# Patient Record
Sex: Female | Born: 1955
Health system: Southern US, Community
[De-identification: ages and names within clinical notes are randomized; demographics above are authoritative.]

## PROBLEM LIST (undated history)

## (undated) DIAGNOSIS — I10 Essential (primary) hypertension: Secondary | ICD-10-CM

## (undated) DIAGNOSIS — F419 Anxiety disorder, unspecified: Secondary | ICD-10-CM

## (undated) DIAGNOSIS — D259 Leiomyoma of uterus, unspecified: Secondary | ICD-10-CM

## (undated) DIAGNOSIS — T8859XA Other complications of anesthesia, initial encounter: Secondary | ICD-10-CM

## (undated) DIAGNOSIS — M797 Fibromyalgia: Secondary | ICD-10-CM

## (undated) DIAGNOSIS — K219 Gastro-esophageal reflux disease without esophagitis: Secondary | ICD-10-CM

## (undated) DIAGNOSIS — G5603 Carpal tunnel syndrome, bilateral upper limbs: Secondary | ICD-10-CM

## (undated) DIAGNOSIS — M1990 Unspecified osteoarthritis, unspecified site: Secondary | ICD-10-CM

## (undated) DIAGNOSIS — R51 Headache: Secondary | ICD-10-CM

## (undated) DIAGNOSIS — T4145XA Adverse effect of unspecified anesthetic, initial encounter: Secondary | ICD-10-CM

## (undated) DIAGNOSIS — F32A Depression, unspecified: Secondary | ICD-10-CM

## (undated) DIAGNOSIS — F329 Major depressive disorder, single episode, unspecified: Secondary | ICD-10-CM

## (undated) DIAGNOSIS — K56609 Unspecified intestinal obstruction, unspecified as to partial versus complete obstruction: Secondary | ICD-10-CM

## (undated) HISTORY — PX: CHOLECYSTECTOMY: SHX55

## (undated) HISTORY — DX: Unspecified intestinal obstruction, unspecified as to partial versus complete obstruction: K56.609

## (undated) HISTORY — DX: Essential (primary) hypertension: I10

## (undated) HISTORY — PX: HERNIA REPAIR: SHX51

## (undated) HISTORY — DX: Headache: R51

## (undated) HISTORY — PX: TUBAL LIGATION: SHX77

## (undated) HISTORY — DX: Leiomyoma of uterus, unspecified: D25.9

---
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
## 1988-10-07 HISTORY — PX: COLON RESECTION: SHX5231

## 1999-10-08 HISTORY — PX: GANGLION CYST EXCISION: SHX1691

## 1999-12-20 ENCOUNTER — Ambulatory Visit (HOSPITAL_COMMUNITY): Admission: RE | Admit: 1999-12-20 | Discharge: 1999-12-20 | Payer: Self-pay | Admitting: Pulmonary Disease

## 1999-12-20 ENCOUNTER — Encounter: Payer: Self-pay | Admitting: Pulmonary Disease

## 2000-04-16 ENCOUNTER — Encounter (INDEPENDENT_AMBULATORY_CARE_PROVIDER_SITE_OTHER): Payer: Self-pay | Admitting: Specialist

## 2000-04-16 ENCOUNTER — Ambulatory Visit (HOSPITAL_COMMUNITY): Admission: RE | Admit: 2000-04-16 | Discharge: 2000-04-16 | Payer: Self-pay | Admitting: Orthopedic Surgery

## 2001-01-29 ENCOUNTER — Emergency Department (HOSPITAL_COMMUNITY): Admission: EM | Admit: 2001-01-29 | Discharge: 2001-01-30 | Payer: Self-pay | Admitting: Emergency Medicine

## 2001-10-05 ENCOUNTER — Other Ambulatory Visit: Admission: RE | Admit: 2001-10-05 | Discharge: 2001-10-05 | Payer: Self-pay | Admitting: Gynecology

## 2002-04-10 ENCOUNTER — Encounter: Payer: Self-pay | Admitting: Emergency Medicine

## 2002-04-10 ENCOUNTER — Emergency Department (HOSPITAL_COMMUNITY): Admission: EM | Admit: 2002-04-10 | Discharge: 2002-04-10 | Payer: Self-pay | Admitting: Emergency Medicine

## 2003-07-23 ENCOUNTER — Emergency Department (HOSPITAL_COMMUNITY): Admission: EM | Admit: 2003-07-23 | Discharge: 2003-07-23 | Payer: Self-pay | Admitting: Emergency Medicine

## 2003-10-08 DIAGNOSIS — K56609 Unspecified intestinal obstruction, unspecified as to partial versus complete obstruction: Secondary | ICD-10-CM

## 2003-10-08 HISTORY — PX: OTHER SURGICAL HISTORY: SHX169

## 2003-10-08 HISTORY — DX: Unspecified intestinal obstruction, unspecified as to partial versus complete obstruction: K56.609

## 2004-02-29 ENCOUNTER — Other Ambulatory Visit: Admission: RE | Admit: 2004-02-29 | Discharge: 2004-02-29 | Payer: Self-pay | Admitting: Gynecology

## 2004-05-21 ENCOUNTER — Ambulatory Visit (HOSPITAL_BASED_OUTPATIENT_CLINIC_OR_DEPARTMENT_OTHER): Admission: RE | Admit: 2004-05-21 | Discharge: 2004-05-21 | Payer: Self-pay | Admitting: Gynecology

## 2004-05-21 ENCOUNTER — Ambulatory Visit (HOSPITAL_COMMUNITY): Admission: RE | Admit: 2004-05-21 | Discharge: 2004-05-21 | Payer: Self-pay | Admitting: Gynecology

## 2004-05-21 ENCOUNTER — Encounter (INDEPENDENT_AMBULATORY_CARE_PROVIDER_SITE_OTHER): Payer: Self-pay | Admitting: Specialist

## 2004-08-21 ENCOUNTER — Encounter: Admission: RE | Admit: 2004-08-21 | Discharge: 2004-08-21 | Payer: Self-pay | Admitting: Family Medicine

## 2005-10-21 ENCOUNTER — Other Ambulatory Visit: Admission: RE | Admit: 2005-10-21 | Discharge: 2005-10-21 | Payer: Self-pay | Admitting: Gynecology

## 2006-04-14 ENCOUNTER — Other Ambulatory Visit: Admission: RE | Admit: 2006-04-14 | Discharge: 2006-04-14 | Payer: Self-pay | Admitting: Gynecology

## 2006-11-26 ENCOUNTER — Emergency Department (HOSPITAL_COMMUNITY): Admission: EM | Admit: 2006-11-26 | Discharge: 2006-11-26 | Payer: Self-pay | Admitting: Emergency Medicine

## 2007-01-29 ENCOUNTER — Other Ambulatory Visit: Admission: RE | Admit: 2007-01-29 | Discharge: 2007-01-29 | Payer: Self-pay | Admitting: Gynecology

## 2009-02-13 ENCOUNTER — Ambulatory Visit (HOSPITAL_COMMUNITY): Admission: RE | Admit: 2009-02-13 | Discharge: 2009-02-13 | Payer: Self-pay | Admitting: Gynecology

## 2010-10-27 ENCOUNTER — Encounter: Payer: Self-pay | Admitting: Family Medicine

## 2011-02-18 ENCOUNTER — Other Ambulatory Visit: Payer: Self-pay | Admitting: Gynecology

## 2011-02-22 NOTE — Op Note (Signed)
NAME:  Diane [name], Diane [name]                     ACCOUNT NO.:  [id number]   MEDICAL RECORD NO.:  [id number]                   PATIENT TYPE:  AMB   LOCATION:  [facility]                                 FACILITY:  [facility]   PHYSICIAN:  Gretta Cool, M.D.              DATE OF BIRTH:  07/13/1956   DATE OF PROCEDURE:  DATE OF DISCHARGE:                                 OPERATIVE REPORT   PREOPERATIVE DIAGNOSES:  1. Multiple uterine leiomyomata with severe abnormal uterine bleeding.  2. Poor candidate for abdominal surgery with a history of repetitive bowel     obstruction from multiple previous procedures.   POSTOPERATIVE DIAGNOSES:  1. Multiple uterine leiomyomata with severe abnormal uterine bleeding.  2. Poor candidate for abdominal surgery with a history of repetitive bowel     obstruction from multiple previous procedures.   PROCEDURES:  1. Hysteroscopy.  2. Resection of multiple uterine leiomyomata, submucous.  3. Total endometrial resection for ablation.   SURGEON:  Gretta Cool, M.D.   ANESTHESIA:  IV sedation and paracervical block.   DESCRIPTION OF PROCEDURE:  Under excellent anesthesia as above with the  patient prepped and draped in Allen stirrups, the cervix was grasped with a  single-tooth tenaculum and then the cervix was dilated to 7 mm.  The  resectoscope was then introduced and the endometrial cavity examined.  There  were significant polyps in the apex of the fundus.  The fibroids that were  noted by ultrasound were difficult to visualize.  Once the resection of the  endometrium was undertaken, the fibroids were unroofed and the fibroids  completely enucleated from the capsule.  At least four different fibroids  were identified submucous and encroaching the cavity.  Once the entire  endometrial cavity was resected with the resectoscope loop, the cavity was  treated with VaporTrode.  The entire endometrial cavity was resected, but  the cavity was so enormous it was  difficult to visualize all areas,  particularly after resection of multiple fibroids.  At this point, the  pressure was reduced and bleeding was controlled at reduced pressure.  At  this point, the procedure was terminated without complication.  The patient  returned to the recovery room in excellent condition.                                               Gretta Cool, M.D.    CWL/MEDQ  D:  05/21/2004  T:  05/21/2004  Job:  [id number]

## 2011-02-22 NOTE — Op Note (Signed)
[facility]  Patient:    Diane [name], Diane [name]                  MRN: [id number] Proc. Date: 04/16/00 Adm. Date:  [id number] Disc. Date: [id number] Attending:  Marlowe Kays Page                           Operative Report  PREOPERATIVE DIAGNOSIS:  Painful ganglion in dorsum of right hand.  POSTOPERATIVE DIAGNOSIS:  Painful ganglion in dorsum of right hand.  OPERATION:  Excision of ganglion dorsum of right hand.  SURGEON:  Illene Labrador. Aplington, M.D.  ASSISTANT:  Nurse.  ANESTHESIA:  Regional.  INDICATIONS:  The ganglion was about 2 cm in size, located at the V interval of the second and first metacarpals.  It was painful for her.  At surgery, it was even larger than it appeared externally and appeared to take origin from the second metacarpal carpal joint.  DESCRIPTION OF PROCEDURE:  Satisfactory regional anesthesia.  Dura-Prep from mid forearm to fingertips and draped into a sterile field.  Lazy S incision down to the ganglion cyst which was dissected out from the surrounding tissues.  There was one small nerve which went right into the ganglion and was met and meshed in its mass of scar, and I was unable to separate it out from the ganglion or the surrounding neurovascular structures distally, so this had to be sacrificed to get the ganglion freed up.  Potential bleeders were coagulated with bipolar cautery.  This was a very vascular cyst.  Followed the stock down to the origin at the base of the second metacarpal and amputated that point and then cauterized the stalk and the base and placed Gelfoam in this area as I did throughout the cyst cavity.  I reapproximated the area of the cyst origin as tightly as I could, but there was not much tissue there, with interrupted 3-0 Vicryl.  Subcutaneous tissue was closed with the same. The skin closed with interrupted 4-0 mattress sutures.  Betadine and Adaptic dry sterile dressing and a volar plaster splint  with a compressive dressing were applied.  Tourniquet was released.  She tolerated the procedure well, and at the time of this dictation was on her way to the recovery room in satisfactory condition with no known complications. DD:  04/16/00 TD:  04/16/00 Job: [id number]

## 2012-02-17 ENCOUNTER — Other Ambulatory Visit: Payer: Self-pay | Admitting: Gynecology

## 2012-02-24 ENCOUNTER — Other Ambulatory Visit: Payer: Self-pay | Admitting: Gynecology

## 2012-02-24 DIAGNOSIS — Z1231 Encounter for screening mammogram for malignant neoplasm of breast: Secondary | ICD-10-CM

## 2012-03-16 ENCOUNTER — Ambulatory Visit: Payer: Self-pay

## 2012-12-31 ENCOUNTER — Ambulatory Visit (INDEPENDENT_AMBULATORY_CARE_PROVIDER_SITE_OTHER): Payer: BC Managed Care – PPO | Admitting: Diagnostic Neuroimaging

## 2012-12-31 ENCOUNTER — Encounter: Payer: Self-pay | Admitting: Diagnostic Neuroimaging

## 2012-12-31 VITALS — BP 165/83 | HR 59 | Temp 97.7°F | Ht 65.0 in | Wt 165.0 lb

## 2012-12-31 DIAGNOSIS — I10 Essential (primary) hypertension: Secondary | ICD-10-CM

## 2012-12-31 DIAGNOSIS — G4700 Insomnia, unspecified: Secondary | ICD-10-CM

## 2012-12-31 DIAGNOSIS — G43009 Migraine without aura, not intractable, without status migrainosus: Secondary | ICD-10-CM

## 2012-12-31 MED ORDER — AMITRIPTYLINE HCL 25 MG PO TABS: 25.0000 mg | ORAL_TABLET | Freq: Every day | ORAL | Status: DC

## 2012-12-31 NOTE — Patient Instructions (Addendum)
  AVOID THESE MIGRAINE TRIGGERS  Alcohol.  Smoking.  Stress.  Menstruation.  Aged cheeses.  Foods or drinks that contain nitrates, glutamate, aspartame, or tyramine.  Lack of sleep.  Chocolate.  Caffeine.  Hunger.  Physical exertion.  Fatigue.  Medicines used to treat chest pain (nitroglycerine), birth control pills, estrogen, and some blood pressure medicines.  HOME CARE INSTRUCTIONS  STOP EXCEDRIN MIGRAINE  START AMITRIPTYLINE AT NIGHT   Keep a journal to find out what may trigger your migraine headaches. For example, write down:  What you eat and drink.  How much sleep you get.  Any change to your diet or medicines.  Get 7 to 9 hours of sleep, or as recommended by your caregiver.  Limit stress.  Keep lights dim if bright lights bother you and make your migraines worse.  INSOMNIA TREATMENT   Establish a regular time to go to bed.  Counseling can help with stressful problems and worry.  Soothing music and white noise may be helpful if there are background noises you cannot remove.  Stop tedious detailed work at least one hour before bedtime.  Get out of bed if you are still awake after 15 minutes. Read or do some quiet activity. Keep the lights down. Wait until you feel sleepy and go back to bed.  Avoid distractions at bedtime. Distractions include watching television or engaging in any intense or detailed activity like attempting to balance the household checkbook.  Develop a bedtime ritual. Keep a familiar routine of bathing, brushing your teeth, climbing into bed at the same time each night, listening to soothing music. Routines increase the success of falling to sleep faster.  Use relaxation techniques. This can be using breathing and muscle tension release routines. It can also include visualizing peaceful scenes. You can also help control troubling or intruding thoughts by keeping your mind occupied with boring or repetitive thoughts like the old  concept of counting sheep. You can make it more creative like imagining planting one beautiful flower after another in your backyard garden.  During your day, work to eliminate stress. When this is not possible use some of the previous suggestions to help reduce the anxiety that accompanies stressful situations.

## 2012-12-31 NOTE — Progress Notes (Signed)
[facility]  PATIENT: Diane [name] DOB: 1956/04/12  REFERRING CLINICIAN: none HISTORY FROM: patient REASON FOR VISIT: follow up   HISTORICAL  CHIEF COMPLAINT:  Chief Complaint  Patient presents with  . Headache    HISTORY OF PRESENT ILLNESS:   UPDATE 12/31/12: since last visit, patient tried Topamax 50 mg twice a day for 3 weeks without relief. During that time she stopped her Excedrin Migraine. Patient developed itching sensation of blurred vision. Therefore patient stopped taking topiramate. She is now back to one to 2 tablets of Excedrin Migraine per day. She takes one to 2 tablets of tramadol up to 10 days per month. Patient still has significant insomnia problems. She is only getting 3-4 hours of sleep per night. Patient tries to fall asleep with her TV turned on in her bedroom.  PRIOR HPI 10/06/12: 57 year old right-handed female with history of hypertension, here for evaluation of migraines.  Patient has history of migraines for past 10 years. [facility]. She describes pounding, severe central and left-sided headaches, with nausea and phonophobia. No vomiting or photophobia. No prodromal visual or as, focal neurologic symptoms with her headaches. She has tried Imitrex, imipramine, Skelaxin in the past without significant relief. Imipramine caused hallucination and nightmare. Currently she is on metoprolol, cyclobenzaprine/tramadol one to 2 tablets every night, Excedrin Migraine 2 tablets per day, BC powder 0-2 per day. She has almost daily headaches which are quite severe and wake her up from sleep around 3:00 in the morning. She also has insomnia and poor sleep quality.  Her referring doctor has advised her to discontinue Imitrex use because of her age and postmenopausal status.  REVIEW OF SYSTEMS: Full 14 system review of systems performed and notable only for skin moles, feeling hot, feeling cold,  joint pain, aching muscles, headache, poor sleep.  ALLERGIES: No Known Allergies  HOME MEDICATIONS: Prior to Admission medications   Medication Sig Start Date End Date Taking? Authorizing Provider  Aspirin-Acetaminophen-Caffeine (MIGRAINE RELIEF PO) Take by mouth as needed.   Yes Historical Provider, MD  metoprolol (LOPRESSOR) 50 MG tablet Take 25 mg by mouth daily. 12/21/12  Yes Historical Provider, MD  traMADol (ULTRAM) 50 MG tablet Take 50 mg by mouth daily. 12/21/12  Yes Historical Provider, MD    PAST MEDICAL HISTORY: Past Medical History  Diagnosis Date  . Hypertension   . Headache     PAST SURGICAL HISTORY: History reviewed. No pertinent past surgical history.  FAMILY HISTORY: History reviewed. No pertinent family history.  SOCIAL HISTORY:  History   Social History  . Marital Status: Single    Spouse Name: N/A    Number of Children: N/A  . Years of Education: N/A   Occupational History  .      [employer]   Social History Main Topics  . Smoking status: Former Smoker    Quit date: 10/08/1987  . Smokeless tobacco: Not on file  . Alcohol Use: Yes     Comment: 1 glass of wine occasionally  . Drug Use: No  . Sexually Active: Not on file   Other Topics Concern  . Not on file   Social History Narrative   Pt lives at home with her husband, daughter, and 2 grandchildren. She has four children and drinks one cup of coffee daily.      PHYSICAL EXAM  Filed Vitals:   12/31/12 1517  BP: 165/83  Pulse: 59  Temp: 97.7 F (36.5 C)  TempSrc: Oral  Height: 5\' 5"  (1.651 m)  Weight: 165 lb (74.844 kg)   Body mass index is 27.46 kg/(m^2).  GENERAL EXAM: Patient is in no distress  CARDIOVASCULAR: Regular rate and rhythm, no murmurs, no carotid bruits  NEUROLOGIC: MENTAL STATUS: awake, alert, language fluent, comprehension intact, naming intact; SLIGHTLY DEPRESSED AFFECT. CRANIAL NERVE: no papilledema on fundoscopic exam, pupils equal and reactive to light, visual  fields full to confrontation, extraocular muscles intact, no nystagmus, facial sensation and strength symmetric, uvula midline, shoulder shrug symmetric, tongue midline. MOTOR: normal bulk and tone, full strength in the BUE, BLE SENSORY: normal and symmetric to light touch, pinprick, temperature, vibration COORDINATION: finger-nose-finger, fine finger movements normal REFLEXES: deep tendon reflexes present and symmetric GAIT/STATION: narrow based gait; able to walk on toes, heels and tandem; romberg is negative   DIAGNOSTIC DATA (LABS, IMAGING, TESTING) - I reviewed patient records, labs, notes, testing and imaging myself where available.  No results found for this basename: WBC,  HGB,  HCT,  MCV,  PLT   No results found for this basename: na,  k,  cl,  co2,  glucose,  bun,  creatinine,  calcium,  prot,  albumin,  ast,  alt,  alkphos,  bilitot,  gfrnonaa,  gfraa   No results found for this basename: CHOL,  HDL,  LDLCALC,  LDLDIRECT,  TRIG,  CHOLHDL   No results found for this basename: HGBA1C   No results found for this basename: VITAMINB12   No results found for this basename: TSH    ASSESSMENT AND PLAN  57 y.o. year old female  has a past medical history of Hypertension and Headache. here with:   Dx: chronic migraine + analgesic overuse HA + insomnia  PLAN: 1. Trial of amitriptyline (may help headaches and insomnia) 2. Stop excedrin migraine 3. Sleep hygeine strategies reviewed 4. Needs to establish with new PCP for BP control  Suanne Marker, MD 12/31/2012, 4:10 PM Certified in Neurology, Neurophysiology and Neuroimaging  [facility] [address] [place], [place] [address] [phone]

## 2013-01-13 NOTE — Progress Notes (Deleted)
This encounter was created in error - please disregard.

## 2013-02-16 ENCOUNTER — Emergency Department (HOSPITAL_COMMUNITY): Payer: BC Managed Care – PPO

## 2013-02-16 ENCOUNTER — Encounter (HOSPITAL_COMMUNITY): Payer: Self-pay | Admitting: Emergency Medicine

## 2013-02-16 ENCOUNTER — Emergency Department (HOSPITAL_COMMUNITY)
Admission: EM | Admit: 2013-02-16 | Discharge: 2013-02-16 | Disposition: A | Discharge status: Home / Self Care | Payer: BC Managed Care – PPO | Attending: Emergency Medicine | Admitting: Emergency Medicine

## 2013-02-16 DIAGNOSIS — M549 Dorsalgia, unspecified: Secondary | ICD-10-CM | POA: Insufficient documentation

## 2013-02-16 DIAGNOSIS — Z79899 Other long term (current) drug therapy: Secondary | ICD-10-CM | POA: Insufficient documentation

## 2013-02-16 DIAGNOSIS — N12 Tubulo-interstitial nephritis, not specified as acute or chronic: Secondary | ICD-10-CM | POA: Insufficient documentation

## 2013-02-16 DIAGNOSIS — R109 Unspecified abdominal pain: Secondary | ICD-10-CM | POA: Insufficient documentation

## 2013-02-16 DIAGNOSIS — E876 Hypokalemia: Secondary | ICD-10-CM

## 2013-02-16 DIAGNOSIS — R05 Cough: Secondary | ICD-10-CM | POA: Insufficient documentation

## 2013-02-16 DIAGNOSIS — R110 Nausea: Secondary | ICD-10-CM | POA: Insufficient documentation

## 2013-02-16 DIAGNOSIS — R059 Cough, unspecified: Secondary | ICD-10-CM | POA: Insufficient documentation

## 2013-02-16 DIAGNOSIS — R300 Dysuria: Secondary | ICD-10-CM | POA: Insufficient documentation

## 2013-02-16 DIAGNOSIS — R350 Frequency of micturition: Secondary | ICD-10-CM | POA: Insufficient documentation

## 2013-02-16 DIAGNOSIS — IMO0001 Reserved for inherently not codable concepts without codable children: Secondary | ICD-10-CM | POA: Insufficient documentation

## 2013-02-16 DIAGNOSIS — I10 Essential (primary) hypertension: Secondary | ICD-10-CM | POA: Insufficient documentation

## 2013-02-16 DIAGNOSIS — R509 Fever, unspecified: Secondary | ICD-10-CM | POA: Insufficient documentation

## 2013-02-16 DIAGNOSIS — Z87891 Personal history of nicotine dependence: Secondary | ICD-10-CM | POA: Insufficient documentation

## 2013-02-16 LAB — CBC WITH DIFFERENTIAL/PLATELET
Basophils Absolute: 0 10*3/uL (ref 0.0–0.1)
Basophils Relative: 0 % (ref 0–1)
Eosinophils Absolute: 0 10*3/uL (ref 0.0–0.7)
HCT: 38.1 % (ref 36.0–46.0)
Hemoglobin: 13.3 g/dL (ref 12.0–15.0)
MCH: 28.7 pg (ref 26.0–34.0)
MCHC: 34.9 g/dL (ref 30.0–36.0)
Monocytes Absolute: 0.4 10*3/uL (ref 0.1–1.0)
Monocytes Relative: 7 % (ref 3–12)
Neutro Abs: 4 10*3/uL (ref 1.7–7.7)
Neutrophils Relative %: 78 % — ABNORMAL HIGH (ref 43–77)
RDW: 12.4 % (ref 11.5–15.5)

## 2013-02-16 LAB — COMPREHENSIVE METABOLIC PANEL
AST: 42 U/L — ABNORMAL HIGH (ref 0–37)
Albumin: 3.8 g/dL (ref 3.5–5.2)
BUN: 7 mg/dL (ref 6–23)
Chloride: 101 mEq/L (ref 96–112)
Creatinine, Ser: 0.61 mg/dL (ref 0.50–1.10)
Total Protein: 7.1 g/dL (ref 6.0–8.3)

## 2013-02-16 LAB — URINE MICROSCOPIC-ADD ON

## 2013-02-16 LAB — URINALYSIS, ROUTINE W REFLEX MICROSCOPIC
Glucose, UA: NEGATIVE mg/dL
Specific Gravity, Urine: 1.018 (ref 1.005–1.030)
pH: 7.5 (ref 5.0–8.0)

## 2013-02-16 MED ORDER — POTASSIUM CHLORIDE 10 MEQ/100ML IV SOLN
10.0000 meq | INTRAVENOUS | Status: AC
  Administered 2013-02-16: 10 meq via INTRAVENOUS
  Filled 2013-02-16 (×2): qty 100

## 2013-02-16 MED ORDER — ACETAMINOPHEN 325 MG PO TABS
650.0000 mg | ORAL_TABLET | Freq: Once | ORAL | Status: AC
  Administered 2013-02-16: 650 mg via ORAL
  Filled 2013-02-16: qty 2

## 2013-02-16 MED ORDER — POTASSIUM CHLORIDE ER 10 MEQ PO TBCR: 20.0000 meq | EXTENDED_RELEASE_TABLET | Freq: Two times a day (BID) | ORAL | Status: DC

## 2013-02-16 MED ORDER — OXYCODONE-ACETAMINOPHEN 5-325 MG PO TABS: ORAL_TABLET | ORAL | Status: DC

## 2013-02-16 MED ORDER — DEXTROSE 5 % IV SOLN
1.0000 g | INTRAVENOUS | Status: DC
Start: 2013-02-16 — End: 2013-02-16
  Administered 2013-02-16: 1 g via INTRAVENOUS
  Filled 2013-02-16: qty 10

## 2013-02-16 MED ORDER — ONDANSETRON HCL 4 MG/2ML IJ SOLN
4.0000 mg | Freq: Once | INTRAMUSCULAR | Status: AC
  Administered 2013-02-16: 4 mg via INTRAVENOUS
  Filled 2013-02-16: qty 2

## 2013-02-16 MED ORDER — MORPHINE SULFATE 4 MG/ML IJ SOLN
4.0000 mg | Freq: Once | INTRAMUSCULAR | Status: AC
  Administered 2013-02-16: 4 mg via INTRAVENOUS
  Filled 2013-02-16: qty 1

## 2013-02-16 MED ORDER — POTASSIUM CHLORIDE CRYS ER 20 MEQ PO TBCR
40.0000 meq | EXTENDED_RELEASE_TABLET | Freq: Once | ORAL | Status: AC
  Administered 2013-02-16: 40 meq via ORAL
  Filled 2013-02-16: qty 2

## 2013-02-16 MED ORDER — SODIUM CHLORIDE 0.9 % IV BOLUS (SEPSIS)
1000.0000 mL | Freq: Once | INTRAVENOUS | Status: AC
  Administered 2013-02-16: 1000 mL via INTRAVENOUS

## 2013-02-16 MED ORDER — CEPHALEXIN 500 MG PO CAPS: 500.0000 mg | ORAL_CAPSULE | Freq: Two times a day (BID) | ORAL | Status: DC

## 2013-02-16 MED ORDER — CEPHALEXIN 500 MG PO CAPS: 500.0000 mg | ORAL_CAPSULE | Freq: Four times a day (QID) | ORAL | Status: DC

## 2013-02-16 NOTE — ED Notes (Signed)
Verified with pharmacy that Rocephin is compatible with Potassium IV.

## 2013-02-16 NOTE — ED Provider Notes (Signed)
History     CSN: [id number]  Arrival date & time 02/16/13  0551   First MD Initiated Contact with Patient 02/16/13 [phone]      Chief Complaint  Patient presents with  . Generalized Body Aches    (Consider location/radiation/quality/duration/timing/severity/associated sxs/prior treatment) HPI  Diane [name] is a 57 y.o. female complaining of fever, chills, dry cough, myalgia, dysuria, nausea urinary frequency worsening over the course last 4 days. Patient denies sick contacts, travel, chest pain, shortness of breath, emesis, neck stiffness or cervicalgia, change in bowel habits. When queried patient endorses a suprapubic abdominal discomfort, headache it is typical for her migraines (frontal, 8/10, no photo or phonophobia). Patient denies rash but she does notice bruising to the left arm she states that sometimes she just gets bruises and doesn't know why she gets them they're usually on the lower tremor is rather than the upper extremities however.   Past Medical History  Diagnosis Date  . Hypertension   . Headache     History reviewed. No pertinent past surgical history.  History reviewed. No pertinent family history.  History  Substance Use Topics  . Smoking status: Former Smoker    Quit date: 10/08/1987  . Smokeless tobacco: Not on file  . Alcohol Use: Yes     Comment: 1 glass of wine occasionally    OB History   Grav Para Term Preterm Abortions TAB SAB Ect Mult Living                  Review of Systems  Constitutional: Positive for fever and chills.  Respiratory: Negative for shortness of breath.   Cardiovascular: Negative for chest pain.  Gastrointestinal: Positive for nausea and abdominal pain. Negative for vomiting and diarrhea.  Musculoskeletal: Positive for back pain.  All other systems reviewed and are negative.    Allergies  Topamax  Home Medications   Current Outpatient Rx  Name  Route  Sig  Dispense  Refill  . amitriptyline (ELAVIL) 25 MG  tablet   Oral   Take 1 tablet (25 mg total) by mouth at bedtime.   30 tablet   3   . Aspirin-Acetaminophen-Caffeine (MIGRAINE RELIEF PO)   Oral   Take by mouth as needed.         . metoprolol (LOPRESSOR) 50 MG tablet   Oral   Take 25 mg by mouth daily.         . traMADol (ULTRAM) 50 MG tablet   Oral   Take 50 mg by mouth daily.           BP 140/76  Pulse 95  Temp(Src) 101.3 F (38.5 C) (Oral)  Resp 18  SpO2 96%  Physical Exam  Nursing note and vitals reviewed. Constitutional: She is oriented to person, place, and time. She appears well-developed and well-nourished. No distress.  HENT:  Head: Normocephalic.  Mouth/Throat: Oropharynx is clear and moist.  No conjunctival pallor  Eyes: Conjunctivae and EOM are normal. Pupils are equal, round, and reactive to light.  Neck: Normal range of motion. No tracheal deviation present.  Patient has full range of motion to neck. No tenderness to deep palpation of the posterior cervical spine. Patient can flex chin to chest with no pain.  Cardiovascular: Normal rate, regular rhythm and intact distal pulses.  Exam reveals no gallop and no friction rub.   No murmur heard. Pulmonary/Chest: Effort normal and breath sounds normal. No stridor. No respiratory distress. She has no wheezes. She has no  rales. She exhibits no tenderness.  Abdominal: Soft. Bowel sounds are normal. She exhibits no distension and no mass. There is tenderness. There is no rebound and no guarding.  Mild suprapubic tenderness  Genitourinary:  Mild right CVA tenderness  Musculoskeletal: Normal range of motion. She exhibits no edema.  Neurological: She is alert and oriented to person, place, and time.  Follows commands, Goal oriented speech, Strength is 5 out of 5x4 extremities, patient ambulates with a coordinated in nonantalgic gait. Sensation is grossly intact.  Skin:  Bruising to right biceps area. No petechial rashes.  Psychiatric: She has a normal mood and  affect.    ED Course  Procedures (including critical care time)  Labs Reviewed  CBC WITH DIFFERENTIAL - Abnormal; Notable for the following:    Neutrophils Relative 78 (*)    All other components within normal limits  COMPREHENSIVE METABOLIC PANEL - Abnormal; Notable for the following:    Potassium 2.8 (*)    Glucose, Bld 105 (*)    AST 42 (*)    All other components within normal limits  URINALYSIS, ROUTINE W REFLEX MICROSCOPIC - Abnormal; Notable for the following:    Hgb urine dipstick SMALL (*)    Leukocytes, UA MODERATE (*)    All other components within normal limits  URINE MICROSCOPIC-ADD ON - Abnormal; Notable for the following:    Bacteria, UA FEW (*)    All other components within normal limits  URINE CULTURE   Dg Chest 2 View  02/16/2013  *RADIOLOGY REPORT*  Clinical Data: Headache and fever.  CHEST - 2 VIEW  Comparison: None.  Findings: The lungs are clear.  Heart size is normal.  No pneumothorax or pleural fluid.  IMPRESSION: Negative chest.   Original Report Authenticated By: Holley Dexter, M.D.     Date: 02/16/2013  Rate: 80  Rhythm: normal sinus rhythm  QRS Axis: left  Intervals: normal  ST/T Wave abnormalities: normal  Conduction Disutrbances:nonspecific intraventricular conduction delay  Narrative Interpretation:   Old EKG Reviewed: none available   1. Hypokalemia   2. Pyelonephritis       MDM   Filed Vitals:   02/16/13 0603 02/16/13 0920  BP: 140/76 119/74  Pulse: 95   Temp: 101.3 F (38.5 C) 98.5 F (36.9 C)  TempSrc: Oral Oral  Resp: 18 17  SpO2: 96% 97%     Diane [name] is a 57 y.o. female with fever, chills, dysuria, frequency, dry cough, myalgia. Patient is not need SIRScriteria based on vitals alone. Blood work, UA pending.  UA is c/w infection, I will start treatment for pyelonephritis. Patient has a hypokalemia of 2.8. I will treat replete her with 40 mEq by mouth and 2 runs of 10 mEq IV. EKG pending. Chest x-ray shows  no acute abnormalities. Patient has no leukocytosis.   Discussed case with attending who agrees with plan and stability to d/c to home.   VSS and patient is appropriate for, and amenable to, discharge at this time. Pt verbalized understanding and agrees with care plan. Outpatient follow-up and return precautions given.   Medications  potassium chloride 10 mEq in 100 mL IVPB (10 mEq Intravenous New Bag/Given 02/16/13 0909)  cefTRIAXone (ROCEPHIN) 1 g in dextrose 5 % 50 mL IVPB (1 g Intravenous New Bag/Given 02/16/13 0932)  acetaminophen (TYLENOL) tablet 650 mg (650 mg Oral Given 02/16/13 0653)  sodium chloride 0.9 % bolus 1,000 mL (0 mLs Intravenous Stopped 02/16/13 0858)  sodium chloride 0.9 % bolus 1,000 mL (  0 mLs Intravenous Stopped 02/16/13 0858)  morphine 4 MG/ML injection 4 mg (4 mg Intravenous Given 02/16/13 0658)  ondansetron (ZOFRAN) injection 4 mg (4 mg Intravenous Given 02/16/13 0658)  potassium chloride SA (K-DUR,KLOR-CON) CR tablet 40 mEq (40 mEq Oral Given 02/16/13 0907)  sodium chloride 0.9 % bolus 1,000 mL (1,000 mLs Intravenous New Bag/Given 02/16/13 0907)    New Prescriptions   CEPHALEXIN (KEFLEX) 500 MG CAPSULE    Take 1 capsule (500 mg total) by mouth 4 (four) times daily.   OXYCODONE-ACETAMINOPHEN (PERCOCET/ROXICET) 5-325 MG PER TABLET    1 to 2 tabs PO q6hrs  PRN for pain   POTASSIUM CHLORIDE (K-DUR) 10 MEQ TABLET    Take 2 tablets (20 mEq total) by mouth 2 (two) times daily.           Wynetta Emery, PA-C 02/16/13 0945

## 2013-02-16 NOTE — ED Notes (Signed)
Spoke with [place], [place]- the 2nd potassium IV order expired; PA is okay not to give second run of K

## 2013-02-16 NOTE — ED Notes (Signed)
Patient presents to ED with c/o generalized body pain--- head, hypogastric area of abdomen, right hip; pt states pain the worst in her right hip down to right lower leg; pt also states "I feel nauseous sometimes but no vomiting"; states pain started last Saturday, "It's just getting worse".

## 2013-02-16 NOTE — ED Provider Notes (Signed)
Medical screening examination/treatment/procedure(s) were performed by non-physician practitioner and as supervising physician I was immediately available for consultation/collaboration.   Hanley Seamen, MD 02/16/13 2240

## 2013-02-17 LAB — URINE CULTURE: Colony Count: 8000

## 2013-02-21 ENCOUNTER — Encounter (HOSPITAL_COMMUNITY): Payer: Self-pay | Admitting: *Deleted

## 2013-02-21 ENCOUNTER — Emergency Department (HOSPITAL_COMMUNITY)
Admission: EM | Admit: 2013-02-21 | Discharge: 2013-02-21 | Disposition: A | Discharge status: Home / Self Care | Payer: BC Managed Care – PPO | Source: Home / Self Care

## 2013-02-21 DIAGNOSIS — J302 Other seasonal allergic rhinitis: Secondary | ICD-10-CM

## 2013-02-21 DIAGNOSIS — J309 Allergic rhinitis, unspecified: Secondary | ICD-10-CM

## 2013-02-21 LAB — POCT I-STAT, CHEM 8
BUN: 8 mg/dL (ref 6–23)
Chloride: 105 mEq/L (ref 96–112)
Glucose, Bld: 88 mg/dL (ref 70–99)
HCT: 42 % (ref 36.0–46.0)
Potassium: 4.1 mEq/L (ref 3.5–5.1)

## 2013-02-21 LAB — POCT URINALYSIS DIP (DEVICE)
Bilirubin Urine: NEGATIVE
Glucose, UA: NEGATIVE mg/dL
Ketones, ur: NEGATIVE mg/dL
Nitrite: NEGATIVE
Specific Gravity, Urine: 1.015 (ref 1.005–1.030)

## 2013-02-21 MED ORDER — FLUTICASONE PROPIONATE 50 MCG/ACT NA SUSP: 1.0000 | Freq: Two times a day (BID) | NASAL | Status: DC

## 2013-02-21 MED ORDER — HYDROCOD POLST-CHLORPHEN POLST 10-8 MG/5ML PO LQCR: 5.0000 mL | Freq: Two times a day (BID) | ORAL | Status: DC | PRN

## 2013-02-21 MED ORDER — CETIRIZINE HCL 10 MG PO TABS: 10.0000 mg | ORAL_TABLET | Freq: Every day | ORAL | Status: DC

## 2013-02-21 NOTE — ED Notes (Addendum)
Patient presents for hospital follow up for hypokalemia and pyelonephritis. Patient states she is feeling bad and throat is hurting. States nausea. Patient states a lot of chest congestion and coughing. States throat feels like it is closing when she sleeps.

## 2013-02-21 NOTE — ED Provider Notes (Signed)
History     CSN: [id number]  Arrival date & time 02/21/13  1131   None     Chief Complaint  Patient presents with  . Hospitalization Follow-up    patient was hospitalized for hopy kalemia and pyelonephritis.    (Consider location/radiation/quality/duration/timing/severity/associated sxs/prior treatment) Patient is a 57 y.o. female presenting with general illness. The history is provided by the patient.  Illness  The current episode started 3 to 5 days ago (seen for hypokalemia and pyelo in ER on 5/13 here for recheck but also persistent  cough.). The problem has been gradually worsening. The problem is mild. Associated symptoms include congestion, rhinorrhea and cough. Pertinent negatives include no fever and no wheezing.    Past Medical History  Diagnosis Date  . Hypertension   . Headache     History reviewed. No pertinent past surgical history.  No family history on file.  History  Substance Use Topics  . Smoking status: Former Smoker    Quit date: 10/08/1987  . Smokeless tobacco: Not on file  . Alcohol Use: Yes     Comment: 1 glass of wine occasionally    OB History   Grav Para Term Preterm Abortions TAB SAB Ect Mult Living                  Review of Systems  Constitutional: Negative for fever and chills.  HENT: Positive for congestion, rhinorrhea and postnasal drip.   Respiratory: Positive for cough. Negative for shortness of breath and wheezing.   Gastrointestinal: Negative.     Allergies  Topamax  Home Medications   Current Outpatient Rx  Name  Route  Sig  Dispense  Refill  . amitriptyline (ELAVIL) 25 MG tablet   Oral   Take 1 tablet (25 mg total) by mouth at bedtime.   30 tablet   3   . cephALEXin (KEFLEX) 500 MG capsule   Oral   Take 1 capsule (500 mg total) by mouth 4 (four) times daily.   20 capsule   0   . metoprolol (LOPRESSOR) 50 MG tablet   Oral   Take 25 mg by mouth daily.         . potassium chloride (K-DUR) 10 MEQ tablet   Oral   Take 2 tablets (20 mEq total) by mouth 2 (two) times daily.   6 tablet   0   . Aspirin-Acetaminophen-Caffeine (MIGRAINE RELIEF PO)   Oral   Take by mouth as needed.         . cetirizine (ZYRTEC) 10 MG tablet   Oral   Take 1 tablet (10 mg total) by mouth daily. One tab daily for allergies   30 tablet   1   . chlorpheniramine-HYDROcodone (TUSSIONEX PENNKINETIC ER) 10-8 MG/5ML LQCR   Oral   Take 5 mLs by mouth every 12 (twelve) hours as needed. For cough   115 mL   0   . fluticasone (FLONASE) 50 MCG/ACT nasal spray   Nasal   Place 1 spray into the nose 2 (two) times daily.   1 g   2   . oxyCODONE-acetaminophen (PERCOCET/ROXICET) 5-325 MG per tablet      1 to 2 tabs PO q6hrs  PRN for pain   15 tablet   0   . traMADol (ULTRAM) 50 MG tablet   Oral   Take 50 mg by mouth daily.           BP 160/101  Pulse 68  Temp(Src) 98.3 F (  36.8 C) (Oral)  Resp 17  SpO2 98%  Physical Exam  Nursing note and vitals reviewed. Constitutional: She is oriented to person, place, and time. She appears well-developed and well-nourished.  HENT:  Head: Normocephalic.  Right Ear: External ear normal.  Left Ear: External ear normal.  Mouth/Throat: Oropharynx is clear and moist.  Eyes: Conjunctivae are normal. Pupils are equal, round, and reactive to light.  Neck: Normal range of motion. Neck supple.  Cardiovascular: Normal rate, regular rhythm, normal heart sounds and intact distal pulses.   Pulmonary/Chest: Effort normal and breath sounds normal. She has no wheezes. She has no rales.  Lymphadenopathy:    She has no cervical adenopathy.  Neurological: She is alert and oriented to person, place, and time.  Skin: Skin is warm and dry.    ED Course  Procedures (including critical care time)  Labs Reviewed  POCT URINALYSIS DIP (DEVICE) - Abnormal; Notable for the following:    Hgb urine dipstick SMALL (*)    Leukocytes, UA TRACE (*)    All other components within normal  limits  POCT I-STAT, CHEM 8 - Abnormal; Notable for the following:    Calcium, Ion 1.26 (*)    All other components within normal limits   No results found.   1. Seasonal allergic reaction       MDM  K+ and u/a improved.        Linna Hoff, MD 02/21/13 1236

## 2013-02-22 ENCOUNTER — Telehealth: Payer: Self-pay | Admitting: Diagnostic Neuroimaging

## 2013-05-26 NOTE — Progress Notes (Signed)
This encounter was created in error - please disregard.

## 2013-05-26 NOTE — Progress Notes (Signed)
This encounter was created in error - please disregard.  This encounter was created in error - please disregard.

## 2013-07-25 ENCOUNTER — Other Ambulatory Visit: Payer: Self-pay | Admitting: Diagnostic Neuroimaging

## 2013-11-25 NOTE — Telephone Encounter (Signed)
Pt never called for apt closing encounter

## 2015-01-27 ENCOUNTER — Ambulatory Visit (INDEPENDENT_AMBULATORY_CARE_PROVIDER_SITE_OTHER): Payer: BLUE CROSS/BLUE SHIELD | Admitting: Family Medicine

## 2015-01-27 ENCOUNTER — Encounter: Payer: Self-pay | Admitting: Family Medicine

## 2015-01-27 VITALS — BP 159/97 | HR 72 | Temp 97.9°F | Resp 16 | Ht 64.5 in | Wt 166.0 lb

## 2015-01-27 DIAGNOSIS — I10 Essential (primary) hypertension: Secondary | ICD-10-CM

## 2015-01-27 DIAGNOSIS — R252 Cramp and spasm: Secondary | ICD-10-CM

## 2015-01-27 DIAGNOSIS — R110 Nausea: Secondary | ICD-10-CM

## 2015-01-27 DIAGNOSIS — R12 Heartburn: Secondary | ICD-10-CM

## 2015-01-27 DIAGNOSIS — G43009 Migraine without aura, not intractable, without status migrainosus: Secondary | ICD-10-CM

## 2015-01-27 LAB — POCT URINALYSIS DIPSTICK
Bilirubin, UA: NEGATIVE
Glucose, UA: NEGATIVE
KETONES UA: NEGATIVE
NITRITE UA: NEGATIVE
PH UA: 7
PROTEIN UA: NEGATIVE
Spec Grav, UA: 1.015
Urobilinogen, UA: 0.2

## 2015-01-27 MED ORDER — SUMATRIPTAN SUCCINATE 50 MG PO TABS: 50.0000 mg | ORAL_TABLET | Freq: Once | ORAL | Status: DC

## 2015-01-27 MED ORDER — AMITRIPTYLINE HCL 10 MG PO TABS: 10.0000 mg | ORAL_TABLET | Freq: Every day | ORAL | Status: DC

## 2015-01-27 MED ORDER — VALSARTAN 40 MG PO TABS: 40.0000 mg | ORAL_TABLET | Freq: Three times a day (TID) | ORAL | Status: DC

## 2015-01-27 MED ORDER — PROMETHAZINE HCL 25 MG PO TABS: 25.0000 mg | ORAL_TABLET | Freq: Three times a day (TID) | ORAL | Status: DC | PRN

## 2015-01-27 MED ORDER — LISINOPRIL 10 MG PO TABS: 10.0000 mg | ORAL_TABLET | Freq: Every day | ORAL | Status: DC

## 2015-01-27 MED ORDER — KETOROLAC TROMETHAMINE 60 MG/2ML IM SOLN
60.0000 mg | Freq: Once | INTRAMUSCULAR | Status: AC
Start: 2015-01-27 — End: 2015-01-27
  Administered 2015-01-27: 60 mg via INTRAMUSCULAR

## 2015-01-27 MED ORDER — PROMETHAZINE HCL 25 MG/ML IJ SOLN
25.0000 mg | Freq: Once | INTRAMUSCULAR | Status: AC
  Administered 2015-01-27: 25 mg via INTRAMUSCULAR

## 2015-01-27 NOTE — Progress Notes (Addendum)
Subjective:    Patient ID: Diane [name], female    DOB: 1955/11/29, 59 y.o.   MRN: [id number] This chart was scribed for Diane Cheadle, MD by Diane [name], Medical Scribe. This patient was seen in Room 25 and the patient's care was started at 4:32 PM.   Chief Complaint  Patient presents with  . Headache  . Nausea  . Knee Pain    left knee    HPI HPI Comments: Diane [name] is a 60 y.o. female with a hx of HTN and migraine headache who presents to the [facility] with multiple concerns, including migraine headache, nausea and right leg cramps.  Hypertension: Patient states the metoprolol makes her fatigued and "feel like a zombie." She has tried only benazepril in the past, but she states she could not function on it. She does measure her blood pressure at home. She had been seeing Diane [name] in the past until he retired.  Migraine Headache: Patient has had severe headaches almost daily that are typically worse at night and wake her up from sleep sometimes. She does not know the cause of her migraine headaches, but she does not think the headaches are related to her blood pressure. She had been going to the [facility] in the past, but she states it was not helpful so she stopped going. She notes that her blood pressure medication has helped slightly with her headaches. Patient notes that she was taking Imitrex (about 3 times a week) in the past for headache, but was discontinued from it for concerns of increased CVA risk. She had also been on Zomig and Topamax in the past, but she states the Zomig was not as effective as Imitrex, and Topamax made her itch. Patient had also been put on tramadol in the past for headache. She has not taken any OTC pain medications, including ibuprofen and Aleve today. She cannot remember the last day she did not have a headache, but she thinks it may have been last month.  Nausea: Patient reports having constant nausea for  the past 3 weeks. She has been noticing a bitter taste in her mouth recently. She has tried Copywriter, advertising, Tums and her husband's nausea medication (Zofran, she took one) but with relief. She has not used any other medications on her husband's medication list. Patient notes she cannot drink coffee due to the nausea. Patient denies chest pain, vomiting and heartburn/indigestion. She also denies EtOH use and possibility of pregnancy. She reports drinking 1-2 bottles of water per day.  Right Leg Cramps: Patient reports having intermittent right leg cramps, about every 3 weeks.  Past Medical History  Diagnosis Date  . Hypertension   . PNTIRWER(154.0)    Current Outpatient Prescriptions on File Prior to Visit  Medication Sig Dispense Refill  . metoprolol (LOPRESSOR) 50 MG tablet Take 25 mg by mouth daily.     No current facility-administered medications on file prior to visit.   Allergies  Allergen Reactions  . Topamax [Topiramate] Hives     Review of Systems  Constitutional: Positive for activity change, appetite change and fatigue. Negative for fever, chills, diaphoresis and unexpected weight change.  Cardiovascular: Negative for chest pain.  Gastrointestinal: Positive for nausea. Negative for vomiting.  Genitourinary: Positive for flank pain.  Musculoskeletal: Positive for myalgias, back pain and arthralgias.  Neurological: Positive for headaches.  Psychiatric/Behavioral: Positive for sleep disturbance.       Objective:  BP 159/97 mmHg  [name]  72  Temp(Src) 97.9 F (36.6 C)  Resp 16  Ht 5' 4.5" (1.638 m)  Wt 166 lb (75.297 kg)  BMI 28.06 kg/m2  SpO2 98%  Physical Exam  Constitutional: She is oriented to person, place, and time. She appears well-developed and well-nourished. No distress.  HENT:  Head: Normocephalic and atraumatic.  Right Ear: Tympanic membrane normal.  Left Ear: Tympanic membrane normal.  Nose: Nose normal.  Mouth/Throat: Posterior oropharyngeal erythema  present. No oropharyngeal exudate.  Moderate oropharyngeal erythema.  Eyes: Pupils are equal, round, and reactive to light.  Neck: Neck supple.  Cardiovascular: Normal rate, regular rhythm, S1 normal, S2 normal and normal heart sounds.   No murmur heard. Pulmonary/Chest: Effort normal and breath sounds normal. No respiratory distress. She has no wheezes. She has no rales.  CTAB.  Musculoskeletal: She exhibits no edema.  Neurological: She is alert and oriented to person, place, and time. No cranial nerve deficit.  Skin: Skin is warm and dry. No rash noted.  Psychiatric: She has a normal mood and affect. Her behavior is normal.  Vitals reviewed.  [facility] reading (PRIMARY) by  Diane [name]. EKG: NSR, no acute ischemic change    Assessment & Plan:  Propranolol may be good for migraine prevention and reduce anxiety, but as patient had severe fatigue with metoprolol, will not try first line in addition should not be used in patients over 60. Propranolol should be titrated up to 160 mg daily if tolerated and maintained for 3 months, can take several weeks to take effect. Lisinopril and verapamil will be started since patient's blood pressure suggests she will need 2-3 agents for control. Since she reports not tolerating benazepril, will start low dose lisinopril, but will increase to 20 mg in one week if tolerated. If she does not tolerate it, she can try ARB instead. Will also start amitriptaline at bedtime, titrate up to 50 mg. If that does not work, may want to try venlafaxine.    1. Migraine without aura and without status migrainosus, not intractable   2. Essential hypertension   3. Nausea without vomiting   4. Waterbrash   5. Muscle cramping     Orders Placed This Encounter  Procedures  . Urine culture  . TSH  . Vit D  25 hydroxy (rtn osteoporosis monitoring)  . Comprehensive metabolic panel  . CBC with Differential/Platelet  . Sedimentation Rate  . CK  . Lipase  . Magnesium  . POCT  urinalysis dipstick  . EKG 12-Lead    Meds ordered this encounter  Medications  . promethazine (PHENERGAN) injection 25 mg    Sig:   . ketorolac (TORADOL) injection 60 mg    Sig:   . lisinopril (PRINIVIL,ZESTRIL) 10 MG tablet    Sig: Take 1 tablet (10 mg total) by mouth daily.    Dispense:  30 tablet    Refill:  0  . valsartan (DIOVAN) 40 MG tablet    Sig: Take 1 tablet (40 mg total) by mouth 3 (three) times daily.    Dispense:  90 tablet    Refill:  0  . amitriptyline (ELAVIL) 10 MG tablet    Sig: Take 1 tablet (10 mg total) by mouth at bedtime.    Dispense:  30 tablet    Refill:  0  . promethazine (PHENERGAN) 25 MG tablet    Sig: Take 1 tablet (25 mg total) by mouth every 8 (eight) hours as needed for nausea or vomiting.    Dispense:  20  tablet    Refill:  0  . SUMAtriptan (IMITREX) 50 MG tablet    Sig: Take 1 tablet (50 mg total) by mouth once. May repeat in 2 hours if headache persists or recurs.    Dispense:  10 tablet    Refill:  0    I personally performed the services described in this documentation, which was scribed in my presence. The recorded information has been reviewed and considered, and addended by me as needed.  Diane Cheadle, MD MPH

## 2015-01-27 NOTE — Patient Instructions (Signed)
All 3 medications we started today should help with your migraines.  The lisinopril in the morning and the valsartan with breakfast, lunch, and dinner are blood pressure medicines.  Then the amitriptyline is a medicine to help you sleep and reduce muscle cramps in the evening.  If you are nauseated, you can take the promethazine (which you were given in a shot today) but this will make you very sleepy.  Recurrent Migraine Headache A migraine headache is an intense, throbbing pain on one or both sides of your head. Recurrent migraines keep coming back. A migraine can last for 30 minutes to several hours. CAUSES  The exact cause of a migraine headache is not always known. However, a migraine may be caused when nerves in the brain become irritated and release chemicals that cause inflammation. This causes pain. Certain things may also trigger migraines, such as:   Alcohol.  Smoking.  Stress.  Menstruation.  Aged cheeses.  Foods or drinks that contain nitrates, glutamate, aspartame, or tyramine.  Lack of sleep.  Chocolate.  Caffeine.  Hunger.  Physical exertion.  Fatigue.  Medicines used to treat chest pain (nitroglycerine), birth control pills, estrogen, and some blood pressure medicines. SYMPTOMS   Pain on one or both sides of your head.  Pulsating or throbbing pain.  Severe pain that prevents daily activities.  Pain that is aggravated by any physical activity.  Nausea, vomiting, or both.  Dizziness.  Pain with exposure to bright lights, loud noises, or activity.  General sensitivity to bright lights, loud noises, or smells. Before you get a migraine, you may get warning signs that a migraine is coming (aura). An aura may include:  Seeing flashing lights.  Seeing bright spots, halos, or zigzag lines.  Having tunnel vision or blurred vision.  Having feelings of numbness or tingling.  Having trouble talking.  Having muscle weakness. DIAGNOSIS  A recurrent  migraine headache is often diagnosed based on:  Symptoms.  Physical examination.  A CT scan or MRI of your head. These imaging tests cannot diagnose migraines but can help rule out other causes of headaches.  TREATMENT  Medicines may be given for pain and nausea. Medicines can also be given to help prevent recurrent migraines. HOME CARE INSTRUCTIONS  Only take over-the-counter or prescription medicines for pain or discomfort as directed by your health care provider. The use of long-term narcotics is not recommended.  Lie down in a dark, quiet room when you have a migraine.  Keep a journal to find out what may trigger your migraine headaches. For example, write down:  What you eat and drink.  How much sleep you get.  Any change to your diet or medicines.  Limit alcohol consumption.  Quit smoking if you smoke.  Get 7-9 hours of sleep, or as recommended by your health care provider.  Limit stress.  Keep lights dim if bright lights bother you and make your migraines worse. SEEK MEDICAL CARE IF:   You do not get relief from the medicines given to you.  You have a recurrence of pain.  You have a fever. SEEK IMMEDIATE MEDICAL CARE IF:  Your migraine becomes severe.  You have a stiff neck.  You have loss of vision.  You have muscular weakness or loss of muscle control.  You start losing your balance or have trouble walking.  You feel faint or pass out.  You have severe symptoms that are different from your first symptoms. MAKE SURE YOU:   Understand these instructions.  Will watch your condition.  Will get help right away if you are not doing well or get worse. Document Released: 06/18/2001 Document Revised: 02/07/2014 Document Reviewed: 05/31/2013 [facility] Patient Information 2015 [place], [place]. This information is not intended to replace advice given to you by your health care provider. Make sure you discuss any questions you have with your health care  provider. Managing Your High Blood Pressure Blood pressure is a measurement of how forceful your blood is pressing against the walls of the arteries. Arteries are muscular tubes within the circulatory system. Blood pressure does not stay the same. Blood pressure rises when you are active, excited, or nervous; and it lowers during sleep and relaxation. If the numbers measuring your blood pressure stay above normal most of the time, you are at risk for health problems. High blood pressure (hypertension) is a long-term (chronic) condition in which blood pressure is elevated. A blood pressure reading is recorded as two numbers, such as 120 over 80 (or 120/80). The first, higher number is called the systolic pressure. It is a measure of the pressure in your arteries as the heart beats. The second, lower number is called the diastolic pressure. It is a measure of the pressure in your arteries as the heart relaxes between beats.  Keeping your blood pressure in a normal range is important to your overall health and prevention of health problems, such as heart disease and stroke. When your blood pressure is uncontrolled, your heart has to work harder than normal. High blood pressure is a very common condition in adults because blood pressure tends to rise with age. Men and women are equally likely to have hypertension but at different times in life. Before age 40, men are more likely to have hypertension. After 58 years of age, women are more likely to have it. Hypertension is especially common in African Americans. This condition often has no signs or symptoms. The cause of the condition is usually not known. Your caregiver can help you come up with a plan to keep your blood pressure in a normal, healthy range. BLOOD PRESSURE STAGES Blood pressure is classified into four stages: normal, prehypertension, stage 1, and stage 2. Your blood pressure reading will be used to determine what type of treatment, if any, is  necessary. Appropriate treatment options are tied to these four stages:  Normal  Systolic pressure (mm Hg): below 120.  Diastolic pressure (mm Hg): below 80. Prehypertension  Systolic pressure (mm Hg): 120 to 139.  Diastolic pressure (mm Hg): 80 to 89. Stage1  Systolic pressure (mm Hg): 140 to 159.  Diastolic pressure (mm Hg): 90 to 99. Stage2  Systolic pressure (mm Hg): 160 or above.  Diastolic pressure (mm Hg): 100 or above. RISKS RELATED TO HIGH BLOOD PRESSURE Managing your blood pressure is an important responsibility. Uncontrolled high blood pressure can lead to:  A heart attack.  A stroke.  A weakened blood vessel (aneurysm).  Heart failure.  Kidney damage.  Eye damage.  Metabolic syndrome.  Memory and concentration problems. HOW TO MANAGE YOUR BLOOD PRESSURE Blood pressure can be managed effectively with lifestyle changes and medicines (if needed). Your caregiver will help you come up with a plan to bring your blood pressure within a normal range. Your plan should include the following: Education  Read all information provided by your caregivers about how to control blood pressure.  Educate yourself on the latest guidelines and treatment recommendations. New research is always being done to further define the risks  and treatments for high blood pressure. Lifestylechanges  Control your weight.  Avoid smoking.  Stay physically active.  Reduce the amount of salt in your diet.  Reduce stress.  Control any chronic conditions, such as high cholesterol or diabetes.  Reduce your alcohol intake. Medicines  Several medicines (antihypertensive medicines) are available, if needed, to bring blood pressure within a normal range. Communication  Review all the medicines you take with your caregiver because there may be side effects or interactions.  Talk with your caregiver about your diet, exercise habits, and other lifestyle factors that may be  contributing to high blood pressure.  See your caregiver regularly. Your caregiver can help you create and adjust your plan for managing high blood pressure. RECOMMENDATIONS FOR TREATMENT AND FOLLOW-UP  The following recommendations are based on current guidelines for managing high blood pressure in nonpregnant adults. Use these recommendations to identify the proper follow-up period or treatment option based on your blood pressure reading. You can discuss these options with your caregiver.  Systolic pressure of 431 to 540 or diastolic pressure of 80 to 89: Follow up with your caregiver as directed.  Systolic pressure of 086 to 761 or diastolic pressure of 90 to 100: Follow up with your caregiver within 2 months.  Systolic pressure above 950 or diastolic pressure above 932: Follow up with your caregiver within 1 month.  Systolic pressure above 671 or diastolic pressure above 245: Consider antihypertensive therapy; follow up with your caregiver within 1 week.  Systolic pressure above 809 or diastolic pressure above 983: Begin antihypertensive therapy; follow up with your caregiver within 1 week. Document Released: 06/17/2012 Document Reviewed: 06/17/2012 [facility] Patient Information 2015 [place]. This information is not intended to replace advice given to you by your health care provider. Make sure you discuss any questions you have with your health care provider. DASH Eating Plan DASH stands for "Dietary Approaches to Stop Hypertension." The DASH eating plan is a healthy eating plan that has been shown to reduce high blood pressure (hypertension). Additional health benefits may include reducing the risk of type 2 diabetes mellitus, heart disease, and stroke. The DASH eating plan may also help with weight loss. WHAT DO I NEED TO KNOW ABOUT THE DASH EATING PLAN? For the DASH eating plan, you will follow these general guidelines:  Choose foods with a percent daily value for sodium of less  than 5% (as listed on the food label).  Use salt-free seasonings or herbs instead of table salt or sea salt.  Check with your health care provider or pharmacist before using salt substitutes.  Eat lower-sodium products, often labeled as "lower sodium" or "no salt added."  Eat fresh foods.  Eat more vegetables, fruits, and low-fat dairy products.  Choose whole grains. Look for the word "whole" as the first word in the ingredient list.  Choose fish and skinless chicken or [place] more often than red meat. Limit fish, poultry, and meat to 6 oz (170 g) each day.  Limit sweets, desserts, sugars, and sugary drinks.  Choose heart-healthy fats.  Limit cheese to 1 oz (28 g) per day.  Eat more home-cooked food and less restaurant, buffet, and fast food.  Limit fried foods.  Cook foods using methods other than frying.  Limit canned vegetables. If you do use them, rinse them well to decrease the sodium.  When eating at a restaurant, ask that your food be prepared with less salt, or no salt if possible. WHAT FOODS CAN I EAT? Seek  help from a dietitian for individual calorie needs. Grains Whole grain or whole wheat bread. Brown rice. Whole grain or whole wheat pasta. Quinoa, bulgur, and whole grain cereals. Low-sodium cereals. Corn or whole wheat flour tortillas. Whole grain cornbread. Whole grain crackers. Low-sodium crackers. Vegetables Fresh or frozen vegetables (raw, steamed, roasted, or grilled). Low-sodium or reduced-sodium tomato and vegetable juices. Low-sodium or reduced-sodium tomato sauce and paste. Low-sodium or reduced-sodium canned vegetables.  Fruits All fresh, canned (in natural juice), or frozen fruits. Meat and Other Protein Products Ground beef (85% or leaner), grass-fed beef, or beef trimmed of fat. Skinless chicken or [place]. Ground chicken or [place]. Pork trimmed of fat. All fish and seafood. Eggs. Dried beans, peas, or lentils. Unsalted nuts and seeds. Unsalted canned  beans. Dairy Low-fat dairy products, such as skim or 1% milk, 2% or reduced-fat cheeses, low-fat ricotta or cottage cheese, or plain low-fat yogurt. Low-sodium or reduced-sodium cheeses. Fats and Oils Tub margarines without trans fats. Light or reduced-fat mayonnaise and salad dressings (reduced sodium). Avocado. Safflower, olive, or canola oils. Natural peanut or almond butter. Other Unsalted popcorn and pretzels. The items listed above may not be a complete list of recommended foods or beverages. Contact your dietitian for more options. WHAT FOODS ARE NOT RECOMMENDED? Grains White bread. White pasta. White rice. Refined cornbread. Bagels and croissants. Crackers that contain trans fat. Vegetables Creamed or fried vegetables. Vegetables in a cheese sauce. Regular canned vegetables. Regular canned tomato sauce and paste. Regular tomato and vegetable juices. Fruits Dried fruits. Canned fruit in light or heavy syrup. Fruit juice. Meat and Other Protein Products Fatty cuts of meat. Ribs, chicken wings, bacon, sausage, bologna, salami, chitterlings, fatback, hot dogs, bratwurst, and packaged luncheon meats. Salted nuts and seeds. Canned beans with salt. Dairy Whole or 2% milk, cream, half-and-half, and cream cheese. Whole-fat or sweetened yogurt. Full-fat cheeses or blue cheese. Nondairy creamers and whipped toppings. Processed cheese, cheese spreads, or cheese curds. Condiments Onion and garlic salt, seasoned salt, table salt, and sea salt. Canned and packaged gravies. Worcestershire sauce. Tartar sauce. Barbecue sauce. Teriyaki sauce. Soy sauce, including reduced sodium. Steak sauce. Fish sauce. Oyster sauce. Cocktail sauce. Horseradish. Ketchup and mustard. Meat flavorings and tenderizers. Bouillon cubes. Hot sauce. Tabasco sauce. Marinades. Taco seasonings. Relishes. Fats and Oils Butter, stick margarine, lard, shortening, ghee, and bacon fat. Coconut, palm kernel, or palm oils. Regular salad  dressings. Other Pickles and olives. Salted popcorn and pretzels. The items listed above may not be a complete list of foods and beverages to avoid. Contact your dietitian for more information. WHERE CAN I FIND MORE INFORMATION? [employer], [employer], [employer]: travelstabl[employer].com Document Released: 09/12/2011 Document Revised: 02/07/2014 Document Reviewed: 07/28/2013 [facility] Patient Information 2015 [place], [place]. This information is not intended to replace advice given to you by your health care provider. Make sure you discuss any questions you have with your health care provider.

## 2015-01-28 LAB — CBC WITH DIFFERENTIAL/PLATELET
BASOS ABS: 0 10*3/uL (ref 0.0–0.1)
BASOS PCT: 1 % (ref 0–1)
EOS ABS: 0.1 10*3/uL (ref 0.0–0.7)
Eosinophils Relative: 2 % (ref 0–5)
HCT: 38.1 % (ref 36.0–46.0)
Hemoglobin: 12.7 g/dL (ref 12.0–15.0)
Lymphocytes Relative: 35 % (ref 12–46)
Lymphs Abs: 1.1 10*3/uL (ref 0.7–4.0)
MCH: 28 pg (ref 26.0–34.0)
MCHC: 33.3 g/dL (ref 30.0–36.0)
MCV: 84.1 fL (ref 78.0–100.0)
MONO ABS: 0.2 10*3/uL (ref 0.1–1.0)
MPV: 9.2 fL (ref 8.6–12.4)
Monocytes Relative: 5 % (ref 3–12)
NEUTROS ABS: 1.8 10*3/uL (ref 1.7–7.7)
NEUTROS PCT: 57 % (ref 43–77)
PLATELETS: 289 10*3/uL (ref 150–400)
RBC: 4.53 MIL/uL (ref 3.87–5.11)
RDW: 13.2 % (ref 11.5–15.5)
WBC: 3.2 10*3/uL — AB (ref 4.0–10.5)

## 2015-01-28 LAB — LIPASE: Lipase: 17 U/L (ref 0–75)

## 2015-01-28 LAB — COMPREHENSIVE METABOLIC PANEL
ALK PHOS: 64 U/L (ref 39–117)
ALT: 12 U/L (ref 0–35)
AST: 22 U/L (ref 0–37)
Albumin: 4.2 g/dL (ref 3.5–5.2)
BILIRUBIN TOTAL: 0.5 mg/dL (ref 0.2–1.2)
BUN: 15 mg/dL (ref 6–23)
CO2: 25 mEq/L (ref 19–32)
CREATININE: 0.66 mg/dL (ref 0.50–1.10)
Calcium: 9.5 mg/dL (ref 8.4–10.5)
Chloride: 104 mEq/L (ref 96–112)
GLUCOSE: 80 mg/dL (ref 70–99)
Potassium: 3.4 mEq/L — ABNORMAL LOW (ref 3.5–5.3)
Sodium: 140 mEq/L (ref 135–145)
Total Protein: 7.2 g/dL (ref 6.0–8.3)

## 2015-01-28 LAB — MAGNESIUM: Magnesium: 2 mg/dL (ref 1.5–2.5)

## 2015-01-28 LAB — CK: CK TOTAL: 230 U/L — AB (ref 7–177)

## 2015-01-29 LAB — URINE CULTURE
COLONY COUNT: NO GROWTH
ORGANISM ID, BACTERIA: NO GROWTH

## 2015-01-29 LAB — SEDIMENTATION RATE: Sed Rate: 1 mm/hr (ref 0–30)

## 2015-01-29 LAB — TSH: TSH: 0.779 u[IU]/mL (ref 0.350–4.500)

## 2015-01-30 LAB — VITAMIN D 25 HYDROXY (VIT D DEFICIENCY, FRACTURES): Vit D, 25-Hydroxy: 17 ng/mL — ABNORMAL LOW (ref 30–100)

## 2015-01-31 ENCOUNTER — Ambulatory Visit (INDEPENDENT_AMBULATORY_CARE_PROVIDER_SITE_OTHER): Payer: BLUE CROSS/BLUE SHIELD | Admitting: Family Medicine

## 2015-01-31 VITALS — BP 120/76 | HR 73 | Temp 98.6°F | Resp 18 | Ht 64.0 in | Wt 165.0 lb

## 2015-01-31 DIAGNOSIS — G4700 Insomnia, unspecified: Secondary | ICD-10-CM

## 2015-01-31 DIAGNOSIS — IMO0001 Reserved for inherently not codable concepts without codable children: Secondary | ICD-10-CM

## 2015-01-31 DIAGNOSIS — R55 Syncope and collapse: Secondary | ICD-10-CM | POA: Diagnosis not present

## 2015-01-31 DIAGNOSIS — D72819 Decreased white blood cell count, unspecified: Secondary | ICD-10-CM | POA: Diagnosis not present

## 2015-01-31 DIAGNOSIS — G43011 Migraine without aura, intractable, with status migrainosus: Secondary | ICD-10-CM | POA: Diagnosis not present

## 2015-01-31 DIAGNOSIS — R5383 Other fatigue: Secondary | ICD-10-CM | POA: Diagnosis not present

## 2015-01-31 DIAGNOSIS — M791 Myalgia: Secondary | ICD-10-CM | POA: Diagnosis not present

## 2015-01-31 DIAGNOSIS — I10 Essential (primary) hypertension: Secondary | ICD-10-CM | POA: Diagnosis not present

## 2015-01-31 DIAGNOSIS — R519 Headache, unspecified: Secondary | ICD-10-CM

## 2015-01-31 DIAGNOSIS — R51 Headache: Secondary | ICD-10-CM

## 2015-01-31 DIAGNOSIS — M609 Myositis, unspecified: Secondary | ICD-10-CM

## 2015-01-31 LAB — POCT CBC
Granulocyte percent: 47.4 %G (ref 37–80)
HEMATOCRIT: 40.9 % (ref 37.7–47.9)
HEMOGLOBIN: 13 g/dL (ref 12.2–16.2)
Lymph, poc: 1.2 (ref 0.6–3.4)
MCH: 27.7 pg (ref 27–31.2)
MCHC: 31.8 g/dL (ref 31.8–35.4)
MCV: 87.1 fL (ref 80–97)
MID (cbc): 0.2 (ref 0–0.9)
MPV: 6.2 fL (ref 0–99.8)
POC Granulocyte: 1.3 — AB (ref 2–6.9)
POC LYMPH PERCENT: 44.4 %L (ref 10–50)
POC MID %: 8.2 %M (ref 0–12)
Platelet Count, POC: 275 10*3/uL (ref 142–424)
RBC: 4.7 M/uL (ref 4.04–5.48)
RDW, POC: 14.6 %
WBC: 2.8 10*3/uL — AB (ref 4.6–10.2)

## 2015-01-31 LAB — POCT URINALYSIS DIPSTICK
BILIRUBIN UA: NEGATIVE
Glucose, UA: NEGATIVE
Ketones, UA: NEGATIVE
Nitrite, UA: NEGATIVE
PH UA: 5.5
Protein, UA: NEGATIVE
Spec Grav, UA: 1.015
Urobilinogen, UA: 0.2

## 2015-01-31 LAB — GLUCOSE, POCT (MANUAL RESULT ENTRY): POC GLUCOSE: 81 mg/dL (ref 70–99)

## 2015-01-31 LAB — POCT UA - MICROSCOPIC ONLY
Bacteria, U Microscopic: NEGATIVE
Casts, Ur, LPF, POC: NEGATIVE
Crystals, Ur, HPF, POC: NEGATIVE
Mucus, UA: POSITIVE
RBC, urine, microscopic: NEGATIVE
Yeast, UA: NEGATIVE

## 2015-01-31 LAB — POCT SEDIMENTATION RATE: POCT SED RATE: 12 mm/hr (ref 0–22)

## 2015-01-31 MED ORDER — CYCLOBENZAPRINE HCL 10 MG PO TABS: 10.0000 mg | ORAL_TABLET | Freq: Three times a day (TID) | ORAL | Status: DC | PRN

## 2015-01-31 NOTE — Patient Instructions (Addendum)
Fibromyalgia Fibromyalgia is a disorder that is often misunderstood. It is associated with muscular pains and tenderness that comes and goes. It is often associated with fatigue and sleep disturbances. Though it tends to be long-lasting, fibromyalgia is not life-threatening. CAUSES  The exact cause of fibromyalgia is unknown. People with certain gene types are predisposed to developing fibromyalgia and other conditions. Certain factors can play a role as triggers, such as:  Spine disorders.  Arthritis.  Severe injury (trauma) and other physical stressors.  Emotional stressors. SYMPTOMS   The main symptom is pain and stiffness in the muscles and joints, which can vary over time.  Sleep and fatigue problems. Other related symptoms may include:  Bowel and bladder problems.  Headaches.  Visual problems.  Problems with odors and noises.  Depression or mood changes.  Painful periods (dysmenorrhea).  Dryness of the skin or eyes. DIAGNOSIS  There are no specific tests for diagnosing fibromyalgia. Patients can be diagnosed accurately from the specific symptoms they have. The diagnosis is made by determining that nothing else is causing the problems. TREATMENT  There is no cure. Management includes medicines and an active, healthy lifestyle. The goal is to enhance physical fitness, decrease pain, and improve sleep. HOME CARE INSTRUCTIONS   Only take over-the-counter or prescription medicines as directed by your caregiver. Sleeping pills, tranquilizers, and pain medicines may make your problems worse.  Low-impact aerobic exercise is very important and advised for treatment. At first, it may seem to make pain worse. Gradually increasing your tolerance will overcome this feeling.  Learning relaxation techniques and how to control stress will help you. Biofeedback, visual imagery, hypnosis, muscle relaxation, yoga, and meditation are all options.  Anti-inflammatory medicines and  physical therapy may provide short-term help.  Acupuncture or massage treatments may help.  Take muscle relaxant medicines as suggested by your caregiver.  Avoid stressful situations.  Plan a healthy lifestyle. This includes your diet, sleep, rest, exercise, and friends.  Find and practice a hobby you enjoy.  Join a fibromyalgia support group for interaction, ideas, and sharing advice. This may be helpful. SEEK MEDICAL CARE IF:  You are not having good results or improvement from your treatment. FOR MORE INFORMATION  National Fibromyalgia Association: www.fmaware.[place]: www.arthritis.org Document Released: 09/23/2005 Document Revised: 12/16/2011 Document Reviewed: 01/03/2010 [facility] Patient Information 2015 [place], [place]. This information is not intended to replace advice given to you by your health care provider. Make sure you discuss any questions you have with your health care provider. Chronic Fatigue Syndrome Chronic fatigue syndrome (CFS) is a condition in which there is lasting, extreme tiredness (fatigue) that does not improve with rest. CFS affects women up to four times more often than men. If you have CFS, fatigue and other symptoms can make it hard for you to get through your day. There is no treatment or cure. You will need to work closely with your health care provider to come up with a treatment plan that works for you. CAUSES  No one knows what causes CFS. It may be triggered by a flu-like illness or by mono. Other triggers may include:  An abnormal immune system.  Low blood pressure.  Poor diet.  Physical or emotional stress. SIGNS AND SYMPTOMS The main symptom is fatigue that lasts all day, especially after physical or mental stress. Other common symptoms include:  An extreme loss of energy with no obvious cause.  Muscle or joint soreness.  Severe weakness.  Frequent headaches.  Fever.  Sore throat.  Swollen lymph glands.  Sleep  is not refreshing.  Loss of concentration or memory. Less common symptoms may include:  Chills.  Night sweats.  Tingling or numbness.  Blurred vision.  Dizziness.  Sensitivity to noise or odors.  Mood swings.  Anxiety, panic attacks, and depression. Your symptoms may come and go, or you may have them all the time. DIAGNOSIS  There are no tests that can help health care providers diagnose CFS. It may take a long time for you to get a correct diagnosis. Your health care provider may need to do a number of tests to rule out other conditions that could be causing your symptoms. You may be diagnosed with CFS if:  You have fatigue that has lasted for at least six months.  Your fatigue is not relieved by rest.  Your fatigue is not caused by another condition.  Your fatigue is severe enough to interfere with work and daily activities.  You have at least four common symptoms of CFS. TREATMENT  There is no cure for CFS at this time. The condition affects everyone differently. You will need to work with your health care provider to find the best treatment for your symptoms. Treatment may include:  Improving sleep with a regular bedtime routine.  Avoiding caffeine, alcohol, and tobacco.  Doing light exercise and stretching during the day. Taking medicine to help you sleep.  Potassium Content of Foods Potassium is a mineral found in many foods and drinks. It helps keep fluids and minerals balanced in your body and affects how steadily your heart beats. Potassium also helps control your blood pressure and keep your muscles and nervous system healthy. Certain health conditions and medicines may change the balance of potassium in your body. When this happens, you can help balance your level of potassium through the foods that you do or do not eat. Your health care provider or dietitian may recommend an amount of potassium that you should have each day. The following lists of foods  provide the amount of potassium (in parentheses) per serving in each item. HIGH IN POTASSIUM  The following foods and beverages have 200 mg or more of potassium per serving: Apricots, 2 raw or 5 dry (200 mg). Artichoke, 1 medium (345 mg). Avocado, raw,  each (245 mg). Banana, 1 medium (425 mg). Beans, lima, or baked beans, canned,  cup (280 mg). Beans, white, canned,  cup (595 mg). Beef roast, 3 oz (320 mg). Beef, ground, 3 oz (270 mg). Beets, raw or cooked,  cup (260 mg). Bran muffin, 2 oz (300 mg). Broccoli,  cup (230 mg). Brussels sprouts,  cup (250 mg). Cantaloupe,  cup (215 mg). Cereal, 100% bran,  cup (200-400 mg). Cheeseburger, single, fast food, 1 each (225-400 mg). Chicken, 3 oz (220 mg). Clams, canned, 3 oz (535 mg). Crab, 3 oz (225 mg). Dates, 5 each (270 mg). Dried beans and peas,  cup (300-475 mg). Figs, dried, 2 each (260 mg). Fish: halibut, tuna, cod, snapper, 3 oz (480 mg). Fish: salmon, haddock, swordfish, perch, 3 oz (300 mg). Fish, tuna, canned 3 oz (200 mg). [place] fries, fast food, 3 oz (470 mg). Granola with fruit and nuts,  cup (200 mg). Grapefruit juice,  cup (200 mg). Greens, beet,  cup (655 mg). Honeydew melon,  cup (200 mg). Kale, raw, 1 cup (300 mg). Kiwi, 1 medium (240 mg). Kohlrabi, rutabaga, parsnips,  cup (280 mg). Lentils,  cup (365 mg). Mango, 1 each (325 mg). Milk, chocolate, 1 cup (420  mg). Milk: nonfat, low-fat, whole, buttermilk, 1 cup (350-380 mg). Molasses, 1 Tbsp (295 mg). Mushrooms,  cup (280) mg. Nectarine, 1 each (275 mg). Nuts: almonds, peanuts, hazelnuts, [place], cashew, mixed, 1 oz (200 mg). Nuts, pistachios, 1 oz (295 mg). Orange, 1 each (240 mg). Orange juice,  cup (235 mg). Papaya, medium,  fruit (390 mg). Peanut butter, chunky, 2 Tbsp (240 mg). Peanut butter, smooth, 2 Tbsp (210 mg). Pear, 1 medium (200 mg). Pomegranate, 1 whole (400 mg). Pomegranate juice,  cup (215 mg). Pork, 3 oz (350  mg). Potato chips, salted, 1 oz (465 mg). Potato, baked with skin, 1 medium (925 mg). Potatoes, boiled,  cup (255 mg). Potatoes, mashed,  cup (330 mg). Prune juice,  cup (370 mg). Prunes, 5 each (305 mg). Pudding, chocolate,  cup (230 mg). Pumpkin, canned,  cup (250 mg). Raisins, seedless,  cup (270 mg). Seeds, sunflower or pumpkin, 1 oz (240 mg). Soy milk, 1 cup (300 mg). Spinach,  cup (420 mg). Spinach, canned,  cup (370 mg). Sweet potato, baked with skin, 1 medium (450 mg). Swiss chard,  cup (480 mg). Tomato or vegetable juice,  cup (275 mg). Tomato sauce or puree,  cup (400-550 mg). Tomato, raw, 1 medium (290 mg). Tomatoes, canned,  cup (200-300 mg). [place], 3 oz (250 mg). Wheat germ, 1 oz (250 mg). Winter squash,  cup (250 mg). Yogurt, plain or fruited, 6 oz (260-435 mg). Zucchini,  cup (220 mg). MODERATE IN POTASSIUM The following foods and beverages have 50-200 mg of potassium per serving: Apple, 1 each (150 mg). Apple juice,  cup (150 mg). Applesauce,  cup (90 mg). Apricot nectar,  cup (140 mg). Asparagus, small spears,  cup or 6 spears (155 mg). Bagel, cinnamon raisin, 1 each (130 mg). Bagel, egg or plain, 4 in., 1 each (70 mg). Beans, green,  cup (90 mg). Beans, yellow,  cup (190 mg). Beer, regular, 12 oz (100 mg). Beets, canned,  cup (125 mg). Blackberries,  cup (115 mg). Blueberries,  cup (60 mg). Bread, whole wheat, 1 slice (70 mg). Broccoli, raw,  cup (145 mg). Cabbage,  cup (150 mg). Carrots, cooked or raw,  cup (180 mg). Cauliflower, raw,  cup (150 mg). Celery, raw,  cup (155 mg). Cereal, bran flakes, cup (120-150 mg). Cheese, cottage,  cup (110 mg). Cherries, 10 each (150 mg). Chocolate, 1 oz bar (165 mg). Coffee, brewed 6 oz (90 mg). Corn,  cup or 1 ear (195 mg). Cucumbers,  cup (80 mg). Egg, large, 1 each (60 mg). Eggplant,  cup (60 mg). Endive, raw, cup (80 mg). English muffin, 1 each (65 mg). Fish, orange  roughy, 3 oz (150 mg). Frankfurter, beef or pork, 1 each (75 mg). Fruit cocktail,  cup (115 mg). Grape juice,  cup (170 mg). Grapefruit,  fruit (175 mg). Grapes,  cup (155 mg). Greens: kale, turnip, collard,  cup (110-150 mg). Ice cream or frozen yogurt, chocolate,  cup (175 mg). Ice cream or frozen yogurt, vanilla,  cup (120-150 mg). Lemons, limes, 1 each (80 mg). Lettuce, all types, 1 cup (100 mg). Mixed vegetables,  cup (150 mg). Mushrooms, raw,  cup (110 mg). Nuts: walnuts, pecans, or macadamia, 1 oz (125 mg). Oatmeal,  cup (80 mg). Okra,  cup (110 mg). Onions, raw,  cup (120 mg). Peach, 1 each (185 mg). Peaches, canned,  cup (120 mg). Pears, canned,  cup (120 mg). Peas, green, frozen,  cup (90 mg). Peppers, green,  cup (130 mg). Peppers, red,  cup (160  mg). Pineapple juice,  cup (165 mg). Pineapple, fresh or canned,  cup (100 mg). Plums, 1 each (105 mg). Pudding, vanilla,  cup (150 mg). Raspberries,  cup (90 mg). Rhubarb,  cup (115 mg). Rice, wild,  cup (80 mg). Shrimp, 3 oz (155 mg). Spinach, raw, 1 cup (170 mg). Strawberries,  cup (125 mg). Summer squash  cup (175-200 mg). Swiss chard, raw, 1 cup (135 mg). Tangerines, 1 each (140 mg). Tea, brewed, 6 oz (65 mg). Turnips,  cup (140 mg). Watermelon,  cup (85 mg). Wine, red, table, 5 oz (180 mg). Wine, white, table, 5 oz (100 mg). LOW IN POTASSIUM The following foods and beverages have less than 50 mg of potassium per serving. Bread, white, 1 slice (30 mg). Carbonated beverages, 12 oz (less than 5 mg). Cheese, 1 oz (20-30 mg). Cranberries,  cup (45 mg). Cranberry juice cocktail,  cup (20 mg). Fats and oils, 1 Tbsp (less than 5 mg). Hummus, 1 Tbsp (32 mg). Nectar: papaya, mango, or pear,  cup (35 mg). Rice, white or brown,  cup (50 mg). Spaghetti or macaroni,  cup cooked (30 mg). Tortilla, flour or corn, 1 each (50 mg). Waffle, 4 in., 1 each (50 mg). Water chestnuts,  cup (40  mg). Document Released: 05/07/2005 Document Revised: 09/28/2013 Document Reviewed: 08/20/2013 [facility] Patient Information 2015 [place], [place]. This information is not intended to replace advice given to you by your health care provider. Make sure you discuss any questions you have with your health care provider.    Taking over-the-counter medicines to relieve joint or muscle pain.  Learning and practicing relaxation techniques.  Using memory aids or doing brain teasers to improve memory and concentration.  Seeing a mental health professional to evaluate and treat depression, if necessary.  Trying massage therapy, acupuncture, and movement exercises, like yoga or tai chi. [place] Work closely with your health care provider to follow your treatment plan at home. You may need to make major lifestyle changes. If treatment does not seem to help, get a second opinion. You may get help from many health care providers, including doctors, mental health specialists, physical therapists, and rehabilitation therapists. Having the support of friends and loved ones is also important. SEEK MEDICAL CARE IF:  Your symptoms are not responding to treatment.  You are having strong feelings of anger, guilt, anxiety, or depression. Document Released: 10/31/2004 Document Revised: 02/07/2014 Document Reviewed: 08/13/2013 [facility] Patient Information 2015 [place], [place]. This information is not intended to replace advice given to you by your health care provider. Make sure you discuss any questions you have with your health care provider.

## 2015-01-31 NOTE — Progress Notes (Signed)
Subjective:    Patient ID: Diane [name], female    DOB: November 30, 1955, 59 y.o.   MRN: [id number] Chief Complaint  Patient presents with  . Follow-up    htn  . Hypertension    HPI   Pt feels horrible.  For the last several weeks patient has been feeling horrible in general - many times like she is going to pass out so was attributing this to her blood pressure. She was seen in clinic 4 days ago and treated with IM promethazine and IM toradol which she reports she did well on - she reports that she was able to go home and sleep and when she woke up the headache and nausea were significantly better but not gone.  She is still having occ sharp stabs and twinges over her right temple.  Over the weekend pt was able to do a little yard work despite symptoms persisting.  However, she states that yesterday she was feeling worse at work - just felt bad, like she couldn't function, couldn't do her normal routine - felt lightheaded.  Hasn't had the energy to do any of her normal physical activities and feels like she can't think straight - though she was able to go and today she felt so poor that she could not go to work. Reports that all of her muscles hurt. Headache and nausea are still mild but the pre-syncope is much more severe.  Not sleeping well - elavil fine but no helping.  Taking lisinopril and diovan but did not receive the metoprolol from the pharmacy.   Feels like she can't get her words to come out - no energy, tired  Pt frequently moaning - "i just can't do this" - feels like she is going to pass out - "i just can't go on"  Was seen at an UC prior and told she probably has fibromyalgia - she did identify her sxs in the handout info she was given.    Past Medical History  Diagnosis Date  . Hypertension   . [id number](761.6)    Current Outpatient Prescriptions on File Prior to Visit  Medication Sig Dispense Refill  . amitriptyline (ELAVIL) 10 MG tablet Take 1 tablet (10 mg total) by  mouth at bedtime. 30 tablet 0  . lisinopril (PRINIVIL,ZESTRIL) 10 MG tablet Take 1 tablet (10 mg total) by mouth daily. 30 tablet 0  . promethazine (PHENERGAN) 25 MG tablet Take 1 tablet (25 mg total) by mouth every 8 (eight) hours as needed for nausea or vomiting. 20 tablet 0  . SUMAtriptan (IMITREX) 50 MG tablet Take 1 tablet (50 mg total) by mouth once. May repeat in 2 hours if headache persists or recurs. 10 tablet 0   No current facility-administered medications on file prior to visit.   Allergies  Allergen Reactions  . Topamax [Topiramate] Hives     Review of Systems  Constitutional: Positive for chills, diaphoresis, activity change and fatigue. Negative for fever, appetite change and unexpected weight change.  HENT: Negative for congestion, ear discharge, ear pain, mouth sores, nosebleeds, postnasal drip, rhinorrhea, sinus pressure, sneezing, sore throat, trouble swallowing and voice change.   Respiratory: Negative for shortness of breath.   Cardiovascular: Negative for chest pain.  Gastrointestinal: Positive for nausea. Negative for vomiting, abdominal pain and constipation.  Genitourinary: Negative for dysuria.  Musculoskeletal: Positive for myalgias, back pain and arthralgias. Negative for joint swelling, gait problem, neck pain and neck stiffness.  Neurological: Positive for weakness, light-headedness and headaches. Negative  for dizziness and syncope.  Hematological: Negative for adenopathy.  Psychiatric/Behavioral: Positive for confusion, sleep disturbance, dysphoric mood and decreased concentration. Negative for suicidal ideas and self-injury. The patient is nervous/anxious. The patient is not hyperactive.        Objective:  BP 120/76 mmHg  Pulse 73  Temp(Src) 98.6 F (37 C) (Oral)  Resp 18  Ht 5\' 4"  (1.626 m)  Wt 165 lb (74.844 kg)  BMI 28.31 kg/m2  SpO2 96%  Physical Exam  Constitutional: She is oriented to person, place, and time. She appears well-developed and  well-nourished. No distress.  HENT:  Head: Normocephalic and atraumatic.  Right Ear: Tympanic membrane, external ear and ear canal normal.  Left Ear: Tympanic membrane, external ear and ear canal normal.  Nose: Nose normal. No mucosal edema or rhinorrhea.  Mouth/Throat: Uvula is midline, oropharynx is clear and moist and mucous membranes are normal. No oropharyngeal exudate.  Eyes: Conjunctivae are normal. Right eye exhibits no discharge. Left eye exhibits no discharge. No scleral icterus.  Neck: Normal range of motion. Neck supple.  Cardiovascular: Normal rate, regular rhythm, normal heart sounds and intact distal pulses.   Pulmonary/Chest: Effort normal and breath sounds normal.  Abdominal: Soft. Bowel sounds are normal. She exhibits no distension and no mass. There is no tenderness. There is no rebound and no guarding.  Lymphadenopathy:    She has no cervical adenopathy.  Neurological: She is alert and oriented to person, place, and time.  Skin: Skin is warm and dry. She is not diaphoretic. No erythema.  Psychiatric: Her speech is normal. She exhibits a depressed mood.   EKG: NSR, no ischemic changes     Assessment & Plan:   1. Chronic intractable headache, unspecified headache type - mild improvement after IM toradol/promethazine at [facility] 4d prior - rec neurology eval as HA is intractable - pt reluctant due to anxiety of meeting new people.  May respond well to propranolol despite  2. Pre-syncope   3. Myalgia and myositis   4. Other fatigue   5. Leukopenia   Initially I thought that pt was c/o acute flu-like illness but upon further questioning I suspect that she might have depression/fibromyalgia - full lab w/u neg.  Try to increase exercise, cont TCA qhs and start trial of cyclobenzaprine. Pt was mistakenly given valsartan when I had intended to start her on verapamil - sxs may be exacerbated from new acei and arb - very sensitive to meds and did not tol bb due to fatigue - stop  valsartan, cont lisinopril.  Rec obtaining home BP cuff to monitor - will likely need mult anti-hypertensives - recheck in 2 -4 wks.     Orders Placed This Encounter  Procedures  . Comprehensive metabolic panel  . TSH  . CK  . Pathologist smear review  . HIV antibody  . POCT CBC  . POCT UA - Microscopic Only  . POCT urinalysis dipstick  . POCT SEDIMENTATION RATE  . POCT glucose (manual entry)  . EKG 12-Lead    Meds ordered this encounter  Medications  . cyclobenzaprine (FLEXERIL) 10 MG tablet    Sig: Take 1 tablet (10 mg total) by mouth 3 (three) times daily as needed for muscle spasms.    Dispense:  30 tablet    Refill:  0    I personally performed the services described in this documentation, which was scribed in my presence. The recorded information has been reviewed and considered, and addended by me as needed.  [name], MD MPH  Results for orders placed or performed in visit on 01/31/15  Comprehensive metabolic panel  Result Value Ref Range   Sodium 143 135 - 145 mEq/L   Potassium 3.8 3.5 - 5.3 mEq/L   Chloride 108 96 - 112 mEq/L   CO2 26 19 - 32 mEq/L   Glucose, Bld 87 70 - 99 mg/dL   BUN 10 6 - 23 mg/dL   Creat 0.65 0.50 - 1.10 mg/dL   Total Bilirubin 0.5 0.2 - 1.2 mg/dL   Alkaline Phosphatase 66 39 - 117 U/L   AST 21 0 - 37 U/L   ALT 10 0 - 35 U/L   Total Protein 6.9 6.0 - 8.3 g/dL   Albumin 4.2 3.5 - 5.2 g/dL   Calcium 9.1 8.4 - 10.5 mg/dL  TSH  Result Value Ref Range   TSH 0.487 0.350 - 4.500 uIU/mL  CK  Result Value Ref Range   Total CK 219 (H) 7 - 177 U/L  Pathologist smear review  Result Value Ref Range   Path Review SEE NOTE   HIV antibody  Result Value Ref Range   HIV 1&2 Ab, 4th Generation NONREACTIVE NONREACTIVE  POCT CBC  Result Value Ref Range   WBC 2.8 (A) 4.6 - 10.2 K/uL   Lymph, poc 1.2 0.6 - 3.4   POC LYMPH PERCENT 44.4 10 - 50 %L   MID (cbc) 0.2 0 - 0.9   POC MID % 8.2 0 - 12 %M   POC Granulocyte 1.3 (A) 2 - 6.9   Granulocyte  percent 47.4 37 - 80 %G   RBC 4.70 4.04 - 5.48 M/uL   Hemoglobin 13.0 12.2 - 16.2 g/dL   HCT, POC 40.9 37.7 - 47.9 %   MCV 87.1 80 - 97 fL   MCH, POC 27.7 27 - 31.2 pg   MCHC 31.8 31.8 - 35.4 g/dL   RDW, POC 14.6 %   Platelet Count, POC 275 142 - 424 K/uL   MPV 6.2 0 - 99.8 fL  POCT UA - Microscopic Only  Result Value Ref Range   WBC, Ur, HPF, POC 1-3    RBC, urine, microscopic neg    Bacteria, U Microscopic neg    Mucus, UA pos    Epithelial cells, urine per micros 1-2    Crystals, Ur, HPF, POC neg    Casts, Ur, LPF, POC neg    Yeast, UA neg   POCT urinalysis dipstick  Result Value Ref Range   Color, UA yellow    Clarity, UA clear    Glucose, UA neg    Bilirubin, UA neg    Ketones, UA neg    Spec Grav, UA 1.015    Blood, UA trace-lysed    pH, UA 5.5    Protein, UA neg    Urobilinogen, UA 0.2    Nitrite, UA neg    Leukocytes, UA Trace   POCT SEDIMENTATION RATE  Result Value Ref Range   POCT SED RATE 12 0 - 22 mm/hr  POCT glucose (manual entry)  Result Value Ref Range   POC Glucose 81 70 - 99 mg/dl

## 2015-02-01 LAB — CK: Total CK: 219 U/L — ABNORMAL HIGH (ref 7–177)

## 2015-02-01 LAB — COMPREHENSIVE METABOLIC PANEL
ALBUMIN: 4.2 g/dL (ref 3.5–5.2)
ALT: 10 U/L (ref 0–35)
AST: 21 U/L (ref 0–37)
Alkaline Phosphatase: 66 U/L (ref 39–117)
BUN: 10 mg/dL (ref 6–23)
CALCIUM: 9.1 mg/dL (ref 8.4–10.5)
CO2: 26 meq/L (ref 19–32)
Chloride: 108 mEq/L (ref 96–112)
Creat: 0.65 mg/dL (ref 0.50–1.10)
Glucose, Bld: 87 mg/dL (ref 70–99)
POTASSIUM: 3.8 meq/L (ref 3.5–5.3)
SODIUM: 143 meq/L (ref 135–145)
TOTAL PROTEIN: 6.9 g/dL (ref 6.0–8.3)
Total Bilirubin: 0.5 mg/dL (ref 0.2–1.2)

## 2015-02-01 LAB — TSH: TSH: 0.487 u[IU]/mL (ref 0.350–4.500)

## 2015-02-01 LAB — HIV ANTIBODY (ROUTINE TESTING W REFLEX): HIV 1&2 Ab, 4th Generation: NONREACTIVE

## 2015-02-02 LAB — PATHOLOGIST SMEAR REVIEW

## 2015-02-05 ENCOUNTER — Ambulatory Visit (INDEPENDENT_AMBULATORY_CARE_PROVIDER_SITE_OTHER): Payer: BLUE CROSS/BLUE SHIELD | Admitting: Family Medicine

## 2015-02-05 VITALS — BP 120/88 | HR 80 | Temp 97.9°F | Resp 20 | Ht 64.25 in | Wt 165.4 lb

## 2015-02-05 DIAGNOSIS — Z79899 Other long term (current) drug therapy: Secondary | ICD-10-CM

## 2015-02-05 DIAGNOSIS — M609 Myositis, unspecified: Secondary | ICD-10-CM | POA: Diagnosis not present

## 2015-02-05 DIAGNOSIS — E559 Vitamin D deficiency, unspecified: Secondary | ICD-10-CM | POA: Diagnosis not present

## 2015-02-05 DIAGNOSIS — I10 Essential (primary) hypertension: Secondary | ICD-10-CM

## 2015-02-05 DIAGNOSIS — G4700 Insomnia, unspecified: Secondary | ICD-10-CM | POA: Diagnosis not present

## 2015-02-05 DIAGNOSIS — M791 Myalgia: Secondary | ICD-10-CM | POA: Diagnosis not present

## 2015-02-05 DIAGNOSIS — F39 Unspecified mood [affective] disorder: Secondary | ICD-10-CM

## 2015-02-05 DIAGNOSIS — G43011 Migraine without aura, intractable, with status migrainosus: Secondary | ICD-10-CM

## 2015-02-05 DIAGNOSIS — IMO0001 Reserved for inherently not codable concepts without codable children: Secondary | ICD-10-CM

## 2015-02-05 MED ORDER — ERGOCALCIFEROL 1.25 MG (50000 UT) PO CAPS
50000.0000 [IU] | ORAL_CAPSULE | ORAL | Status: DC
Start: 2015-02-05 — End: 2015-09-01

## 2015-02-05 MED ORDER — LISINOPRIL 20 MG PO TABS
20.0000 mg | ORAL_TABLET | Freq: Every day | ORAL | Status: DC
Start: 2015-02-05 — End: 2015-04-29

## 2015-02-05 MED ORDER — TRAMADOL HCL 50 MG PO TABS: 50.0000 mg | ORAL_TABLET | Freq: Three times a day (TID) | ORAL | Status: DC | PRN

## 2015-02-05 MED ORDER — PROMETHAZINE HCL 25 MG/ML IJ SOLN
25.0000 mg | Freq: Once | INTRAMUSCULAR | Status: AC
  Administered 2015-02-05: 25 mg via INTRAMUSCULAR

## 2015-02-05 MED ORDER — KETOROLAC TROMETHAMINE 60 MG/2ML IM SOLN
60.0000 mg | Freq: Once | INTRAMUSCULAR | Status: AC
  Administered 2015-02-05: 60 mg via INTRAMUSCULAR

## 2015-02-05 MED ORDER — BUTALBITAL-APAP-CAFFEINE 50-325-40 MG PO TABS: 1.0000 | ORAL_TABLET | Freq: Four times a day (QID) | ORAL | Status: DC | PRN

## 2015-02-05 MED ORDER — DULOXETINE HCL 30 MG PO CPEP: 30.0000 mg | ORAL_CAPSULE | Freq: Every day | ORAL | Status: DC

## 2015-02-05 MED ORDER — METHYLPREDNISOLONE ACETATE 80 MG/ML IJ SUSP
40.0000 mg | Freq: Once | INTRAMUSCULAR | Status: AC
Start: 2015-02-05 — End: 2015-02-05
  Administered 2015-02-05: 40 mg via INTRAMUSCULAR

## 2015-02-05 NOTE — Patient Instructions (Addendum)
Start the cymbalta every morning.  Ok to hold off on the flexeril for now - we will likely restart this in the future but I want you to be able to get an idea of what medicines are helping and which aren't. After you are on this for a couple of days as long as you are not having side effects then start a 2-3mg  melatonin supplement around 6 p.m.  Then do something relaxing and think about heading towards bed at 7p.m.  You have to turn off the tv screen in your room.  You can listen to talk radio or music but NO SCREENS in the BEDROOM.  Whenever you go to bed, if you are not asleep within 5-10 minutes than get up and go do something relaxing again (remember, still no screens).  If you are having things circle around your mind in the evening, then write them down.  Consider journaling, coloring, meditating, praying - etc.  Consider warm bath or have your husband massage your back or feet (doctor's orders ;)!)   You do not need to take the flexeril for now if you do not have less pain after taking it. We will retry this med later.  Insomnia Insomnia is frequent trouble falling and/or staying asleep. Insomnia can be a long term problem or a short term problem. Both are common. Insomnia can be a short term problem when the wakefulness is related to a certain stress or worry. Long term insomnia is often related to ongoing stress during waking hours and/or poor sleeping habits. Overtime, sleep deprivation itself can make the problem worse. Every little thing feels more severe because you are overtired and your ability to cope is decreased. CAUSES   Stress, anxiety, and depression.  Poor sleeping habits.  Distractions such as TV in the bedroom.  Naps close to bedtime.  Engaging in emotionally charged conversations before bed.  Technical reading before sleep.  Alcohol and other sedatives. They may make the problem worse. They can hurt normal sleep patterns and normal dream activity.  Stimulants such as  caffeine for several hours prior to bedtime.  Pain syndromes and shortness of breath can cause insomnia.  Exercise late at night.  Changing time zones may cause sleeping problems (jet lag). It is sometimes helpful to have someone observe your sleeping patterns. They should look for periods of not breathing during the night (sleep apnea). They should also look to see how long those periods last. If you live alone or observers are uncertain, you can also be observed at a sleep clinic where your sleep patterns will be professionally monitored. Sleep apnea requires a checkup and treatment. Give your caregivers your medical history. Give your caregivers observations your family has made about your sleep.  SYMPTOMS   Not feeling rested in the morning.  Anxiety and restlessness at bedtime.  Difficulty falling and staying asleep. TREATMENT   Your caregiver may prescribe treatment for an underlying medical disorders. Your caregiver can give advice or help if you are using alcohol or other drugs for self-medication. Treatment of underlying problems will usually eliminate insomnia problems.  Medications can be prescribed for short time use. They are generally not recommended for lengthy use.  Over-the-counter sleep medicines are not recommended for lengthy use. They can be habit forming.  You can promote easier sleeping by making lifestyle changes such as:  Using relaxation techniques that help with breathing and reduce muscle tension.  Exercising earlier in the day.  Changing your diet and the  time of your last meal. No night time snacks.  Establish a regular time to go to bed.  Counseling can help with stressful problems and worry.  Soothing music and white noise may be helpful if there are background noises you cannot remove.  Stop tedious detailed work at least one hour before bedtime. HOME CARE INSTRUCTIONS   Keep a diary. Inform your caregiver about your progress. This includes any  medication side effects. See your caregiver regularly. Take note of:  Times when you are asleep.  Times when you are awake during the night.  The quality of your sleep.  How you feel the next day. This information will help your caregiver care for you.  Get out of bed if you are still awake after 15 minutes. Read or do some quiet activity. Keep the lights down. Wait until you feel sleepy and go back to bed.  Keep regular sleeping and waking hours. Avoid naps.  Exercise regularly.  Avoid distractions at bedtime. Distractions include watching television or engaging in any intense or detailed activity like attempting to balance the household checkbook.  Develop a bedtime ritual. Keep a familiar routine of bathing, brushing your teeth, climbing into bed at the same time each night, listening to soothing music. Routines increase the success of falling to sleep faster.  Use relaxation techniques. This can be using breathing and muscle tension release routines. It can also include visualizing peaceful scenes. You can also help control troubling or intruding thoughts by keeping your mind occupied with boring or repetitive thoughts like the old concept of counting sheep. You can make it more creative like imagining planting one beautiful flower after another in your backyard garden.  During your day, work to eliminate stress. When this is not possible use some of the previous suggestions to help reduce the anxiety that accompanies stressful situations. MAKE SURE YOU:   Understand these instructions.  Will watch your condition.  Will get help right away if you are not doing well or get worse. Document Released: 09/20/2000 Document Revised: 12/16/2011 Document Reviewed: 10/21/2007 [facility] Patient Information 2015 [place], [place]. This information is not intended to replace advice given to you by your health care provider. Make sure you discuss any questions you have with your health care  provider.

## 2015-02-05 NOTE — Progress Notes (Signed)
Subjective:  This chart was scribed for [name], MD by [facility], medical scribe at [facility].The patient was seen in exam room 14 and the patient's care was started at 9:22 AM.   Patient ID: Diane [name], female    DOB: [birth year], 59 y.o.   MRN: [id number] Chief Complaint  Patient presents with  . Follow-up    slight headache and body is still sore   HPI HPI Comments: Diane [name] is a 59 y.o. female who presents to [facility] for a follow up. Established care 1 week ago, history of severe migraines, social anxiety and complicated mediation sensitivity.  Pt was taking metoprolol for migraine prevention and hypertension, she is felt very tired. She was seen at the [facility] previously without benefit. Given Imitrex prior which helped but discontinued when told it can lead to stroke. On Zomig and Topamax for no benefit. Occasionally took tramadol which helped. Pt was taken off metoprolol due to its fatigue, started on lisinopril 10 mg daily and Diovane 10 mg three times a day, Imitrex 50 mg and elavil 10 mg. Seen follow up four days later, blood pressure was excellent but side effects significantly worse. Her headache was mildly improved  but still present. She is complaining of insomnia, diffuse myalgias and fatigue.She denied suicidal ideation but kept moaning and crying "I cant go on". Discussed she could have fibromyalgia likely exacerbated by her headache and mood disorders anxiety and depression. Also have several medication sensitivity  complicating this. Taken off Diovane and continued on lisinopril, continued elavil 10 and added in qhs flexeril.   Complete lab work up at last visit. Chronic leukopenic which appears benign but we may need to consider hematology. Referred to neurology for headaches but she did not want to go. Urine was normal, Sed rate was normal, glucose was normal, history of low potassium but this  was normalized, mildly increased CK likely subclinical that was slightly improved. Negative HIV test, low vitamin D at 17. Pt is due for pap smear, mammogram, likely needs tetanus and colonoscopy  Thursday felt great no headache, felt like she could "walk 100 miles". She has not felt this good in several months. Pt does not feel worse during certain seasons. Friday was bad she was in pain and felt very tired and down. She had a headache on Friday she took two Imitrex and one of her husbands tramadol which helped. This helped her headache and body aches. Her energy returned and she was able to work in the yard. She says the flexeril is not helpful she takes two on in the afternoon and one at night. Pt says she is occasionally depressed. Pt is not sleeping well at all, she has never taken melatonin. Work scheduled change she works at [employer] AM to 12:00 PM. Pt typically wakes up at 2:30 AM. Typically works in her yard after work. She loves her garden, she plants flower, tomatoes, cucumbers and bell peppers. She does not enjoy watching TV too often.  Pt state certain sounds and smells will trigger her migraines.   Past Medical History  Diagnosis Date  . Hypertension   . XKGYJEHU(314.9)    Current Outpatient Prescriptions on File Prior to Visit  Medication Sig Dispense Refill  . amitriptyline (ELAVIL) 10 MG tablet Take 1 tablet (10 mg total) by mouth at bedtime. 30 tablet 0  . cyclobenzaprine (FLEXERIL) 10 MG tablet Take 1 tablet (10 mg total)  by mouth 3 (three) times daily as needed for muscle spasms. 30 tablet 0  . lisinopril (PRINIVIL,ZESTRIL) 10 MG tablet Take 1 tablet (10 mg total) by mouth daily. 30 tablet 0  . promethazine (PHENERGAN) 25 MG tablet Take 1 tablet (25 mg total) by mouth every 8 (eight) hours as needed for nausea or vomiting. 20 tablet 0  . SUMAtriptan (IMITREX) 50 MG tablet Take 1 tablet (50 mg total) by mouth once. May repeat in 2 hours if headache persists or recurs. 10 tablet 0    No current facility-administered medications on file prior to visit.   Allergies  Allergen Reactions  . Topamax [Topiramate] Hives   Review of Systems  Constitutional: Positive for fatigue.  Musculoskeletal: Positive for myalgias.  Neurological: Positive for headaches.  Psychiatric/Behavioral: Positive for sleep disturbance. Negative for suicidal ideas. The patient is nervous/anxious.       Objective:  BP 120/88 mmHg  Pulse 80  Temp(Src) 97.9 F (36.6 C) (Oral)  Resp 20  Ht 5' 4.25" (1.632 m)  Wt 165 lb 6 oz (75.014 kg)  BMI 28.16 kg/m2  SpO2 98% Physical Exam  Constitutional: She is oriented to person, place, and time. She appears well-developed and well-nourished. No distress.  HENT:  Head: Normocephalic and atraumatic.  Eyes: Pupils are equal, round, and reactive to light.  Neck: Normal range of motion.  Cardiovascular: Normal rate, regular rhythm, S1 normal, S2 normal and normal heart sounds.   Pulmonary/Chest: Effort normal and breath sounds normal. No respiratory distress.  Musculoskeletal: Normal range of motion.  Neurological: She is alert and oriented to person, place, and time.  Skin: Skin is warm and dry.  Psychiatric: She has a normal mood and affect. Her behavior is normal.  Nursing note and vitals reviewed.     Assessment & Plan:   1. Intractable migraine without aura and with status migrainosus   2. Essential hypertension   3. Insomnia   4. Polypharmacy   5. Mood disorder   6. Myalgia and myositis   7. Vitamin D deficiency       Meds ordered this encounter  Medications  . ergocalciferol (VITAMIN D2) 50000 UNITS capsule    Sig: Take 1 capsule (50,000 Units total) by mouth once a week.    Dispense:  12 capsule    Refill:  1  . DULoxetine (CYMBALTA) 30 MG capsule    Sig: Take 1 capsule (30 mg total) by mouth daily.    Dispense:  30 capsule    Refill:  0  . traMADol (ULTRAM) 50 MG tablet    Sig: Take 1 tablet (50 mg total) by mouth every 8  (eight) hours as needed.    Dispense:  30 tablet    Refill:  1  . butalbital-acetaminophen-caffeine (FIORICET) 50-325-40 MG per tablet    Sig: Take 1-2 tablets by mouth every 6 (six) hours as needed for headache.    Dispense:  20 tablet    Refill:  0  . ketorolac (TORADOL) injection 60 mg    Sig:   . promethazine (PHENERGAN) injection 25 mg    Sig:   . methylPREDNISolone acetate (DEPO-MEDROL) injection 40 mg    Sig:   . lisinopril (PRINIVIL,ZESTRIL) 20 MG tablet    Sig: Take 1 tablet (20 mg total) by mouth daily.    Dispense:  30 tablet    Refill:  1    I personally performed the services described in this documentation, which was scribed in my presence. The recorded  information has been reviewed and considered, and addended by me as needed.  [name], MD MPH

## 2015-03-01 ENCOUNTER — Telehealth: Payer: Self-pay | Admitting: *Deleted

## 2015-03-01 NOTE — Telephone Encounter (Signed)
Spoke to the pt on the phone and she told me that she would prefer to see Dr. Leta Baptist, but when she was referred back to the office, she could not remember his name. I was able to get her appt changed to June 2nd at 4 pm. She could only do afternoon appts. She also asked about her husband getting a NP appt. I told her I did not see anything in his chart and I transferred her to [place] the NP referral coordinator. She thanked me for calling

## 2015-03-02 ENCOUNTER — Ambulatory Visit: Payer: BLUE CROSS/BLUE SHIELD | Admitting: Neurology

## 2015-03-09 ENCOUNTER — Ambulatory Visit (INDEPENDENT_AMBULATORY_CARE_PROVIDER_SITE_OTHER): Payer: 59 | Admitting: Diagnostic Neuroimaging

## 2015-03-09 ENCOUNTER — Encounter: Payer: Self-pay | Admitting: Diagnostic Neuroimaging

## 2015-03-09 VITALS — BP 174/99 | HR 64 | Ht 64.0 in | Wt 165.7 lb

## 2015-03-09 DIAGNOSIS — G43001 Migraine without aura, not intractable, with status migrainosus: Secondary | ICD-10-CM

## 2015-03-09 MED ORDER — RIZATRIPTAN BENZOATE 10 MG PO TBDP: 10.0000 mg | ORAL_TABLET | ORAL | Status: DC | PRN

## 2015-03-09 MED ORDER — AMITRIPTYLINE HCL 25 MG PO TABS: 25.0000 mg | ORAL_TABLET | Freq: Every day | ORAL | Status: DC

## 2015-03-09 NOTE — Patient Instructions (Addendum)
Increase amitriptyline to 25mg  at bedtime.  Change sumatriptan to rizatriptan.  Focus on sleep hygeine techniques.  Follow up with PCP re: high blood pressure (177/99).  Consider seeing therapist/counselor re: anxiety/insomnia.

## 2015-03-09 NOTE — Progress Notes (Signed)
[facility]  PATIENT: Diane [name] DOB: 08-29-1956  REFERRING CLINICIAN: Dwana Curd HISTORY FROM: patient REASON FOR VISIT: follow up   HISTORICAL  CHIEF COMPLAINT:  Chief Complaint  Patient presents with  . Follow-up    migraine    HISTORY OF PRESENT ILLNESS:   UPDATE 03/09/15: Since last visit, did not follow up in our office. Continues to have frequent migraine, uncontrolled HTN, insomnia, anxiety, depression symptoms. Avg 3-4 HA per week. Sumatriptan not helping. HA have throbbing pain, nausea, sens to light. Sometimes wakes up with HA.   UPDATE 12/31/12: Since last visit, patient tried Topamax 50 mg twice a day for 3 weeks without relief. During that time she stopped her Excedrin Migraine. Patient developed itching sensation of blurred vision. Therefore patient stopped taking topiramate. She is now back to one to 2 tablets of Excedrin Migraine per day. She takes one to 2 tablets of tramadol up to 10 days per month. Patient still has significant insomnia problems. She is only getting 3-4 hours of sleep per night. Patient tries to fall asleep with her TV turned on in her bedroom.  PRIOR HPI 10/06/12: 59 year old right-handed female with history of hypertension, here for evaluation of migraines. Patient has history of migraines for past 10 years. She [facility]. She describes pounding, severe central and left-sided headaches, with nausea and phonophobia. No vomiting or photophobia. No prodromal visual or as, focal neurologic symptoms with her headaches. She has tried Imitrex, imipramine, Skelaxin in the past without significant relief. Imipramine caused hallucination and nightmare. Currently she is on metoprolol, cyclobenzaprine/tramadol one to 2 tablets every night, Excedrin Migraine 2 tablets per day, BC powder 0-2 per day. She has almost daily headaches which are quite severe and wake her up from sleep around  3:00 in the morning. She also has insomnia and poor sleep quality. Her referring doctor has advised her to discontinue Imitrex use because of her age and postmenopausal status.   REVIEW OF SYSTEMS: Full 14 system review of systems performed and notable only for skin moles, feeling hot, feeling cold, joint pain, aching muscles, headache, poor sleep, insomnia, joint pain.  ALLERGIES: Allergies  Allergen Reactions  . Topamax [Topiramate] Hives    HOME MEDICATIONS: Outpatient Prescriptions Prior to Visit  Medication Sig Dispense Refill  . butalbital-acetaminophen-caffeine (FIORICET) 50-325-40 MG per tablet Take 1-2 tablets by mouth every 6 (six) hours as needed for headache. 20 tablet 0  . DULoxetine (CYMBALTA) 30 MG capsule Take 1 capsule (30 mg total) by mouth daily. 30 capsule 0  . ergocalciferol (VITAMIN D2) 50000 UNITS capsule Take 1 capsule (50,000 Units total) by mouth once a week. 12 capsule 1  . lisinopril (PRINIVIL,ZESTRIL) 20 MG tablet Take 1 tablet (20 mg total) by mouth daily. 30 tablet 1  . promethazine (PHENERGAN) 25 MG tablet Take 1 tablet (25 mg total) by mouth every 8 (eight) hours as needed for nausea or vomiting. 20 tablet 0  . traMADol (ULTRAM) 50 MG tablet Take 1 tablet (50 mg total) by mouth every 8 (eight) hours as needed. 30 tablet 1  . amitriptyline (ELAVIL) 10 MG tablet Take 1 tablet (10 mg total) by mouth at bedtime. 30 tablet 0  . SUMAtriptan (IMITREX) 50 MG tablet Take 1 tablet (50 mg total) by mouth once. May repeat in 2 hours if headache persists or recurs. 10 tablet 0  . cyclobenzaprine (FLEXERIL) 10 MG tablet Take 1 tablet (10 mg total)  by mouth 3 (three) times daily as needed for muscle spasms. 30 tablet 0   No facility-administered medications prior to visit.    PAST MEDICAL HISTORY: Past Medical History  Diagnosis Date  . Hypertension   . Headache(784.0)     PAST SURGICAL HISTORY: History reviewed. No pertinent past surgical history.  FAMILY  HISTORY: Family History  Problem Relation Age of Onset  . Healthy Mother     SOCIAL HISTORY:  History   Social History  . Marital Status: Single    Spouse Name: [name]  . Number of Children: 4  . Years of Education: 12   Occupational History  .      [employer]   Social History Main Topics  . Smoking status: Former Smoker    Quit date: 10/08/1987  . Smokeless tobacco: Not on file  . Alcohol Use: 0.0 oz/week    0 Standard drinks or equivalent per week     Comment: 1 glass of wine occasionally  . Drug Use: No  . Sexual Activity: Not on file   Other Topics Concern  . Not on file   Social History Narrative   Pt lives at home with her husband, daughter, and 2 grandchildren.    She has four children    drinks one cup of coffee daily.      PHYSICAL EXAM  Filed Vitals:   03/09/15 1549 03/09/15 1558  BP: 171/100 174/99  Pulse: 89 64  Height: 5\' 4"  (1.626 m)   Weight: 165 lb 11.2 oz (75.161 kg)    Body mass index is 28.43 kg/(m^2).  GENERAL EXAM: Patient is in no distress  CARDIOVASCULAR: Regular rate and rhythm, no murmurs, no carotid bruits  NEUROLOGIC: MENTAL STATUS: awake, alert, language fluent, comprehension intact, naming intact; SLIGHTLY DEPRESSED AFFECT. CRANIAL NERVE: no papilledema on fundoscopic exam, pupils equal and reactive to light, visual fields full to confrontation, extraocular muscles intact, no nystagmus, facial sensation and strength symmetric, uvula midline, shoulder shrug symmetric, tongue midline. MOTOR: normal bulk and tone, full strength in the BUE, BLE SENSORY: normal and symmetric to light touch, temperature, vibration COORDINATION: finger-nose-finger, fine finger movements normal REFLEXES: deep tendon reflexes present and symmetric GAIT/STATION: narrow based gait; able to walk on toes, heels and tandem; romberg is negative    DIAGNOSTIC DATA (LABS, IMAGING, TESTING) - I reviewed patient records, labs, notes, testing and imaging myself  where available.  Lab Results  Component Value Date   WBC 2.8* 01/31/2015      Component Value Date/Time   NA 143 01/31/2015 1636   No results found for: CHOL No results found for: HGBA1C No results found for: VITAMINB12 Lab Results  Component Value Date   TSH 0.487 01/31/2015     ASSESSMENT AND PLAN  59 y.o. year old female  has a past medical history of Hypertension and Headache(784.0). here with:   Dx: chronic migraine + insomnia + anxiety/depression   PLAN:  Patient Instructions  Increase amitriptyline to 25mg  at bedtime.  Change sumatriptan to rizatriptan.  Focus on sleep hygeine techniques.  Follow up with PCP re: high blood pressure (177/99).  Consider seeing therapist/counselor re: anxiety/insomnia.   Return in about 6 weeks (around 04/20/2015).     Penni Bombard, MD 04/08/7105, 2:69 PM Certified in Neurology, Neurophysiology and Neuroimaging  [facility] [address] [place], [place] [address] [phone]

## 2015-04-24 ENCOUNTER — Ambulatory Visit: Payer: BLUE CROSS/BLUE SHIELD | Admitting: Diagnostic Neuroimaging

## 2015-04-25 ENCOUNTER — Encounter: Payer: Self-pay | Admitting: Diagnostic Neuroimaging

## 2015-04-29 ENCOUNTER — Other Ambulatory Visit: Payer: Self-pay | Admitting: Family Medicine

## 2015-06-05 ENCOUNTER — Ambulatory Visit (INDEPENDENT_AMBULATORY_CARE_PROVIDER_SITE_OTHER): Payer: 59 | Admitting: Family Medicine

## 2015-06-05 VITALS — BP 180/90 | HR 85 | Temp 98.1°F | Resp 20 | Ht 64.0 in | Wt 166.0 lb

## 2015-06-05 DIAGNOSIS — R351 Nocturia: Secondary | ICD-10-CM

## 2015-06-05 DIAGNOSIS — I10 Essential (primary) hypertension: Secondary | ICD-10-CM | POA: Diagnosis not present

## 2015-06-05 DIAGNOSIS — G43011 Migraine without aura, intractable, with status migrainosus: Secondary | ICD-10-CM

## 2015-06-05 DIAGNOSIS — R05 Cough: Secondary | ICD-10-CM | POA: Diagnosis not present

## 2015-06-05 DIAGNOSIS — R0982 Postnasal drip: Secondary | ICD-10-CM

## 2015-06-05 DIAGNOSIS — R059 Cough, unspecified: Secondary | ICD-10-CM

## 2015-06-05 DIAGNOSIS — G4700 Insomnia, unspecified: Secondary | ICD-10-CM

## 2015-06-05 MED ORDER — PROMETHAZINE HCL 25 MG/ML IJ SOLN
25.0000 mg | Freq: Once | INTRAMUSCULAR | Status: AC
  Administered 2015-06-05: 25 mg via INTRAMUSCULAR

## 2015-06-05 MED ORDER — BUTALBITAL-APAP-CAFFEINE 50-325-40 MG PO TABS: 1.0000 | ORAL_TABLET | Freq: Four times a day (QID) | ORAL | Status: DC | PRN

## 2015-06-05 MED ORDER — DULOXETINE HCL 40 MG PO CPEP: 40.0000 mg | ORAL_CAPSULE | Freq: Every day | ORAL | Status: DC

## 2015-06-05 MED ORDER — CETIRIZINE HCL 10 MG PO TABS: 10.0000 mg | ORAL_TABLET | Freq: Every day | ORAL | Status: DC

## 2015-06-05 MED ORDER — FLUTICASONE PROPIONATE 50 MCG/ACT NA SUSP: 2.0000 | Freq: Every day | NASAL | Status: DC

## 2015-06-05 MED ORDER — LISINOPRIL 20 MG PO TABS: 20.0000 mg | ORAL_TABLET | Freq: Every day | ORAL | Status: DC

## 2015-06-05 MED ORDER — AMLODIPINE BESYLATE 5 MG PO TABS: 5.0000 mg | ORAL_TABLET | Freq: Every day | ORAL | Status: DC

## 2015-06-05 MED ORDER — TRAMADOL HCL 50 MG PO TABS: 50.0000 mg | ORAL_TABLET | Freq: Three times a day (TID) | ORAL | Status: DC | PRN

## 2015-06-05 MED ORDER — PROMETHAZINE HCL 25 MG PO TABS: 25.0000 mg | ORAL_TABLET | Freq: Three times a day (TID) | ORAL | Status: DC | PRN

## 2015-06-05 MED ORDER — KETOROLAC TROMETHAMINE 60 MG/2ML IM SOLN
60.0000 mg | Freq: Once | INTRAMUSCULAR | Status: AC
  Administered 2015-06-05: 60 mg via INTRAMUSCULAR

## 2015-06-05 NOTE — Patient Instructions (Signed)
The most common causes of chronic cough in immunocompetent adults include the following: upper airway cough syndrome (UACS), previously referred to as postnasal drip syndrome (PNDS), which is caused by variety of rhinosinus conditions.  Most likely this is Classic Upper airway cough syndrome, so named because it's frequently impossible to sort out how much is chronic rhinitis/sinusitis with freq throat clearing.  Try at night allergy medications since this is when cough is the worst - generic zyrtec is best but claritin or allegra generic would be fine, benadryl is ok if it does not make you to tired but needs to be redosed during the day.  Start the flonase at night - spray it with the tip pointed to your ears while looking at your toes and turning up your nose. If things are not improving, please come back for labs and chest xray. Suppress your cough and throat clearing with candy and delsym, not throat lozenges.  Managing Your High Blood Pressure Blood pressure is a measurement of how forceful your blood is pressing against the walls of the arteries. Arteries are muscular tubes within the circulatory system. Blood pressure does not stay the same. Blood pressure rises when you are active, excited, or nervous; and it lowers during sleep and relaxation. If the numbers measuring your blood pressure stay above normal most of the time, you are at risk for health problems. High blood pressure (hypertension) is a long-term (chronic) condition in which blood pressure is elevated. A blood pressure reading is recorded as two numbers, such as 120 over 80 (or 120/80). The first, higher number is called the systolic pressure. It is a measure of the pressure in your arteries as the heart beats. The second, lower number is called the diastolic pressure. It is a measure of the pressure in your arteries as the heart relaxes between beats.  Keeping your blood pressure in a normal range is important to your overall health and  prevention of health problems, such as heart disease and stroke. When your blood pressure is uncontrolled, your heart has to work harder than normal. High blood pressure is a very common condition in adults because blood pressure tends to rise with age. Men and women are equally likely to have hypertension but at different times in life. Before age 32, men are more likely to have hypertension. After 58 years of age, women are more likely to have it. Hypertension is especially common in African Americans. This condition often has no signs or symptoms. The cause of the condition is usually not known. Your caregiver can help you come up with a plan to keep your blood pressure in a normal, healthy range. BLOOD PRESSURE STAGES Blood pressure is classified into four stages: normal, prehypertension, stage 1, and stage 2. Your blood pressure reading will be used to determine what type of treatment, if any, is necessary. Appropriate treatment options are tied to these four stages:  Normal  Systolic pressure (mm Hg): below 120.  Diastolic pressure (mm Hg): below 80. Prehypertension  Systolic pressure (mm Hg): 120 to 139.  Diastolic pressure (mm Hg): 80 to 89. Stage1  Systolic pressure (mm Hg): 140 to 159.  Diastolic pressure (mm Hg): 90 to 99. Stage2  Systolic pressure (mm Hg): 160 or above.  Diastolic pressure (mm Hg): 100 or above. RISKS RELATED TO HIGH BLOOD PRESSURE Managing your blood pressure is an important responsibility. Uncontrolled high blood pressure can lead to:  A heart attack.  A stroke.  A weakened blood vessel (aneurysm).  Heart failure.  Kidney damage.  Eye damage.  Metabolic syndrome.  Memory and concentration problems. HOW TO MANAGE YOUR BLOOD PRESSURE Blood pressure can be managed effectively with lifestyle changes and medicines (if needed). Your caregiver will help you come up with a plan to bring your blood pressure within a normal range. Your plan should  include the following: Education  Read all information provided by your caregivers about how to control blood pressure.  Educate yourself on the latest guidelines and treatment recommendations. New research is always being done to further define the risks and treatments for high blood pressure. Lifestylechanges  Control your weight.  Avoid smoking.  Stay physically active.  Reduce the amount of salt in your diet.  Reduce stress.  Control any chronic conditions, such as high cholesterol or diabetes.  Reduce your alcohol intake. Medicines  Several medicines (antihypertensive medicines) are available, if needed, to bring blood pressure within a normal range. Communication  Review all the medicines you take with your caregiver because there may be side effects or interactions.  Talk with your caregiver about your diet, exercise habits, and other lifestyle factors that may be contributing to high blood pressure.  See your caregiver regularly. Your caregiver can help you create and adjust your plan for managing high blood pressure. RECOMMENDATIONS FOR TREATMENT AND FOLLOW-UP  The following recommendations are based on current guidelines for managing high blood pressure in nonpregnant adults. Use these recommendations to identify the proper follow-up period or treatment option based on your blood pressure reading. You can discuss these options with your caregiver.  Systolic pressure of 381 to 829 or diastolic pressure of 80 to 89: Follow up with your caregiver as directed.  Systolic pressure of 937 to 169 or diastolic pressure of 90 to 100: Follow up with your caregiver within 2 months.  Systolic pressure above 678 or diastolic pressure above 938: Follow up with your caregiver within 1 month.  Systolic pressure above 101 or diastolic pressure above 751: Consider antihypertensive therapy; follow up with your caregiver within 1 week.  Systolic pressure above 025 or diastolic  pressure above 852: Begin antihypertensive therapy; follow up with your caregiver within 1 week. Document Released: 06/17/2012 Document Reviewed: 06/17/2012 [facility] Patient Information 2015 [employer]. This information is not intended to replace advice given to you by your health care provider. Make sure you discuss any questions you have with your health care provider.

## 2015-06-05 NOTE — Progress Notes (Signed)
Subjective:  This chart was scribed for [name], MD by [facility], medical scribe at [facility].The patient was seen in exam room 13 and the patient's care was started at 6:44 PM.   Patient ID: Diane [name], female    DOB: 05-Dec-1955, 59 y.o.   MRN: [id number] Chief Complaint  Patient presents with  . Follow-up    follow up from june appt with Dr. Brigitte Pulse  . Medication Refill    BP meds, cymbalta and tramadol--- declined the flu shot today   HPI HPI Comments: Diane [name] is a 59 y.o. female history of low wbc. 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 She also needs several medications refilled today.  CK elevated at 230 decreased to 219 4 days later. Seen by neuro, Dr. Leta Baptist 3 months ago, increased amitriptyline and changed her sumatriptan to rizatriptan. recommended counseling. BP has been uncontrolled.   HTN: Taking lisinopril 20 mg daily, not checking her BP at home. Side effects while on metoprolol and valsartan. No urinary symptoms.   Migraine: Headaches have improved, but she does not like the Maxalt because of the taste. Imitrex in the past did not work. Missed neurology appointment because her husband was involved in a car accident leaving him paralyzed. Also taking care of her mother. Phenergan as needed, taken last week which helped. Headache today she did take rizatriptan for little relief. She would like a shot today for her headache. No OTC medication tried today Excedrin in the past has helped. Tried 800 mg ibuprofen in the past for no relief.  Depression: Cymbalta 30 mg has been helpful, She has been doing ok. mood is appropriate for all the stress she is having. Sleep is so-so. Very active during the day and exhausted at night. Mind occasionally racing at night. Bought vitamins but has not taken any.  Cough: She  complains of intermittent cough for three week. No allergy symptoms, such as itchy eyes, rhinorrhea, sneezing. She does wake up with postnasal drip. No heart burn. Throat closing sensation lasting for a few seconds with a heart palpations. No trouble swallowing.  Depression screen [facility] 2/9 06/05/2015 02/05/2015  Decreased Interest 0 1  Down, Depressed, Hopeless 0 0  PHQ - 2 Score 0 1     Past Medical History  Diagnosis Date  . Hypertension   . [id number](119.4)    Current Outpatient Prescriptions on File Prior to Visit  Medication Sig Dispense Refill  . amitriptyline (ELAVIL) 25 MG tablet Take 1 tablet (25 mg total) by mouth at bedtime. 30 tablet 6  . butalbital-acetaminophen-caffeine (FIORICET) 50-325-40 MG per tablet Take 1-2 tablets by mouth every 6 (six) hours as needed for headache. 20 tablet 0  . DULoxetine (CYMBALTA) 30 MG capsule Take 1 capsule (30 mg total) by mouth daily. 30 capsule 0  . ergocalciferol (VITAMIN D2) 50000 UNITS capsule Take 1 capsule (50,000 Units total) by mouth once a week. 12 capsule 1  . lisinopril (PRINIVIL,ZESTRIL) 20 MG tablet TAKE 1 TABLET BY MOUTH EVERY DAY 30 tablet 1  . promethazine (PHENERGAN) 25 MG tablet Take 1 tablet (25 mg total) by mouth every 8 (eight) hours as needed for nausea or vomiting. 20 tablet 0  . rizatriptan (MAXALT-MLT) 10 MG disintegrating tablet Take 1 tablet (10 mg total) by mouth as needed for migraine. May repeat in 2 hours if needed 9  tablet 3  . traMADol (ULTRAM) 50 MG tablet Take 1 tablet (50 mg total) by mouth every 8 (eight) hours as needed. 30 tablet 1   No current facility-administered medications on file prior to visit.   Allergies  Allergen Reactions  . Topamax [Topiramate] Hives   Review of Systems  Constitutional: Positive for fatigue. Negative for activity change and appetite change.  HENT: Positive for postnasal drip. Negative for congestion, ear pain, facial swelling, rhinorrhea, sinus pressure, sneezing and trouble  swallowing.   Eyes: Negative for photophobia, redness, itching and visual disturbance.  Respiratory: Positive for cough and chest tightness.   Cardiovascular: Positive for palpitations. Negative for chest pain.  Gastrointestinal: Positive for nausea. Negative for vomiting and abdominal pain.  Genitourinary: Negative for dysuria, urgency, hematuria, decreased urine volume and enuresis.  Allergic/Immunologic: Negative for immunocompromised state.  Neurological: Positive for headaches.  Psychiatric/Behavioral: Positive for sleep disturbance and agitation. Negative for behavioral problems, confusion and dysphoric mood. The patient is nervous/anxious.       Objective:  BP 180/90 mmHg  Pulse 85  Temp(Src) 98.1 F (36.7 C) (Oral)  Resp 20  Ht 5\' 4"  (1.626 m)  Wt 166 lb (75.297 kg)  BMI 28.48 kg/m2  SpO2 98% Physical Exam  Constitutional: She is oriented to person, place, and time. She appears well-developed and well-nourished. No distress.  HENT:  Head: Normocephalic and atraumatic.  Right Ear: Tympanic membrane and external ear normal.  Left Ear: Tympanic membrane and external ear normal.  Nose: Mucosal edema present.  Mouth/Throat: Posterior oropharyngeal erythema present.  Positive clear postnasal drip.  Eyes: Pupils are equal, round, and reactive to light.  Neck: Normal range of motion. Neck supple. No thyromegaly present.  Cardiovascular: Normal rate, regular rhythm, S1 normal, S2 normal and normal heart sounds.  Exam reveals no gallop and no friction rub.   No murmur heard. Pulmonary/Chest: Effort normal and breath sounds normal. No respiratory distress. She has no wheezes. She has no rales.  Musculoskeletal: Normal range of motion.  Lymphadenopathy:    She has no cervical adenopathy.  Neurological: She is alert and oriented to person, place, and time.  Skin: Skin is warm and dry.  Psychiatric: She has a normal mood and affect. Her behavior is normal.  Nursing note and vitals  reviewed.      Assessment & Plan:   1. Intractable migraine without aura and with status migrainosus - seeing neurology Dr. Leta Baptist sev mos ago w/o sig improvement in HAs - failed imitrex and maxalt - will try nasal imitrex.  toradol 60mg  IMx 1 today.  Refilled tramadol, promethazine and fioricet.  cont cymbalta and qhs amitriptyline  2. Essential hypertension - needs to check bp outside office as pt has white coat HTN - cont lisinopril 40 (unsure why pt is on 20 bid), start amlodipine  3. Insomnia   4. Cough - suspect due to PND - start qhs oral antihistamine like zyrtec and nasal steroid. If cough cont, RTC for cxr/labs.  Use delsym  5. Postnasal drip   6. Nocturia     Meds ordered this encounter  Medications  . traMADol (ULTRAM) 50 MG tablet    Sig: Take 1 tablet (50 mg total) by mouth every 8 (eight) hours as needed.    Dispense:  30 tablet    Refill:  1  . promethazine (PHENERGAN) 25 MG tablet    Sig: Take 1 tablet (25 mg total) by mouth every 8 (eight) hours as needed for nausea or vomiting (  migraine headache).    Dispense:  20 tablet    Refill:  1  . ketorolac (TORADOL) injection 60 mg    Sig:   . promethazine (PHENERGAN) injection 25 mg    Sig:   . lisinopril (PRINIVIL,ZESTRIL) 20 MG tablet    Sig: Take 1 tablet (20 mg total) by mouth daily.    Dispense:  30 tablet    Refill:  2  . amLODipine (NORVASC) 5 MG tablet    Sig: Take 1 tablet (5 mg total) by mouth daily.    Dispense:  30 tablet    Refill:  2  . DULoxetine HCl 40 MG CPEP    Sig: Take 40 mg by mouth daily.    Dispense:  30 capsule    Refill:  5  . butalbital-acetaminophen-caffeine (FIORICET) 50-325-40 MG per tablet    Sig: Take 1-2 tablets by mouth every 6 (six) hours as needed for headache.    Dispense:  20 tablet    Refill:  0  . cetirizine (ZYRTEC) 10 MG tablet    Sig: Take 1 tablet (10 mg total) by mouth daily.    Dispense:  30 tablet    Refill:  2  . fluticasone (FLONASE) 50 MCG/ACT nasal spray      Sig: Place 2 sprays into both nostrils at bedtime.    Dispense:  16 g    Refill:  5    I personally performed the services described in this documentation, which was scribed in my presence. The recorded information has been reviewed and considered, and addended by me as needed.  [name], MD MPH

## 2015-06-21 ENCOUNTER — Other Ambulatory Visit: Payer: Self-pay | Admitting: Family Medicine

## 2015-07-17 ENCOUNTER — Other Ambulatory Visit: Payer: Self-pay

## 2015-07-17 MED ORDER — CETIRIZINE HCL 10 MG PO TABS: 10.0000 mg | ORAL_TABLET | Freq: Every day | ORAL | Status: DC

## 2015-08-13 NOTE — Addendum Note (Signed)
Addended by: [name] on: 08/13/2015 12:24 AM   Modules accepted: Miquel Dunn

## 2015-08-17 ENCOUNTER — Ambulatory Visit: Payer: 59 | Admitting: Family Medicine

## 2015-08-31 DIAGNOSIS — D72819 Decreased white blood cell count, unspecified: Secondary | ICD-10-CM | POA: Insufficient documentation

## 2015-08-31 DIAGNOSIS — F329 Major depressive disorder, single episode, unspecified: Secondary | ICD-10-CM | POA: Insufficient documentation

## 2015-08-31 DIAGNOSIS — Z79899 Other long term (current) drug therapy: Secondary | ICD-10-CM | POA: Insufficient documentation

## 2015-08-31 DIAGNOSIS — F32A Depression, unspecified: Secondary | ICD-10-CM | POA: Insufficient documentation
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
## 2015-09-01 ENCOUNTER — Encounter: Payer: Self-pay | Admitting: Family Medicine

## 2015-09-01 ENCOUNTER — Ambulatory Visit (INDEPENDENT_AMBULATORY_CARE_PROVIDER_SITE_OTHER): Payer: 59 | Admitting: Family Medicine

## 2015-09-01 ENCOUNTER — Other Ambulatory Visit: Payer: Self-pay | Admitting: Family Medicine

## 2015-09-01 VITALS — BP 128/83 | HR 84 | Temp 98.3°F | Resp 16 | Ht 63.73 in | Wt 159.2 lb

## 2015-09-01 DIAGNOSIS — E559 Vitamin D deficiency, unspecified: Secondary | ICD-10-CM

## 2015-09-01 DIAGNOSIS — M791 Myalgia: Secondary | ICD-10-CM | POA: Diagnosis not present

## 2015-09-01 DIAGNOSIS — Z79899 Other long term (current) drug therapy: Secondary | ICD-10-CM

## 2015-09-01 DIAGNOSIS — M609 Myositis, unspecified: Secondary | ICD-10-CM

## 2015-09-01 DIAGNOSIS — R05 Cough: Secondary | ICD-10-CM | POA: Diagnosis not present

## 2015-09-01 DIAGNOSIS — I10 Essential (primary) hypertension: Secondary | ICD-10-CM | POA: Diagnosis not present

## 2015-09-01 DIAGNOSIS — F329 Major depressive disorder, single episode, unspecified: Secondary | ICD-10-CM

## 2015-09-01 DIAGNOSIS — G43011 Migraine without aura, intractable, with status migrainosus: Secondary | ICD-10-CM | POA: Diagnosis not present

## 2015-09-01 DIAGNOSIS — R5383 Other fatigue: Secondary | ICD-10-CM | POA: Diagnosis not present

## 2015-09-01 DIAGNOSIS — D72819 Decreased white blood cell count, unspecified: Secondary | ICD-10-CM

## 2015-09-01 DIAGNOSIS — R059 Cough, unspecified: Secondary | ICD-10-CM

## 2015-09-01 DIAGNOSIS — IMO0001 Reserved for inherently not codable concepts without codable children: Secondary | ICD-10-CM

## 2015-09-01 DIAGNOSIS — F32A Depression, unspecified: Secondary | ICD-10-CM

## 2015-09-01 MED ORDER — DULOXETINE HCL 60 MG PO CPEP: 60.0000 mg | ORAL_CAPSULE | Freq: Every day | ORAL | Status: DC

## 2015-09-01 MED ORDER — AMLODIPINE BESYLATE 5 MG PO TABS: 5.0000 mg | ORAL_TABLET | Freq: Every day | ORAL | Status: DC

## 2015-09-01 MED ORDER — ERGOCALCIFEROL 1.25 MG (50000 UT) PO CAPS: 50000.0000 [IU] | ORAL_CAPSULE | ORAL | Status: DC

## 2015-09-01 MED ORDER — AMITRIPTYLINE HCL 50 MG PO TABS: 50.0000 mg | ORAL_TABLET | Freq: Every day | ORAL | Status: DC

## 2015-09-01 MED ORDER — BUTALBITAL-APAP-CAFFEINE 50-325-40 MG PO TABS: 1.0000 | ORAL_TABLET | Freq: Four times a day (QID) | ORAL | Status: DC | PRN

## 2015-09-01 NOTE — Patient Instructions (Signed)
Stop your lisinopril. Check your blood pressure at home once a day to every other day.  Keep a jug of miralax juice in your fridge (label well). DRINK MORE WATER (to help flush the toxin out of your system).  Increase the cymbalta and increase the amitriptyline.  Call me next week - once we review labs and see how your blood pressure are and what your cough and muscle aches are doing off of the lisinopril, we will come up with the next plan but will probably need to get you back in for more blood work and a CXR in the next month.  Muscle Pain, Adult Muscle pain (myalgia) may be caused by many things, including:  Overuse or muscle strain, especially if you are not in shape. This is the most common cause of muscle pain.  Injury.  Bruises.  Viruses, such as the flu.  Infectious diseases.  Fibromyalgia, which is a chronic condition that causes muscle tenderness, fatigue, and headache.  Autoimmune diseases, including lupus.  Certain drugs, including ACE inhibitors and statins. Muscle pain may be mild or severe. In most cases, the pain lasts only a short time and goes away without treatment. To diagnose the cause of your muscle pain, your health care provider will take your medical history. This means he or she will ask you when your muscle pain began and what has been happening. If you have not had muscle pain for very long, your health care provider may want to wait before doing much testing. If your muscle pain has lasted a long time, your health care provider may want to run tests right away. If your health care provider thinks your muscle pain may be caused by illness, you may need to have additional tests to rule out certain conditions.  Treatment for muscle pain depends on the cause. Home care is often enough to relieve muscle pain. Your health care provider may also prescribe anti-inflammatory medicine. HOME CARE INSTRUCTIONS Watch your condition for any changes. The following actions  may help to lessen any discomfort you are feeling:  Only take over-the-counter or prescription medicines as directed by your health care provider.  Apply ice to the sore muscle:  Put ice in a plastic bag.  Place a towel between your skin and the bag.  Leave the ice on for 15-20 minutes, 3-4 times a day.  You may alternate applying hot and cold packs to the muscle as directed by your health care provider.  If overuse is causing your muscle pain, slow down your activities until the pain goes away.  Remember that it is normal to feel some muscle pain after starting a workout program. Muscles that have not been used often will be sore at first.  Do regular, gentle exercises if you are not usually active.  Warm up before exercising to lower your risk of muscle pain.  Do not continue working out if the pain is very bad. Bad pain could mean you have injured a muscle. SEEK MEDICAL CARE IF:  Your muscle pain gets worse, and medicines do not help.  You have muscle pain that lasts longer than 3 days.  You have a rash or fever along with muscle pain.  You have muscle pain after a tick bite.  You have muscle pain while working out, even though you are in good physical condition.  You have redness, soreness, or swelling along with muscle pain.  You have muscle pain after starting a new medicine or changing the dose  of a medicine. SEEK IMMEDIATE MEDICAL CARE IF:  You have trouble breathing.  You have trouble swallowing.  You have muscle pain along with a stiff neck, fever, and vomiting.  You have severe muscle weakness or cannot move part of your body. MAKE SURE YOU:   Understand these instructions.  Will watch your condition.  Will get help right away if you are not doing well or get worse.   This information is not intended to replace advice given to you by your health care provider. Make sure you discuss any questions you have with your health care provider.   Document  Released: 08/15/2006 Document Revised: 10/14/2014 Document Reviewed: 07/20/2013 Elsevier Interactive Patient Education 2016 Elsevier Inc. Chronic Fatigue Syndrome Chronic fatigue syndrome (CFS) is a condition in which there is lasting, extreme tiredness (fatigue) that does not improve with rest. CFS affects women up to four times more often than men. If you have CFS, fatigue and other symptoms can make it hard for you to get through your day. There is no treatment or cure. You will need to work closely with your health care provider to come up with a treatment plan that works for you. CAUSES  No one knows what causes CFS. It may be triggered by a flu-like illness or by mono. Other triggers may include:  An abnormal immune system.  Low blood pressure.  Poor diet.  Physical or emotional stress. SIGNS AND SYMPTOMS The main symptom is fatigue that lasts all day, especially after physical or mental stress. Other common symptoms include:  An extreme loss of energy with no obvious cause.  Muscle or joint soreness.  Severe weakness.  Frequent headaches.  Fever.  Sore throat.  Swollen lymph glands.  Sleep is not refreshing.  Loss of concentration or memory. Less common symptoms may include:  Chills.  Night sweats.  Tingling or numbness.  Blurred vision.  Dizziness.  Sensitivity to noise or odors.  Mood swings.  Anxiety, panic attacks, and depression. Your symptoms may come and go, or you may have them all the time. DIAGNOSIS  There are no tests that can help health care providers diagnose CFS. It may take a long time for you to get a correct diagnosis. Your health care provider may need to do a number of tests to rule out other conditions that could be causing your symptoms. You may be diagnosed with CFS if:  You have fatigue that has lasted for at least six months.  Your fatigue is not relieved by rest.  Your fatigue is not caused by another condition.  Your  fatigue is severe enough to interfere with work and daily activities.  You have at least four common symptoms of CFS. TREATMENT  There is no cure for CFS at this time. The condition affects everyone differently. You will need to work with your health care provider to find the best treatment for your symptoms. Treatment may include:  Improving sleep with a regular bedtime routine.  Avoiding caffeine, alcohol, and tobacco.  Doing light exercise and stretching during the day.  Taking medicine to help you sleep.  Taking over-the-counter medicines to relieve joint or muscle pain.  Learning and practicing relaxation techniques.  Using memory aids or doing brain teasers to improve memory and concentration.  Seeing a mental health professional to evaluate and treat depression, if necessary.  Trying massage therapy, acupuncture, and movement exercises, like yoga or tai chi. [place] Work closely with your health care provider to follow your  treatment plan at home. You may need to make major lifestyle changes. If treatment does not seem to help, get a second opinion. You may get help from many health care providers, including doctors, mental health specialists, physical therapists, and rehabilitation therapists. Having the support of friends and loved ones is also important. SEEK MEDICAL CARE IF:  Your symptoms are not responding to treatment.  You are having strong feelings of anger, guilt, anxiety, or depression.   This information is not intended to replace advice given to you by your health care provider. Make sure you discuss any questions you have with your health care provider.   Document Released: 10/31/2004 Document Revised: 10/14/2014 Document Reviewed: 08/13/2013 Elsevier Interactive Patient Education [employer].

## 2015-09-02 ENCOUNTER — Telehealth: Payer: Self-pay | Admitting: *Deleted

## 2015-09-02 LAB — CK: CK TOTAL: 214 U/L — AB (ref 7–177)

## 2015-09-02 LAB — VITAMIN B12: Vitamin B-12: 521 pg/mL (ref 211–911)

## 2015-09-02 MED ORDER — SERTRALINE HCL 50 MG PO TABS: ORAL_TABLET | ORAL | Status: DC

## 2015-09-02 NOTE — Telephone Encounter (Signed)
She can get the cymbalta 60mg  on-line not through her health insurance ([facility]15/mo or $40/ 3 mo supply) or we can stop it and switch her over to zoloft which may work better for her mood but will not help pain as much.  I have sent the zoloft to her pharmacy - she can just stop the cymbalta and start the zoloft 1/2 tabs po qd x 2 wks, then 1 tab po qd but if she'd prefer to get the cymbalta on line we can do that instead.

## 2015-09-02 NOTE — Telephone Encounter (Signed)
Pharmacy called and the Cymbalta was expensive and pt would like the alternative.

## 2015-09-03 ENCOUNTER — Encounter: Payer: Self-pay | Admitting: Family Medicine

## 2015-09-04 NOTE — Telephone Encounter (Signed)
Left message for pt to call back  °

## 2015-09-05 NOTE — Telephone Encounter (Signed)
Left message on voicemail.

## 2015-10-05 ENCOUNTER — Ambulatory Visit: Payer: Self-pay | Admitting: Family Medicine

## 2015-10-20 ENCOUNTER — Other Ambulatory Visit: Payer: Self-pay | Admitting: Family Medicine

## 2015-10-21 NOTE — Telephone Encounter (Signed)
Ok to call in refill?  

## 2015-10-30 NOTE — Telephone Encounter (Signed)
Called in Rx w/message that pt needs OV for more.

## 2015-11-28 ENCOUNTER — Encounter: Payer: Self-pay | Admitting: Family Medicine

## 2015-11-28 ENCOUNTER — Ambulatory Visit (INDEPENDENT_AMBULATORY_CARE_PROVIDER_SITE_OTHER): Payer: 59

## 2015-11-28 ENCOUNTER — Ambulatory Visit (INDEPENDENT_AMBULATORY_CARE_PROVIDER_SITE_OTHER): Payer: 59 | Admitting: Family Medicine

## 2015-11-28 VITALS — BP 147/87 | HR 63 | Temp 98.1°F | Resp 16 | Ht 64.0 in | Wt 169.0 lb

## 2015-11-28 DIAGNOSIS — M1712 Unilateral primary osteoarthritis, left knee: Secondary | ICD-10-CM

## 2015-11-28 DIAGNOSIS — M179 Osteoarthritis of knee, unspecified: Secondary | ICD-10-CM

## 2015-11-28 DIAGNOSIS — M797 Fibromyalgia: Secondary | ICD-10-CM

## 2015-11-28 MED ORDER — TRIAMCINOLONE ACETONIDE 40 MG/ML IJ SUSP: 40.0000 mg | Freq: Once | INTRAMUSCULAR | Status: DC

## 2015-11-28 MED ORDER — ACETAMINOPHEN-CODEINE #3 300-30 MG PO TABS: 1.0000 | ORAL_TABLET | ORAL | Status: DC | PRN

## 2015-11-28 MED ORDER — CYCLOBENZAPRINE HCL ER 30 MG PO CP24: 30.0000 mg | ORAL_CAPSULE | Freq: Every day | ORAL | Status: DC | PRN

## 2015-11-28 MED ORDER — AMLODIPINE BESYLATE 5 MG PO TABS: 5.0000 mg | ORAL_TABLET | Freq: Every day | ORAL | Status: DC

## 2015-11-28 NOTE — Progress Notes (Signed)
Subjective:    Patient ID: Diane [name], female    DOB: [birth year], 60 y.o.   MRN: [id number] Chief Complaint  Patient presents with  . Follow-up  . Hypertension    HPI  Tried Bayers back and body - hurting all over and left knee is really the worst.  Has stopped coughing now that she if off the lisionpril  HAs have been ok  Mood d/o good  Still with a lot of pain all over.   Was able to work in er yard - mood better but pain worse since switched off of the cymbalta onto zoloft due to cost.   Past Medical History  Diagnosis Date  . Hypertension   . Headache(784.0)    No past surgical history on file. Current Outpatient Prescriptions on File Prior to Visit  Medication Sig Dispense Refill  . amitriptyline (ELAVIL) 50 MG tablet Take 1 tablet (50 mg total) by mouth at bedtime. 90 tablet 3  . butalbital-acetaminophen-caffeine (FIORICET) 50-325-40 MG tablet Take 1-2 tablets by mouth every 6 (six) hours as needed for headache. 40 tablet 1  . ergocalciferol (VITAMIN D2) 50000 UNITS capsule Take 1 capsule (50,000 Units total) by mouth once a week. 12 capsule 1  . promethazine (PHENERGAN) 25 MG tablet Take 1 tablet (25 mg total) by mouth every 8 (eight) hours as needed for nausea or vomiting (migraine headache). 20 tablet 1   No current facility-administered medications on file prior to visit.   Allergies  Allergen Reactions  . Topamax [Topiramate] Hives   Family History  Problem Relation Age of Onset  . Healthy Mother    Social History   Social History  . Marital Status: Single    Spouse Name: [name]  . Number of Children: 4  . Years of Education: 12   Occupational History  .      [employer]   Social History Main Topics  . Smoking status: Former Smoker    Quit date: 10/08/1987  . Smokeless tobacco: None  . Alcohol Use: 0.0 oz/week    0 Standard drinks or equivalent per week     Comment: 1 glass of wine occasionally  . Drug Use: No  . Sexual Activity: Not  Asked   Other Topics Concern  . None   Social History Narrative   Pt lives at home with her husband, daughter, and 2 grandchildren.    She has four children    drinks one cup of coffee daily.      Review of Systems  Constitutional: Positive for fatigue. Negative for fever, chills, activity change, appetite change and unexpected weight change.  Respiratory: Negative for cough, chest tightness and shortness of breath.   Cardiovascular: Positive for chest pain.  Musculoskeletal: Positive for myalgias, back pain, joint swelling and arthralgias. Negative for gait problem.  Skin: Negative for color change and rash.  Neurological: Positive for headaches. Negative for dizziness and light-headedness.  Psychiatric/Behavioral: Positive for sleep disturbance and decreased concentration. Negative for dysphoric mood. The patient is not nervous/anxious.         Objective:  BP 147/87 mmHg  Pulse 63  Temp(Src) 98.1 F (36.7 C)  Resp 16  Ht 5\' 4"  (1.626 m)  Wt 169 lb (76.658 kg)  BMI 28.99 kg/m2  Physical Exam  Constitutional: She is oriented to person, place, and time. She appears well-developed and well-nourished. No distress.  HENT:  Head: Normocephalic and atraumatic.  Right Ear: External ear normal.  Left Ear: External ear normal.  Eyes: Conjunctivae are normal. No scleral icterus.  Neck: Normal range of motion. Neck supple. No thyromegaly present.  Cardiovascular: Normal rate, regular rhythm, normal heart sounds and intact distal pulses.   Pulmonary/Chest: Effort normal and breath sounds normal. No respiratory distress.  Musculoskeletal: She exhibits no edema.       Left knee: She exhibits decreased range of motion and effusion. She exhibits no ecchymosis, no erythema, normal patellar mobility and no bony tenderness. Tenderness found. Medial joint line tenderness noted.  + crepitus in both knees but Lt> Rt  Lymphadenopathy:    She has no cervical adenopathy.  Neurological: She is  alert and oriented to person, place, and time.  Skin: Skin is warm and dry. She is not diaphoretic. No erythema.  Psychiatric: Her speech is normal. Judgment normal. Cognition and memory are normal. She exhibits a depressed mood.   Dg Knee 1-2 Views Left  11/28/2015  CLINICAL DATA:  Left knee pain EXAM: LEFT KNEE - 1-2 VIEW COMPARISON:  None. FINDINGS: Standing views of the knees were obtained. There is loss of medial compartment joint space left-greater-than-right. There is more sclerosis on the left as well involving the medial compartment indicative of degenerative joint disease. No fracture is seen. IMPRESSION: Degenerative joint disease involving the medial compartment bilaterally left-greater-than-right. Electronically Signed   By: Ivar Drape M.D.   On: 11/28/2015 15:44   Risks/benefits of aspiration and injection reviewed and informed consent obtained.  Left knee positioned at 10 deg flexion, cleaned with etoh followed with betadine x 2.  Anesthesia w/ ethyl chloride cold spray. Injected with 40mg  of Kenalog and 4cc of 1% lidocaine using 22g 1 1/2in needle n superior lateral approach to suprapatellar bursawithout complications. Pt tolerated procedure well. No EBL.    Assessment & Plan:   1. Fibromyalgia muscle pain - has had worsening pain since she had to go off of the cymbalta due to $$, had to much hangover sedation with cyclobenzaprine qhs so try longer acting amrix though will not be able to afford after initial coupon.  Had some minimal relief with tramadol so try tylenol #3 for severe exacerbations when not working. Spent >10 min in education and reinfocing behavioral techniques to help sxs such as exercise.  Try to take elavil earlier in the evening.  2. Osteoarthritis of left knee, unspecified osteoarthritis type - tried intraarticular corticosteroid shot for the first time today which pt tolerated well    Orders Placed This Encounter  Procedures  . DG Knee 1-2 Views Left     Standing Status: Future     Number of Occurrences: 1     Standing Expiration Date: 11/27/2016    Order Specific Question:  Reason for Exam (SYMPTOM  OR DIAGNOSIS REQUIRED)    Answer:  suspect OA    Order Specific Question:  Is the patient pregnant?    Answer:  No    Order Specific Question:  Preferred imaging location?    Answer:  External    Meds ordered this encounter  Medications  . cyclobenzaprine (AMRIX) 30 MG 24 hr capsule    Sig: Take 1 capsule (30 mg total) by mouth daily as needed for muscle spasms.    Dispense:  30 capsule    Refill:  1  . triamcinolone acetonide (KENALOG-40) injection 40 mg    Sig:   . acetaminophen-codeine (TYLENOL #3) 300-30 MG tablet    Sig: Take 1 tablet by mouth every 4 (four) hours as needed for moderate pain.  Dispense:  30 tablet    Refill:  0  . amLODipine (NORVASC) 5 MG tablet    Sig: Take 1 tablet (5 mg total) by mouth daily.    Dispense:  90 tablet    Refill:  1     [name], MD MPH

## 2015-11-28 NOTE — Patient Instructions (Signed)
Continue the zoloft and amodipine in the morning. When you get home from work, take the Amrix (cyclobenzaprine) 2 hours later around 4-5 pm take the elavil.  Hopefully this will help put you to sleep by 6-8 pm and

## 2015-11-29 ENCOUNTER — Other Ambulatory Visit: Payer: Self-pay | Admitting: Family Medicine

## 2015-12-01 ENCOUNTER — Telehealth: Payer: Self-pay

## 2015-12-01 NOTE — Telephone Encounter (Signed)
Got fax from [place] stating Amrix needs PA. Pt was given savings card from [facility] that should require no PA because manuf covers med even if ins rejects. I called pharm to give them this info and faxed them another copy of card with the number for the Pharm Help Desk to call if they have problems processing.

## 2015-12-08 ENCOUNTER — Encounter: Payer: Self-pay | Admitting: Family Medicine

## 2016-02-01 ENCOUNTER — Ambulatory Visit (INDEPENDENT_AMBULATORY_CARE_PROVIDER_SITE_OTHER): Payer: 59 | Admitting: Family Medicine

## 2016-02-01 ENCOUNTER — Encounter: Payer: Self-pay | Admitting: Family Medicine

## 2016-02-01 VITALS — BP 131/83 | HR 81 | Temp 98.7°F | Resp 16 | Ht 64.0 in | Wt 167.0 lb

## 2016-02-01 DIAGNOSIS — E559 Vitamin D deficiency, unspecified: Secondary | ICD-10-CM | POA: Diagnosis not present

## 2016-02-01 DIAGNOSIS — I10 Essential (primary) hypertension: Secondary | ICD-10-CM

## 2016-02-01 DIAGNOSIS — M179 Osteoarthritis of knee, unspecified: Secondary | ICD-10-CM | POA: Diagnosis not present

## 2016-02-01 DIAGNOSIS — F329 Major depressive disorder, single episode, unspecified: Secondary | ICD-10-CM | POA: Diagnosis not present

## 2016-02-01 DIAGNOSIS — G43011 Migraine without aura, intractable, with status migrainosus: Secondary | ICD-10-CM

## 2016-02-01 DIAGNOSIS — M791 Myalgia, unspecified site: Secondary | ICD-10-CM

## 2016-02-01 DIAGNOSIS — M1990 Unspecified osteoarthritis, unspecified site: Secondary | ICD-10-CM | POA: Insufficient documentation

## 2016-02-01 DIAGNOSIS — D72819 Decreased white blood cell count, unspecified: Secondary | ICD-10-CM | POA: Diagnosis not present

## 2016-02-01 DIAGNOSIS — Z79899 Other long term (current) drug therapy: Secondary | ICD-10-CM | POA: Diagnosis not present

## 2016-02-01 DIAGNOSIS — M797 Fibromyalgia: Secondary | ICD-10-CM

## 2016-02-01 DIAGNOSIS — F32A Depression, unspecified: Secondary | ICD-10-CM

## 2016-02-01 DIAGNOSIS — M1712 Unilateral primary osteoarthritis, left knee: Secondary | ICD-10-CM

## 2016-02-01 LAB — CBC WITH DIFFERENTIAL/PLATELET
BASOS ABS: 29 {cells}/uL (ref 0–200)
Basophils Relative: 1 %
EOS PCT: 3 %
Eosinophils Absolute: 87 cells/uL (ref 15–500)
HEMATOCRIT: 37.1 % (ref 35.0–45.0)
HEMOGLOBIN: 12.4 g/dL (ref 11.7–15.5)
LYMPHS PCT: 42 %
Lymphs Abs: 1218 cells/uL (ref 850–3900)
MCH: 27.9 pg (ref 27.0–33.0)
MCHC: 33.4 g/dL (ref 32.0–36.0)
MCV: 83.6 fL (ref 80.0–100.0)
MPV: 8.8 fL (ref 7.5–12.5)
Monocytes Absolute: 232 cells/uL (ref 200–950)
Monocytes Relative: 8 %
NEUTROS ABS: 1334 {cells}/uL — AB (ref 1500–7800)
Neutrophils Relative %: 46 %
Platelets: 250 10*3/uL (ref 140–400)
RBC: 4.44 MIL/uL (ref 3.80–5.10)
RDW: 13.6 % (ref 11.0–15.0)
WBC: 2.9 10*3/uL — ABNORMAL LOW (ref 3.8–10.8)

## 2016-02-01 LAB — COMPREHENSIVE METABOLIC PANEL
ALBUMIN: 4.2 g/dL (ref 3.6–5.1)
ALK PHOS: 76 U/L (ref 33–130)
ALT: 11 U/L (ref 6–29)
AST: 25 U/L (ref 10–35)
BUN: 12 mg/dL (ref 7–25)
CALCIUM: 9.2 mg/dL (ref 8.6–10.4)
CHLORIDE: 105 mmol/L (ref 98–110)
CO2: 26 mmol/L (ref 20–31)
Creat: 0.63 mg/dL (ref 0.50–1.05)
Glucose, Bld: 92 mg/dL (ref 65–99)
POTASSIUM: 3.7 mmol/L (ref 3.5–5.3)
Sodium: 139 mmol/L (ref 135–146)
TOTAL PROTEIN: 6.5 g/dL (ref 6.1–8.1)
Total Bilirubin: 0.3 mg/dL (ref 0.2–1.2)

## 2016-02-01 LAB — CK: CK TOTAL: 179 U/L — AB (ref 7–177)

## 2016-02-01 MED ORDER — PROMETHAZINE HCL 25 MG PO TABS: 25.0000 mg | ORAL_TABLET | Freq: Three times a day (TID) | ORAL | Status: DC | PRN

## 2016-02-01 MED ORDER — CYCLOBENZAPRINE HCL ER 30 MG PO CP24: 30.0000 mg | ORAL_CAPSULE | Freq: Every day | ORAL | Status: DC | PRN

## 2016-02-01 MED ORDER — BUTALBITAL-APAP-CAFFEINE 50-325-40 MG PO TABS: 1.0000 | ORAL_TABLET | Freq: Four times a day (QID) | ORAL | Status: DC | PRN

## 2016-02-01 MED ORDER — ACETAMINOPHEN-CODEINE #3 300-30 MG PO TABS: 1.0000 | ORAL_TABLET | ORAL | Status: DC | PRN

## 2016-02-01 NOTE — Progress Notes (Addendum)
Subjective:    Patient ID: Diane [name], female    DOB: 08/11/1956, 60 y.o.   MRN: [id number] Chief Complaint  Patient presents with  . Follow-up  . Hypertension    HPI  Diane [name] is a 60 yo woman here today for a 2 month follow-up on her chronic medical problems.  About 1 year prior, pt was diagnosed with fibromyalgia and depression. She also has a h/o migraines.  FM: diffuse whole body myalgias/arthralgias and more when depressed; did well on cymbalta but pt couldn't afford even though generic. Cyclobenzaprine qhs caused to much sedation so at last visit she was able to try amrix though can't afford to cont.  On amitryptiline 50 qhs. Tramadol did help minimally so started pt on trial of tylenol #3 for severe pain flairs - takes 1 a day at most, sometimes every other day.  5 mos prior labs did show a mildly elevated cpk but suspect this is just her baseline;. Amrix helped substantioally  OA: failed Bayers back and body; left knee worst so at her last visit 2 mos prev we did do a cortisone injection to L knee. It really improved a lot  Migraines: prn fioricet; allergic to topamax; followed by Dr. Leta [name] at [facility] - stable  HTN: Had to stop lisinopril due to cough; on amlodipine 5   Depression:  On zoloft for sev mos after she had to stop cymbalta due to cost. Enjoys working in the yards though this does exacerbate her FM.   Vitamin D deficiency: 80 1 yr prior - not taking any vitamin D, doesn't remember when she went off the high dose  Depression screen [facility] 2/9 02/12/2016 02/01/2016 11/28/2015 09/01/2015 06/05/2015  Decreased Interest 0 0 0 0 0  Down, Depressed, Hopeless 0 0 0 0 0  PHQ - 2 Score 0 0 0 0 0   Past Medical History  Diagnosis Date  . Hypertension   . Headache(784.0)    No past surgical history on file. Current Outpatient Prescriptions on File Prior to Visit  Medication Sig Dispense Refill  . amitriptyline (ELAVIL) 50 MG tablet Take 1 tablet (50 mg total) by  mouth at bedtime. 90 tablet 3  . amLODipine (NORVASC) 5 MG tablet Take 1 tablet (5 mg total) by mouth daily. 90 tablet 1  . ergocalciferol (VITAMIN D2) 50000 UNITS capsule Take 1 capsule (50,000 Units total) by mouth once a week. 12 capsule 1   No current facility-administered medications on file prior to visit.   Allergies  Allergen Reactions  . Topamax [Topiramate] Hives   Family History  Problem Relation Age of Onset  . Healthy Mother    Social History   Social History  . Marital Status: Single    Spouse Name: [name]  . Number of Children: 4  . Years of Education: 12   Occupational History  .      [employer]   Social History Main Topics  . Smoking status: Former Smoker    Quit date: 10/08/1987  . Smokeless tobacco: None  . Alcohol Use: 0.0 oz/week    0 Standard drinks or equivalent per week     Comment: 1 glass of wine occasionally  . Drug Use: No  . Sexual Activity: Not Asked   Other Topics Concern  . None   Social History Narrative   Pt lives at home with her husband, daughter, and 2 grandchildren.    She has four children    Husband needs high-level of her  care due to h/o brain tumor   Drives a city bus   [place] working outside in her yard and gardening.   drinks one cup of coffee daily.      Review of Systems  Constitutional: Positive for activity change and fatigue. Negative for fever, chills, appetite change and unexpected weight change.  Respiratory: Negative for cough and chest tightness.   Cardiovascular: Negative for chest pain, palpitations and leg swelling.  Musculoskeletal: Positive for myalgias, back pain, arthralgias and neck pain. Negative for joint swelling and gait problem.  Skin: Negative for color change and rash.  Allergic/Immunologic: Negative for immunocompromised state.  Neurological: Positive for headaches. Negative for weakness and numbness.  Psychiatric/Behavioral: Negative for behavioral problems, confusion, sleep disturbance and  dysphoric mood. The patient is not nervous/anxious.        Objective:  BP 131/83 mmHg  Pulse 81  Temp(Src) 98.7 F (37.1 C)  Resp 16  Ht 5\' 4"  (1.626 m)  Wt 167 lb (75.751 kg)  BMI 28.65 kg/m2  Physical Exam  Constitutional: She is oriented to person, place, and time. She appears well-developed and well-nourished. No distress.  HENT:  Head: Normocephalic and atraumatic.  Right Ear: External ear normal.  Left Ear: External ear normal.  Eyes: Conjunctivae are normal. No scleral icterus.  Neck: Normal range of motion. Neck supple. No thyromegaly present.  Cardiovascular: Normal rate, regular rhythm, normal heart sounds and intact distal pulses.   Pulmonary/Chest: Effort normal and breath sounds normal. No respiratory distress.  Musculoskeletal: She exhibits no edema.  Lymphadenopathy:    She has no cervical adenopathy.  Neurological: She is alert and oriented to person, place, and time.  Skin: Skin is warm and dry. She is not diaphoretic. No erythema.  Psychiatric: She has a normal mood and affect. Her behavior is normal.          Assessment & Plan:  Has not had lipid panel; has not had a CPE prior in Epic 1. Essential hypertension   2. Depression   3. Leukopenia   4. Polypharmacy   5. Intractable migraine without aura and with status migrainosus   6. Fibromyalgia   7. Osteoarthritis of left knee, unspecified osteoarthritis type   8. Vitamin D deficiency - unchanged from last yr - repeat high dose, then start otc supp.  9. Myalgia     Orders Placed This Encounter  Procedures  . CBC with Differential/Platelet  . VITAMIN D 25 Hydroxy (Vit-D Deficiency, Fractures)  . Comprehensive metabolic panel  . CK    Meds ordered this encounter  Medications  . cyclobenzaprine (AMRIX) 30 MG 24 hr capsule    Sig: Take 1 capsule (30 mg total) by mouth daily as needed for muscle spasms.    Dispense:  30 capsule    Refill:  3  . promethazine (PHENERGAN) 25 MG tablet    Sig:  Take 1 tablet (25 mg total) by mouth every 8 (eight) hours as needed for nausea or vomiting (migraine headache).    Dispense:  80 tablet    Refill:  0  . butalbital-acetaminophen-caffeine (FIORICET) 50-325-40 MG tablet    Sig: Take 1-2 tablets by mouth every 6 (six) hours as needed for headache.    Dispense:  80 tablet    Refill:  0  . acetaminophen-codeine (TYLENOL #3) 300-30 MG tablet    Sig: Take 1 tablet by mouth every 4 (four) hours as needed for moderate pain.    Dispense:  120 tablet    Refill:  0     [name], MD MPH

## 2016-02-01 NOTE — Patient Instructions (Signed)
     IF you received an x-ray today, you will receive an invoice from [facility]. Please contact [facility] at [phone] with questions or concerns regarding your invoice.   IF you received labwork today, you will receive an invoice from [facility]. Please contact [facility] at [phone] with questions or concerns regarding your invoice.   Our billing staff will not be able to assist you with questions regarding bills from these companies.  You will be contacted with the lab results as soon as they are available. The fastest way to get your results is to activate your My Chart account. Instructions are located on the last page of this paperwork. If you have not heard from us regarding the results in 2 weeks, please contact this office.      

## 2016-02-02 LAB — VITAMIN D 25 HYDROXY (VIT D DEFICIENCY, FRACTURES): VIT D 25 HYDROXY: 17 ng/mL — AB (ref 30–100)

## 2016-02-12 ENCOUNTER — Ambulatory Visit (INDEPENDENT_AMBULATORY_CARE_PROVIDER_SITE_OTHER): Payer: 59 | Admitting: Emergency Medicine

## 2016-02-12 ENCOUNTER — Ambulatory Visit (INDEPENDENT_AMBULATORY_CARE_PROVIDER_SITE_OTHER): Payer: 59

## 2016-02-12 VITALS — BP 122/84 | HR 88 | Temp 97.9°F | Resp 18 | Ht 64.0 in | Wt 157.0 lb

## 2016-02-12 DIAGNOSIS — M5441 Lumbago with sciatica, right side: Secondary | ICD-10-CM | POA: Diagnosis not present

## 2016-02-12 MED ORDER — HYDROCODONE-ACETAMINOPHEN 5-325 MG PO TABS: 1.0000 | ORAL_TABLET | Freq: Four times a day (QID) | ORAL | Status: DC | PRN

## 2016-02-12 MED ORDER — MELOXICAM 15 MG PO TABS: 15.0000 mg | ORAL_TABLET | Freq: Every day | ORAL | Status: DC

## 2016-02-12 MED ORDER — KETOROLAC TROMETHAMINE 30 MG/ML IJ SOLN
30.0000 mg | Freq: Once | INTRAMUSCULAR | Status: AC
  Administered 2016-02-12: 30 mg via INTRAMUSCULAR

## 2016-02-12 NOTE — Patient Instructions (Addendum)
IF you received an x-ray today, you will receive an invoice from [facility]. Please contact [facility] at [phone] with questions or concerns regarding your invoice.   IF you received labwork today, you will receive an invoice from [facility]. Please contact [facility] at [phone] with questions or concerns regarding your invoice.   Our billing staff will not be able to assist you with questions regarding bills from these companies.  You will be contacted with the lab results as soon as they are available. The fastest way to get your results is to activate your My Chart account. Instructions are located on the last page of this paperwork. If you have not heard from [place] regarding the results in 2 weeks, please contact this office.   Back Pain, Adult Back pain is very common in adults.The cause of back pain is rarely dangerous and the pain often gets better over time.The cause of your back pain may not be known. Some common causes of back pain include:  Strain of the muscles or ligaments supporting the spine.  Wear and tear (degeneration) of the spinal disks.  Arthritis.  Direct injury to the back. For many people, back pain may return. Since back pain is rarely dangerous, most people can learn to manage this condition on their own. HOME CARE INSTRUCTIONS Watch your back pain for any changes. The following actions may help to lessen any discomfort you are feeling:  Remain active. It is stressful on your back to sit or stand in one place for long periods of time. Do not sit, drive, or stand in one place for more than 30 minutes at a time. Take short walks on even surfaces as soon as you are able.Try to increase the length of time you walk each day.  Exercise regularly as directed by your health care provider. Exercise helps your back heal faster. It also helps avoid future injury by keeping your muscles strong and flexible.  Do not  stay in bed.Resting more than 1-2 days can delay your recovery.  Pay attention to your body when you bend and lift. The most comfortable positions are those that put less stress on your recovering back. Always use proper lifting techniques, including:  Bending your knees.  Keeping the load close to your body.  Avoiding twisting.  Find a comfortable position to sleep. Use a firm mattress and lie on your side with your knees slightly bent. If you lie on your back, put a pillow under your knees.  Avoid feeling anxious or stressed.Stress increases muscle tension and can worsen back pain.It is important to recognize when you are anxious or stressed and learn ways to manage it, such as with exercise.  Take medicines only as directed by your health care provider. Over-the-counter medicines to reduce pain and inflammation are often the most helpful.Your health care provider may prescribe muscle relaxant drugs.These medicines help dull your pain so you can more quickly return to your normal activities and healthy exercise.  Apply ice to the injured area:  Put ice in a plastic bag.  Place a towel between your skin and the bag.  Leave the ice on for 20 minutes, 2-3 times a day for the first 2-3 days. After that, ice and heat may be alternated to reduce pain and spasms.  Maintain a healthy weight. Excess weight puts extra stress on your back and makes it difficult to maintain good posture. SEEK MEDICAL CARE IF:  You have pain that  is not relieved with rest or medicine.  You have increasing pain going down into the legs or buttocks.  You have pain that does not improve in one week.  You have night pain.  You lose weight.  You have a fever or chills. SEEK IMMEDIATE MEDICAL CARE IF:   You develop new bowel or bladder control problems.  You have unusual weakness or numbness in your arms or legs.  You develop nausea or vomiting.  You develop abdominal pain.  You feel faint.    This information is not intended to replace advice given to you by your health care provider. Make sure you discuss any questions you have with your health care provider.   Document Released: 09/23/2005 Document Revised: 10/14/2014 Document Reviewed: 01/25/2014 Elsevier Interactive Patient Education [employer].

## 2016-02-12 NOTE — Progress Notes (Signed)
Subjective:  This chart was scribed for Diane Queen MD, by Tamsen Roers, at [facility].  This patient was seen in room 10 and the patient's care was started at 10:12 AM.    Patient ID: Diane [name], female    DOB: [birth year], 60 y.o.   MRN: [id number]  HPI  HPI Comments: Diane [name] is a 60 y.o. female who presents to the [facility] complaining of immediate radiating lower back pain (to her right hip) onset last night which started when she was getting out of her bed. She tried a hot heading pad but denies any relief. She took tylenol 3 this morning but does not feel that it helped her.  She has had back pain in the past but states that this time, the pain will not "let up".  She denies any recent injury and her last activity was mowing the lawn two days ago.  Patient works in [employer] and occasionally has sharp pains in her back which cause her knees to buckle.  She has had shots in her back a long time ago but is unable to recall the physician who gave them to her. She has gone through menopause.    Patient Active Problem List   Diagnosis Date Noted   Fibromyalgia 02/01/2016   Arthritis, senescent 02/01/2016   Vitamin D deficiency 02/01/2016   Depression 08/31/2015   Leukopenia 08/31/2015   Polypharmacy 08/31/2015   Migraine headache without aura 12/31/2012   Hypertension 12/31/2012   Insomnia 12/31/2012   Past Medical History  Diagnosis Date   Hypertension    Headache(784.0)    History reviewed. No pertinent past surgical history. Allergies  Allergen Reactions   Topamax [Topiramate] Hives   Prior to Admission medications   Medication Sig Start Date End Date Taking? Authorizing Provider  acetaminophen-codeine (TYLENOL #3) 300-30 MG tablet Take 1 tablet by mouth every 4 (four) hours as needed for moderate pain. 02/01/16  Yes Shawnee Knapp, MD  amitriptyline (ELAVIL) 50 MG tablet Take 1 tablet (50 mg total)  by mouth at bedtime. 09/01/15  Yes Shawnee Knapp, MD  amLODipine (NORVASC) 5 MG tablet Take 1 tablet (5 mg total) by mouth daily. 11/28/15  Yes Shawnee Knapp, MD  butalbital-acetaminophen-caffeine (FIORICET) [phone] MG tablet Take 1-2 tablets by mouth every 6 (six) hours as needed for headache. 02/01/16 01/31/17 Yes Shawnee Knapp, MD  cyclobenzaprine (AMRIX) 30 MG 24 hr capsule Take 1 capsule (30 mg total) by mouth daily as needed for muscle spasms. 02/01/16  Yes Shawnee Knapp, MD  ergocalciferol (VITAMIN D2) 50000 UNITS capsule Take 1 capsule (50,000 Units total) by mouth once a week. 09/01/15  Yes Shawnee Knapp, MD  promethazine (PHENERGAN) 25 MG tablet Take 1 tablet (25 mg total) by mouth every 8 (eight) hours as needed for nausea or vomiting (migraine headache). 02/01/16  Yes Shawnee Knapp, MD   Social History   Social History   Marital Status: Single    Spouse Name: Diane [name]   Number of Children: 4   Years of Education: 12   Occupational History        [employer]   Social History Main Topics   Smoking status: Former Smoker    Quit date: 10/08/1987   Smokeless tobacco: Not on file   Alcohol Use: 0.0 oz/week    0 Standard drinks or equivalent per week     Comment: 1 glass of wine occasionally  Drug Use: No   Sexual Activity: Not on file   Other Topics Concern   Not on file   Social History Narrative   Pt lives at home with her husband, daughter, and 2 grandchildren.    She has four children    Husband needs high-level of her care due to h/o brain tumor   Drives a city bus   [place] working outside in her yard and gardening.   drinks one cup of coffee daily.      Review of Systems  Constitutional: Negative for fever and chills.  Eyes: Negative for pain, redness and itching.  Respiratory: Negative for cough, choking and shortness of breath.   Gastrointestinal: Negative for nausea and vomiting.  Musculoskeletal: Positive for back pain and gait problem. Negative for neck pain and neck  stiffness.  Skin: Negative for color change.       Objective:   Physical Exam Filed Vitals:   02/12/16 0936  BP: 122/84  Pulse: 88  Temp: 97.9 F (36.6 C)  TempSrc: Oral  Resp: 18  Height: 5\' 4"  (1.626 m)  Weight: 157 lb (71.215 kg)  SpO2: 95%   CONSTITUTIONAL: Appears to be in distress complaining of the pain in her back.  HEAD: Normocephalic/atraumatic EYES: EOMI/PERRL SPINE/BACK: tenderness at lower lumbar spine in the right SI join, straight leg raise positive on the right at about 70 degrees with patient seated CV: S1/S2 noted, no murmurs/rubs/gallops noted LUNGS: Lungs are clear to auscultation bilaterally, no apparent distress NEURO: Pt is awake/alert/appropriate, moves all extremitiesx4.  No facial droop.  reflexes are 1+ and symmetrical.   EXTREMITIES: pulses normal/equal, full ROM SKIN: warm, color normal PSYCH: no abnormalities of mood noted, alert and oriented to situation  Dg Lumbar Spine Complete  02/12/2016  CLINICAL DATA:  RIGHT-sided low back pain with RIGHT-sided sciatica EXAM: LUMBAR SPINE - COMPLETE 4+ VIEW COMPARISON:  MRI lumbar spine 03/15/2011 FINDINGS: Six non-rib-bearing lumbar type segments which would indicate a lumbarized S1 segment. Prior MR however is labeled with last independent vertebra as L5 and the current exam is labeled similarly to prevent confusion. If patient is to have any surgical intervention in the spine, clinical correlation of this numbering system should be performed. Osseous demineralization. Disc space narrowing with endplate spur formation at L4-L5. Minimal disc space narrowing L3-L4. Vertebral body and disc space heights otherwise maintained. No acute fracture, subluxation or bone destruction. Suspected bony narrowing of the neural foramina at L4-L5. No obvious spondylolysis. SI joints symmetric. Spina bifida occulta of the first non-rib-bearing segment in the lumbar region. IMPRESSION: Six non-rib-bearing lumbar type segments with  mild vacation of the lumbar numbering system to matches the prior MR exam, see discussion above. Degenerative disc disease changes lower lumbar spine without acute bony abnormalities. Electronically Signed   By: Lavonia Dana M.D.   On: 02/12/2016 10:50        Assessment & Plan:    Darlyne Russian, MD

## 2016-02-12 NOTE — Progress Notes (Addendum)
Subjective:  This chart was scribed for [name], by Tamsen Roers, at [facility].  This patient was seen in room 10 and the patient's care was started at 10:12 AM.    Patient ID: Diane [name], female    DOB: 05/23/1956, 60 y.o.   MRN: [id number]  Back Pain Pertinent negatives include no fever.    HPI Comments: Diane [name] is a 60 y.o. female who presents to the [facility] complaining of immediate radiating lower back pain (to her right hip) onset last night which started when she was getting out of her bed. She tried a hot heading pad but denies any relief. She took tylenol 3 this morning but does not feel that it helped her.  She has had back pain in the past but states that this time, the pain will not "let up".  She denies any recent injury and her last activity was mowing the lawn two days ago.  Patient works in [employer] and occasionally has sharp pains in her back which cause her knees to buckle.  She has had shots in her back a long time ago but is unable to recall the physician who gave them to her. She has gone through menopause.    Patient Active Problem List   Diagnosis Date Noted  . Fibromyalgia 02/01/2016  . Arthritis, senescent 02/01/2016  . Vitamin D deficiency 02/01/2016  . Depression 08/31/2015  . Leukopenia 08/31/2015  . Polypharmacy 08/31/2015  . Migraine headache without aura 12/31/2012  . Hypertension 12/31/2012  . Insomnia 12/31/2012   Past Medical History  Diagnosis Date  . Hypertension   . Headache(784.0)    History reviewed. No pertinent past surgical history. Allergies  Allergen Reactions  . Topamax [Topiramate] Hives   Prior to Admission medications   Medication Sig Start Date End Date Taking? Authorizing Provider  acetaminophen-codeine (TYLENOL #3) 300-30 MG tablet Take 1 tablet by mouth every 4 (four) hours as needed for moderate pain. 02/01/16  Yes Shawnee Knapp, MD  amitriptyline  (ELAVIL) 50 MG tablet Take 1 tablet (50 mg total) by mouth at bedtime. 09/01/15  Yes Shawnee Knapp, MD  amLODipine (NORVASC) 5 MG tablet Take 1 tablet (5 mg total) by mouth daily. 11/28/15  Yes Shawnee Knapp, MD  butalbital-acetaminophen-caffeine (FIORICET) [phone] MG tablet Take 1-2 tablets by mouth every 6 (six) hours as needed for headache. 02/01/16 01/31/17 Yes Shawnee Knapp, MD  cyclobenzaprine (AMRIX) 30 MG 24 hr capsule Take 1 capsule (30 mg total) by mouth daily as needed for muscle spasms. 02/01/16  Yes Shawnee Knapp, MD  ergocalciferol (VITAMIN D2) 50000 UNITS capsule Take 1 capsule (50,000 Units total) by mouth once a week. 09/01/15  Yes Shawnee Knapp, MD  promethazine (PHENERGAN) 25 MG tablet Take 1 tablet (25 mg total) by mouth every 8 (eight) hours as needed for nausea or vomiting (migraine headache). 02/01/16  Yes Shawnee Knapp, MD   Social History   Social History  . Marital Status: Single    Spouse Name: [name]  . Number of Children: 4  . Years of Education: 12   Occupational History  .      [employer]   Social History Main Topics  . Smoking status: Former Smoker    Quit date: 10/08/1987  . Smokeless tobacco: Not on file  . Alcohol Use: 0.0 oz/week    0 Standard drinks or equivalent per week  Comment: 1 glass of wine occasionally  . Drug Use: No  . Sexual Activity: Not on file   Other Topics Concern  . Not on file   Social History Narrative   Pt lives at home with her husband, daughter, and 2 grandchildren.    She has four children    Husband needs high-level of her care due to h/o brain tumor   Drives a city bus   [place] working outside in her yard and gardening.   drinks one cup of coffee daily.      Review of Systems  Constitutional: Negative for fever and chills.  Eyes: Negative for pain, redness and itching.  Respiratory: Negative for cough, choking and shortness of breath.   Gastrointestinal: Negative for nausea and vomiting.  Musculoskeletal: Positive for back pain and  gait problem. Negative for neck pain and neck stiffness.  Skin: Negative for color change.       Objective:   Physical Exam Filed Vitals:   02/12/16 0936  BP: 122/84  Pulse: 88  Temp: 97.9 F (36.6 C)  TempSrc: Oral  Resp: 18  Height: 5\' 4"  (1.626 m)  Weight: 157 lb (71.215 kg)  SpO2: 95%   CONSTITUTIONAL: Appears to be in distress complaining of the pain in her back.  HEAD: Normocephalic/atraumatic EYES: EOMI/PERRL SPINE/BACK: tenderness at lower lumbar spine in the right SI join, straight leg raise positive on the right at about 70 degrees with patient seated CV: S1/S2 noted, no murmurs/rubs/gallops noted LUNGS: Lungs are clear to auscultation bilaterally, no apparent distress NEURO: Pt is awake/alert/appropriate, moves all extremitiesx4.  No facial droop.  reflexes are 1+ and symmetrical.   EXTREMITIES: pulses normal/equal, full ROM SKIN: warm, color normal PSYCH: no abnormalities of mood noted, alert and oriented to situation  Dg Lumbar Spine Complete  02/12/2016  CLINICAL DATA:  RIGHT-sided low back pain with RIGHT-sided sciatica EXAM: LUMBAR SPINE - COMPLETE 4+ VIEW COMPARISON:  MRI lumbar spine 03/15/2011 FINDINGS: Six non-rib-bearing lumbar type segments which would indicate a lumbarized S1 segment. Prior MR however is labeled with last independent vertebra as L5 and the current exam is labeled similarly to prevent confusion. If patient is to have any surgical intervention in the spine, clinical correlation of this numbering system should be performed. Osseous demineralization. Disc space narrowing with endplate spur formation at L4-L5. Minimal disc space narrowing L3-L4. Vertebral body and disc space heights otherwise maintained. No acute fracture, subluxation or bone destruction. Suspected bony narrowing of the neural foramina at L4-L5. No obvious spondylolysis. SI joints symmetric. Spina bifida occulta of the first non-rib-bearing segment in the lumbar region. IMPRESSION: Six  non-rib-bearing lumbar type segments with mild vacation of the lumbar numbering system to matches the prior MR exam, see discussion above. Degenerative disc disease changes lower lumbar spine without acute bony abnormalities. Electronically Signed   By: Lavonia Dana M.D.   On: 02/12/2016 10:50  Meds ordered this encounter  Medications  . ketorolac (TORADOL) 30 MG/ML injection 30 mg    Sig:   . meloxicam (MOBIC) 15 MG tablet    Sig: Take 1 tablet (15 mg total) by mouth daily.    Dispense:  30 tablet    Refill:  1  . HYDROcodone-acetaminophen (NORCO) 5-325 MG tablet    Sig: Take 1 tablet by mouth every 6 (six) hours as needed for moderate pain.    Dispense:  12 tablet    Refill:  0        Assessment & Plan:  Patient feels some better after the shot of Toradol. She will be on Mobic one a day Ultram for pain and Flexeril at night. Referral made to the back specialist because she has had significant problems with her back in 2012 which required injections for degenerative disc disease.I personally performed the services described in this documentation, which was scribed in my presence. The recorded information has been reviewed and is accurate.Referral made to orthopedics. She was given a note for today and tomorrow.I personally performed the services described in this documentation, which was scribed in my presence. The recorded information has been reviewed and is accurate.  Darlyne Russian, MD

## 2016-02-19 ENCOUNTER — Encounter: Payer: Self-pay | Admitting: Family Medicine

## 2016-02-19 MED ORDER — ERGOCALCIFEROL 1.25 MG (50000 UT) PO CAPS: 50000.0000 [IU] | ORAL_CAPSULE | ORAL | Status: DC

## 2016-02-19 NOTE — Addendum Note (Signed)
Addended by: [name] on: 02/19/2016 10:05 AM   Modules accepted: Orders, SmartSet

## 2016-05-08 ENCOUNTER — Encounter: Payer: Self-pay | Admitting: Family Medicine

## 2016-05-08 ENCOUNTER — Ambulatory Visit (INDEPENDENT_AMBULATORY_CARE_PROVIDER_SITE_OTHER): Payer: BLUE CROSS/BLUE SHIELD | Admitting: Family Medicine

## 2016-05-08 VITALS — BP 122/86 | HR 74 | Temp 98.3°F | Resp 17 | Ht 64.5 in | Wt 163.0 lb

## 2016-05-08 DIAGNOSIS — Z23 Encounter for immunization: Secondary | ICD-10-CM

## 2016-05-08 DIAGNOSIS — Z113 Encounter for screening for infections with a predominantly sexual mode of transmission: Secondary | ICD-10-CM | POA: Diagnosis not present

## 2016-05-08 DIAGNOSIS — Z1389 Encounter for screening for other disorder: Secondary | ICD-10-CM

## 2016-05-08 DIAGNOSIS — Z124 Encounter for screening for malignant neoplasm of cervix: Secondary | ICD-10-CM | POA: Diagnosis not present

## 2016-05-08 DIAGNOSIS — D72819 Decreased white blood cell count, unspecified: Secondary | ICD-10-CM | POA: Diagnosis not present

## 2016-05-08 DIAGNOSIS — Z0000 Encounter for general adult medical examination without abnormal findings: Secondary | ICD-10-CM | POA: Diagnosis not present

## 2016-05-08 DIAGNOSIS — F32A Depression, unspecified: Secondary | ICD-10-CM

## 2016-05-08 DIAGNOSIS — Z136 Encounter for screening for cardiovascular disorders: Secondary | ICD-10-CM

## 2016-05-08 DIAGNOSIS — Z130 Encounter for screening for diseases of the blood and blood-forming organs and certain disorders involving the immune mechanism: Secondary | ICD-10-CM | POA: Diagnosis not present

## 2016-05-08 DIAGNOSIS — Z1211 Encounter for screening for malignant neoplasm of colon: Secondary | ICD-10-CM | POA: Diagnosis not present

## 2016-05-08 DIAGNOSIS — M5441 Lumbago with sciatica, right side: Secondary | ICD-10-CM

## 2016-05-08 DIAGNOSIS — Z1239 Encounter for other screening for malignant neoplasm of breast: Secondary | ICD-10-CM

## 2016-05-08 DIAGNOSIS — G4700 Insomnia, unspecified: Secondary | ICD-10-CM

## 2016-05-08 DIAGNOSIS — Z1383 Encounter for screening for respiratory disorder NEC: Secondary | ICD-10-CM

## 2016-05-08 DIAGNOSIS — M797 Fibromyalgia: Secondary | ICD-10-CM | POA: Diagnosis not present

## 2016-05-08 DIAGNOSIS — Z79899 Other long term (current) drug therapy: Secondary | ICD-10-CM

## 2016-05-08 DIAGNOSIS — E559 Vitamin D deficiency, unspecified: Secondary | ICD-10-CM

## 2016-05-08 DIAGNOSIS — F329 Major depressive disorder, single episode, unspecified: Secondary | ICD-10-CM

## 2016-05-08 DIAGNOSIS — Z1329 Encounter for screening for other suspected endocrine disorder: Secondary | ICD-10-CM

## 2016-05-08 DIAGNOSIS — I10 Essential (primary) hypertension: Secondary | ICD-10-CM

## 2016-05-08 DIAGNOSIS — Z1212 Encounter for screening for malignant neoplasm of rectum: Secondary | ICD-10-CM

## 2016-05-08 LAB — COMPREHENSIVE METABOLIC PANEL
ALBUMIN: 4.2 g/dL (ref 3.6–5.1)
ALT: 9 U/L (ref 6–29)
AST: 18 U/L (ref 10–35)
Alkaline Phosphatase: 74 U/L (ref 33–130)
BILIRUBIN TOTAL: 0.6 mg/dL (ref 0.2–1.2)
BUN: 11 mg/dL (ref 7–25)
CALCIUM: 9.2 mg/dL (ref 8.6–10.4)
CHLORIDE: 105 mmol/L (ref 98–110)
CO2: 26 mmol/L (ref 20–31)
Creat: 0.68 mg/dL (ref 0.50–1.05)
Glucose, Bld: 79 mg/dL (ref 65–99)
POTASSIUM: 3.6 mmol/L (ref 3.5–5.3)
Sodium: 141 mmol/L (ref 135–146)
Total Protein: 7.1 g/dL (ref 6.1–8.1)

## 2016-05-08 LAB — POCT URINALYSIS DIP (MANUAL ENTRY)
BILIRUBIN UA: NEGATIVE
BILIRUBIN UA: NEGATIVE
GLUCOSE UA: NEGATIVE
NITRITE UA: NEGATIVE
RBC UA: NEGATIVE
Spec Grav, UA: 1.015
Urobilinogen, UA: 2
pH, UA: 8

## 2016-05-08 LAB — CBC
HEMATOCRIT: 38 % (ref 35.0–45.0)
HEMOGLOBIN: 12.7 g/dL (ref 11.7–15.5)
MCH: 27.9 pg (ref 27.0–33.0)
MCHC: 33.4 g/dL (ref 32.0–36.0)
MCV: 83.5 fL (ref 80.0–100.0)
MPV: 8.9 fL (ref 7.5–12.5)
Platelets: 241 10*3/uL (ref 140–400)
RBC: 4.55 MIL/uL (ref 3.80–5.10)
RDW: 14.2 % (ref 11.0–15.0)
WBC: 2.8 10*3/uL — ABNORMAL LOW (ref 3.8–10.8)

## 2016-05-08 LAB — LIPID PANEL
CHOL/HDL RATIO: 3.6 ratio (ref <=5.0)
CHOLESTEROL: 181 mg/dL (ref 125–200)
HDL: 50 mg/dL (ref >=46)
LDL Cholesterol: 111 mg/dL (ref <130)
TRIGLYCERIDES: 98 mg/dL (ref <150)
VLDL: 20 mg/dL (ref <30)

## 2016-05-08 LAB — TSH: TSH: 0.34 mIU/L — ABNORMAL LOW

## 2016-05-08 MED ORDER — ACETAMINOPHEN-CODEINE #3 300-30 MG PO TABS: 1.0000 | ORAL_TABLET | ORAL | 0 refills | Status: DC | PRN

## 2016-05-08 MED ORDER — AMLODIPINE BESYLATE 5 MG PO TABS: 5.0000 mg | ORAL_TABLET | Freq: Every day | ORAL | 3 refills | Status: DC

## 2016-05-08 MED ORDER — PROMETHAZINE HCL 25 MG PO TABS: 25.0000 mg | ORAL_TABLET | Freq: Three times a day (TID) | ORAL | 0 refills | Status: DC | PRN

## 2016-05-08 MED ORDER — MELOXICAM 15 MG PO TABS: 15.0000 mg | ORAL_TABLET | Freq: Every day | ORAL | 1 refills | Status: DC

## 2016-05-08 MED ORDER — BUTALBITAL-APAP-CAFFEINE 50-325-40 MG PO TABS: 1.0000 | ORAL_TABLET | Freq: Four times a day (QID) | ORAL | 0 refills | Status: DC | PRN

## 2016-05-08 MED ORDER — AMITRIPTYLINE HCL 75 MG PO TABS: 75.0000 mg | ORAL_TABLET | Freq: Every day | ORAL | 1 refills | Status: DC

## 2016-05-08 MED ORDER — ERGOCALCIFEROL 1.25 MG (50000 UT) PO CAPS: 50000.0000 [IU] | ORAL_CAPSULE | ORAL | 0 refills | Status: DC

## 2016-05-08 MED ORDER — TRAMADOL HCL 50 MG PO TABS: 50.0000 mg | ORAL_TABLET | Freq: Four times a day (QID) | ORAL | 0 refills | Status: DC | PRN

## 2016-05-08 NOTE — Patient Instructions (Addendum)
IF you received an x-ray today, you will receive an invoice from [facility]. Please contact [facility] at [phone] with questions or concerns regarding your invoice.   IF you received labwork today, you will receive an invoice from [employer]. Please contact [facility] at [phone] with questions or concerns regarding your invoice.   Our billing staff will not be able to assist you with questions regarding bills from these companies.  You will be contacted with the lab results as soon as they are available. The fastest way to get your results is to activate your My Chart account. Instructions are located on the last page of this paperwork. If you have not heard from [place] regarding the results in 2 weeks, please contact this office.   Menopause is a normal process in which your reproductive ability comes to an end. This process happens gradually over a span of months to years, usually between the ages of 32 and 71. Menopause is complete when you have missed 12 consecutive menstrual periods. It is important to talk with your health care provider about some of the most common conditions that affect postmenopausal women, such as heart disease, cancer, and bone loss (osteoporosis). Adopting a healthy lifestyle and getting preventive care can help to promote your health and wellness. Those actions can also lower your chances of developing some of these common conditions. WHAT SHOULD I KNOW ABOUT MENOPAUSE? During menopause, you may experience a number of symptoms, such as:  Moderate-to-severe hot flashes.  Night sweats.  Decrease in sex drive.  Mood swings.  Headaches.  Tiredness.  Irritability.  Memory problems.  Insomnia. Choosing to treat or not to treat menopausal changes is an individual decision that you make with your health care provider. WHAT SHOULD I KNOW ABOUT HORMONE REPLACEMENT THERAPY AND SUPPLEMENTS? Hormone therapy  products are effective for treating symptoms that are associated with menopause, such as hot flashes and night sweats. Hormone replacement carries certain risks, especially as you become older. If you are thinking about using estrogen or estrogen with progestin treatments, discuss the benefits and risks with your health care provider. WHAT SHOULD I KNOW ABOUT HEART DISEASE AND STROKE? Heart disease, heart attack, and stroke become more likely as you age. This may be due, in part, to the hormonal changes that your body experiences during menopause. These can affect how your body processes dietary fats, triglycerides, and cholesterol. Heart attack and stroke are both medical emergencies. There are many things that you can do to help prevent heart disease and stroke:  Have your blood pressure checked at least every 1-2 years. High blood pressure causes heart disease and increases the risk of stroke.  If you are 59-1 years old, ask your health care provider if you should take aspirin to prevent a heart attack or a stroke.  Do not use any tobacco products, including cigarettes, chewing tobacco, or electronic cigarettes. If you need help quitting, ask your health care provider.  It is important to eat a healthy diet and maintain a healthy weight.  Be sure to include plenty of vegetables, fruits, low-fat dairy products, and lean protein.  Avoid eating foods that are high in solid fats, added sugars, or salt (sodium).  Get regular exercise. This is one of the most important things that you can do for your health.  Try to exercise for at least 150 minutes each week. The type of exercise that you do should increase your heart rate and make you  sweat. This is known as moderate-intensity exercise.  Try to do strengthening exercises at least twice each week. Do these in addition to the moderate-intensity exercise.  Know your numbers.Ask your health care provider to check your cholesterol and your blood  glucose. Continue to have your blood tested as directed by your health care provider. WHAT SHOULD I KNOW ABOUT CANCER SCREENING? There are several types of cancer. Take the following steps to reduce your risk and to catch any cancer development as early as possible. Breast Cancer  Practice breast self-awareness.  This means understanding how your breasts normally appear and feel.  It also means doing regular breast self-exams. Let your health care provider know about any changes, no matter how small.  If you are 71 or older, have a clinician do a breast exam (clinical breast exam or CBE) every year. Depending on your age, family history, and medical history, it may be recommended that you also have a yearly breast X-ray (mammogram).  If you have a family history of breast cancer, talk with your health care provider about genetic screening.  If you are at high risk for breast cancer, talk with your health care provider about having an MRI and a mammogram every year.  Breast cancer (BRCA) gene test is recommended for women who have family members with BRCA-related cancers. Results of the assessment will determine the need for genetic counseling and BRCA1 and for BRCA2 testing. BRCA-related cancers include these types:  Breast. This occurs in males or females.  Ovarian.  Tubal. This may also be called fallopian tube cancer.  Cancer of the abdominal or pelvic lining (peritoneal cancer).  Prostate.  Pancreatic. Cervical, Uterine, and Ovarian Cancer Your health care provider may recommend that you be screened regularly for cancer of the pelvic organs. These include your ovaries, uterus, and vagina. This screening involves a pelvic exam, which includes checking for microscopic changes to the surface of your cervix (Pap test).  For women ages 21-65, health care providers may recommend a pelvic exam and a Pap test every three years. For women ages 25-65, they may recommend the Pap test and  pelvic exam, combined with testing for human papilloma virus (HPV), every five years. Some types of HPV increase your risk of cervical cancer. Testing for HPV may also be done on women of any age who have unclear Pap test results.  Other health care providers may not recommend any screening for nonpregnant women who are considered low risk for pelvic cancer and have no symptoms. Ask your health care provider if a screening pelvic exam is right for you.  If you have had past treatment for cervical cancer or a condition that could lead to cancer, you need Pap tests and screening for cancer for at least 20 years after your treatment. If Pap tests have been discontinued for you, your risk factors (such as having a new sexual partner) need to be reassessed to determine if you should start having screenings again. Some women have medical problems that increase the chance of getting cervical cancer. In these cases, your health care provider may recommend that you have screening and Pap tests more often.  If you have a family history of uterine cancer or ovarian cancer, talk with your health care provider about genetic screening.  If you have vaginal bleeding after reaching menopause, tell your health care provider.  There are currently no reliable tests available to screen for ovarian cancer. Lung Cancer Lung cancer screening is recommended for adults  adults 55-80 years old who are at high risk for lung cancer because of a history of smoking. A yearly low-dose CT scan of the lungs is recommended if you:  Currently smoke.  Have a history of at least 30 pack-years of smoking and you currently smoke or have quit within the past 15 years. A pack-year is smoking an average of one pack of cigarettes per day for one year. Yearly screening should:  Continue until it has been 15 years since you quit.  Stop if you develop a health problem that would prevent you from having lung cancer treatment. Colorectal  Cancer  This type of cancer can be detected and can often be prevented.  Routine colorectal cancer screening usually begins at age 50 and continues through age 75.  If you have risk factors for colon cancer, your health care provider may recommend that you be screened at an earlier age.  If you have a family history of colorectal cancer, talk with your health care provider about genetic screening.  Your health care provider may also recommend using home test kits to check for hidden blood in your stool.  A small camera at the end of a tube can be used to examine your colon directly (sigmoidoscopy or colonoscopy). This is done to check for the earliest forms of colorectal cancer.  Direct examination of the colon should be repeated every 5-10 years until age 75. However, if early forms of precancerous polyps or small growths are found or if you have a family history or genetic risk for colorectal cancer, you may need to be screened more often. Skin Cancer  Check your skin from head to toe regularly.  Monitor any moles. Be sure to tell your health care provider:  About any new moles or changes in moles, especially if there is a change in a mole's shape or color.  If you have a mole that is larger than the size of a pencil eraser.  If any of your family members has a history of skin cancer, especially at a young age, talk with your health care provider about genetic screening.  Always use sunscreen. Apply sunscreen liberally and repeatedly throughout the day.  Whenever you are outside, protect yourself by wearing long sleeves, pants, a wide-brimmed hat, and sunglasses. WHAT SHOULD I KNOW ABOUT OSTEOPOROSIS? Osteoporosis is a condition in which bone destruction happens more quickly than new bone creation. After menopause, you may be at an increased risk for osteoporosis. To help prevent osteoporosis or the bone fractures that can happen because of osteoporosis, the following is  recommended:  If you are 19-50 years old, get at least 1,000 mg of calcium and at least 600 mg of vitamin D per day.  If you are older than age 50 but younger than age 70, get at least 1,200 mg of calcium and at least 600 mg of vitamin D per day.  If you are older than age 70, get at least 1,200 mg of calcium and at least 800 mg of vitamin D per day. Smoking and excessive alcohol intake increase the risk of osteoporosis. Eat foods that are rich in calcium and vitamin D, and do weight-bearing exercises several times each week as directed by your health care provider. WHAT SHOULD I KNOW ABOUT HOW MENOPAUSE AFFECTS MY MENTAL HEALTH? Depression may occur at any age, but it is more common as you become older. Common symptoms of depression include:  Low or sad mood.  Changes in sleep patterns.    in appetite or eating patterns.  Feeling an overall lack of motivation or enjoyment of activities that you previously enjoyed.  Frequent crying spells. Talk with your health care provider if you think that you are experiencing depression. WHAT SHOULD I KNOW ABOUT IMMUNIZATIONS? It is important that you get and maintain your immunizations. These include:  Tetanus, diphtheria, and pertussis (Tdap) booster vaccine.  Influenza every year before the flu season begins.  Pneumonia vaccine.  Shingles vaccine. Your health care provider may also recommend other immunizations.   This information is not intended to replace advice given to you by your health care provider. Make sure you discuss any questions you have with your health care provider.   Document Released: 11/15/2005 Document Revised: 10/14/2014 Document Reviewed: 05/26/2014 Elsevier Interactive Patient Education 2016 Elsevier Inc.   Chronic Fatigue Syndrome Chronic fatigue syndrome (CFS) is a condition in which there is lasting, extreme tiredness (fatigue) that does not improve with rest. CFS affects women up to four times more  often than men. If you have CFS, fatigue and other symptoms can make it hard for you to get through your day. There is no treatment or cure. You will need to work closely with your health care provider to come up with a treatment plan that works for you. CAUSES  No one knows what causes CFS. It may be triggered by a flu-like illness or by mono. Other triggers may include:  An abnormal immune system.  Low blood pressure.  Poor diet.  Physical or emotional stress. SIGNS AND SYMPTOMS The main symptom is fatigue that lasts all day, especially after physical or mental stress. Other common symptoms include:  An extreme loss of energy with no obvious cause.  Muscle or joint soreness.  Severe weakness.  Frequent headaches.  Fever.  Sore throat.  Swollen lymph glands.  Sleep is not refreshing.  Loss of concentration or memory. Less common symptoms may include:  Chills.  Night sweats.  Tingling or numbness.  Blurred vision.  Dizziness.  Sensitivity to noise or odors.  Mood swings.  Anxiety, panic attacks, and depression. Your symptoms may come and go, or you may have them all the time. DIAGNOSIS  There are no tests that can help health care providers diagnose CFS. It may take a long time for you to get a correct diagnosis. Your health care provider may need to do a number of tests to rule out other conditions that could be causing your symptoms. You may be diagnosed with CFS if:  You have fatigue that has lasted for at least six months.  Your fatigue is not relieved by rest.  Your fatigue is not caused by another condition.  Your fatigue is severe enough to interfere with work and daily activities.  You have at least four common symptoms of CFS. TREATMENT  There is no cure for CFS at this time. The condition affects everyone differently. You will need to work with your health care provider to find the best treatment for your symptoms. Treatment may  include:  Improving sleep with a regular bedtime routine.  Avoiding caffeine, alcohol, and tobacco.  Doing light exercise and stretching during the day.  Taking medicine to help you sleep.  Taking over-the-counter medicines to relieve joint or muscle pain.  Learning and practicing relaxation techniques.  Using memory aids or doing brain teasers to improve memory and concentration.  Seeing a mental health professional to evaluate and treat depression, if necessary.  Trying massage therapy, acupuncture, and movement exercises,  like yoga or tai [place] Work closely with your health care provider to follow your treatment plan at home. You may need to make major lifestyle changes. If treatment does not seem to help, get a second opinion. You may get help from many health care providers, including doctors, mental health specialists, physical therapists, and rehabilitation therapists. Having the support of friends and loved ones is also important. SEEK MEDICAL CARE IF:  Your symptoms are not responding to treatment.  You are having strong feelings of anger, guilt, anxiety, or depression.   This information is not intended to replace advice given to you by your health care provider. Make sure you discuss any questions you have with your health care provider.   Document Released: 10/31/2004 Document Revised: 10/14/2014 Document Reviewed: 08/13/2013 Elsevier Interactive Patient Education [employer].

## 2016-05-08 NOTE — Progress Notes (Signed)
Subjective:    Patient ID: Diane [name], female    DOB: 1955/10/25, 60 y.o.   MRN: [id number] Chief Complaint  Patient presents with  . Annual Exam    WITH PAP     HPI  Diane [name] is here for a 3 month follow-up on her complete physical.  Vit D def:  Left lateral hip pain x 2 wks, hurst a tons night Taking meloxicam and amitriptyline. She was given just a few tabs of hydrocodone for her back. She is using the tylenol #3 2-3x/d - she alternates this with the tramadol so she doesn't become to tolerance to it.    She is fasting today.  Not seeing Dr. Ubaldo Glassing as retired [place] 2010 Has extensive h/o abnml paps - did have cryo prior. Last pap 02/2012 nml other than BV. Has had upper and lower endoscopy prior but she does not remember where.  No mvi Had bowel obstruction surgery so the colonoscopy was a big risk for her  GI on [address] - thinks clean colonosocpy - repeat in 10 yrs - across from the hosp.  Past Medical History:  Diagnosis Date  . Headache(784.0)   . Hypertension    No past surgical history on file. No current outpatient prescriptions on file prior to visit.   No current facility-administered medications on file prior to visit.    Allergies  Allergen Reactions  . Topamax [Topiramate] Hives   Family History  Problem Relation Age of Onset  . Healthy Mother    Social History   Social History  . Marital status: Single    Spouse name: [name]  . Number of children: 4  . Years of education: 12   Occupational History  .      [employer]   Social History Main Topics  . Smoking status: Former Smoker    Quit date: 10/08/1987  . Smokeless tobacco: None  . Alcohol use 0.0 oz/week     Comment: 1 glass of wine occasionally  . Drug use: No  . Sexual activity: Not Asked   Other Topics Concern  . None   Social History Narrative   Pt lives at home with her husband, daughter, and 2 grandchildren.    She has four children    Husband needs high-level of her  care due to h/o brain tumor   Drives a city bus   [place] working outside in her yard and gardening.   drinks one cup of coffee daily.    Depression screen [facility] 2/9 05/08/2016 02/12/2016 02/01/2016 11/28/2015 09/01/2015  Decreased Interest 0 0 0 0 0  Down, Depressed, Hopeless 0 0 0 0 0  PHQ - 2 Score 0 0 0 0 0    Review of Systems  All other systems reviewed and are negative.      Objective:   Physical Exam  Constitutional: She is oriented to person, place, and time. She appears well-developed and well-nourished. No distress.  HENT:  Head: Normocephalic and atraumatic.  Right Ear: Tympanic membrane, external ear and ear canal normal.  Left Ear: Tympanic membrane, external ear and ear canal normal.  Nose: No rhinorrhea.  Mouth/Throat: Uvula is midline, oropharynx is clear and moist and mucous membranes are normal. No posterior oropharyngeal erythema.  Eyes: Conjunctivae and EOM are normal. Pupils are equal, round, and reactive to light. Right eye exhibits no discharge. Left eye exhibits no discharge. No scleral icterus.  Neck: Normal range of motion. Neck supple. No thyromegaly present.  Cardiovascular: Normal  rate, regular rhythm, normal heart sounds and intact distal pulses.   Pulmonary/Chest: Effort normal and breath sounds normal. No respiratory distress.  Abdominal: Soft. Bowel sounds are normal. There is no tenderness.  Genitourinary: Vagina normal and uterus normal. No breast swelling, tenderness, discharge or bleeding. Cervix exhibits no motion tenderness and no friability. Right adnexum displays no mass and no tenderness. Left adnexum displays no mass and no tenderness.  Musculoskeletal: She exhibits no edema.  Lymphadenopathy:    She has no cervical adenopathy.  Neurological: She is alert and oriented to person, place, and time. She has normal reflexes.  Skin: Skin is warm and dry. She is not diaphoretic. No erythema.  Psychiatric: She has a normal mood and affect. Her behavior is  normal.      BP 122/86 (BP Location: Right Arm, Patient Position: Sitting, Cuff Size: Normal)   Pulse 74   Temp 98.3 F (36.8 C) (Oral)   Resp 17   Ht 5' 4.5" (1.638 m)   Wt 163 lb (73.9 kg)   SpO2 98%   BMI 27.55 kg/m      Assessment & Plan:   1. Annual physical exam   2. Routine screening for STI (sexually transmitted infection)   3. Screening for breast cancer   4. Screening for cardiovascular, respiratory, and genitourinary diseases   5. Screening for cervical cancer   6. Screening for colorectal cancer   7. Screening for deficiency anemia   8. Screening for thyroid disorder   9. Essential hypertension   10. Fibromyalgia   11. Depression   12. Insomnia   13. Polypharmacy   14. Leukopenia   15. Vitamin D deficiency - Her pharmacy only gave her 3 tabs of the vit D on May although I sent in 24 so presumable d is still low - retry rx.  16. Need for prophylactic vaccination with combined diphtheria-tetanus-pertussis (DTP) vaccine   17. Right-sided low back pain with right-sided sciatica     Orders Placed This Encounter  Procedures  . MM Digital Screening    Standing Status:   Future    Standing Expiration Date:   07/08/2017    Order Specific Question:   Reason for Exam (SYMPTOM  OR DIAGNOSIS REQUIRED)    Answer:   screening    Order Specific Question:   Is the patient pregnant?    Answer:   No    Order Specific Question:   Preferred imaging location?    Answer:   [facility]  . Tdap vaccine greater than or equal to 7yo IM  . CBC  . Comprehensive metabolic panel    Order Specific Question:   Has the patient fasted?    Answer:   Yes  . TSH  . Lipid panel    Order Specific Question:   Has the patient fasted?    Answer:   Yes  . Hepatitis C Antibody  . POCT urinalysis dipstick    Meds ordered this encounter  Medications  . traMADol (ULTRAM) 50 MG tablet    Sig: Take 1-2 tablets (50-100 mg total) by mouth every 6 (six) hours as needed.    Dispense:  270  tablet    Refill:  0  . acetaminophen-codeine (TYLENOL #3) 300-30 MG tablet    Sig: Take 1 tablet by mouth every 4 (four) hours as needed for moderate pain.    Dispense:  120 tablet    Refill:  0  . amitriptyline (ELAVIL) 75 MG tablet    Sig:  Take 1 tablet (75 mg total) by mouth at bedtime.    Dispense:  90 tablet    Refill:  1  . amLODipine (NORVASC) 5 MG tablet    Sig: Take 1 tablet (5 mg total) by mouth daily.    Dispense:  90 tablet    Refill:  3  . butalbital-acetaminophen-caffeine (FIORICET) 50-325-40 MG tablet    Sig: Take 1-2 tablets by mouth every 6 (six) hours as needed for headache.    Dispense:  80 tablet    Refill:  0  . meloxicam (MOBIC) 15 MG tablet    Sig: Take 1 tablet (15 mg total) by mouth daily.    Dispense:  30 tablet    Refill:  1  . promethazine (PHENERGAN) 25 MG tablet    Sig: Take 1 tablet (25 mg total) by mouth every 8 (eight) hours as needed for nausea or vomiting (migraine headache).    Dispense:  80 tablet    Refill:  0  . ergocalciferol (VITAMIN D2) 50000 units capsule    Sig: Take 1 capsule (50,000 Units total) by mouth once a week.    Dispense:  24 capsule    Refill:  0    [name], M.D.  [facility] [place] [address] [place], [place] [address] [phone] phone [phone] fax  05/12/16 11:24 PM

## 2016-05-09 LAB — HEPATITIS C ANTIBODY: HCV Ab: NEGATIVE

## 2016-05-09 LAB — PAP IG AND HPV HIGH-RISK: HPV DNA HIGH RISK: NOT DETECTED

## 2016-05-19 ENCOUNTER — Encounter: Payer: Self-pay | Admitting: Family Medicine

## 2016-05-30 ENCOUNTER — Encounter: Payer: 59 | Admitting: Family Medicine

## 2016-07-05 ENCOUNTER — Ambulatory Visit
Admission: RE | Admit: 2016-07-05 | Discharge: 2016-07-05 | Disposition: A | Discharge status: Home / Self Care | Payer: BLUE CROSS/BLUE SHIELD | Source: Ambulatory Visit | Attending: Family Medicine | Admitting: Family Medicine

## 2016-07-05 DIAGNOSIS — Z1231 Encounter for screening mammogram for malignant neoplasm of breast: Secondary | ICD-10-CM | POA: Diagnosis not present

## 2016-07-05 DIAGNOSIS — Z0000 Encounter for general adult medical examination without abnormal findings: Secondary | ICD-10-CM

## 2016-07-05 DIAGNOSIS — Z1239 Encounter for other screening for malignant neoplasm of breast: Secondary | ICD-10-CM

## 2016-07-19 ENCOUNTER — Other Ambulatory Visit: Payer: Self-pay | Admitting: Family Medicine

## 2016-07-21 NOTE — Telephone Encounter (Signed)
Is patient still taking zoloft?

## 2016-08-07 ENCOUNTER — Ambulatory Visit (INDEPENDENT_AMBULATORY_CARE_PROVIDER_SITE_OTHER): Payer: BLUE CROSS/BLUE SHIELD | Admitting: Family Medicine

## 2016-08-07 ENCOUNTER — Ambulatory Visit (INDEPENDENT_AMBULATORY_CARE_PROVIDER_SITE_OTHER): Payer: BLUE CROSS/BLUE SHIELD

## 2016-08-07 VITALS — BP 138/74 | HR 71 | Temp 98.2°F | Resp 16 | Ht 64.5 in | Wt 160.6 lb

## 2016-08-07 DIAGNOSIS — R042 Hemoptysis: Secondary | ICD-10-CM | POA: Diagnosis not present

## 2016-08-07 DIAGNOSIS — J069 Acute upper respiratory infection, unspecified: Secondary | ICD-10-CM | POA: Diagnosis not present

## 2016-08-07 DIAGNOSIS — R05 Cough: Secondary | ICD-10-CM | POA: Diagnosis not present

## 2016-08-07 DIAGNOSIS — J209 Acute bronchitis, unspecified: Secondary | ICD-10-CM

## 2016-08-07 MED ORDER — CODEINE POLT-CHLORPHEN POLT ER 14.7-2.8 MG/5ML PO SUER: 10.0000 mL | Freq: Two times a day (BID) | ORAL | 0 refills | Status: DC | PRN

## 2016-08-07 MED ORDER — AZITHROMYCIN 500 MG PO TABS: 500.0000 mg | ORAL_TABLET | Freq: Every day | ORAL | 0 refills | Status: DC

## 2016-08-07 NOTE — Patient Instructions (Addendum)
   IF you received an x-ray today, you will receive an invoice from [facility]. Please contact [facility] at [phone] with questions or concerns regarding your invoice.   IF you received labwork today, you will receive an invoice from [facility]. Please contact [facility] at [phone] with questions or concerns regarding your invoice.   Our billing staff will not be able to assist you with questions regarding bills from these companies.  You will be contacted with the lab results as soon as they are available. The fastest way to get your results is to activate your My Chart account. Instructions are located on the last page of this paperwork. If you have not heard from us regarding the results in 2 weeks, please contact this office.    Acute Bronchitis Bronchitis is inflammation of the airways that extend from the windpipe into the lungs (bronchi). The inflammation often causes mucus to develop. This leads to a cough, which is the most common symptom of bronchitis.  In acute bronchitis, the condition usually develops suddenly and goes away over time, usually in a couple weeks. Smoking, allergies, and asthma can make bronchitis worse. Repeated episodes of bronchitis may cause further lung problems.  CAUSES Acute bronchitis is most often caused by the same virus that causes a cold. The virus can spread from person to person (contagious) through coughing, sneezing, and touching contaminated objects. SIGNS AND SYMPTOMS   Cough.   Fever.   Coughing up mucus.   Body aches.   Chest congestion.   Chills.   Shortness of breath.   Sore throat.  DIAGNOSIS  Acute bronchitis is usually diagnosed through a physical exam. Your health care provider will also ask you questions about your medical history. Tests, such as chest X-rays, are sometimes done to rule out other conditions.  TREATMENT  Acute bronchitis usually goes away in a  couple weeks. Oftentimes, no medical treatment is necessary. Medicines are sometimes given for relief of fever or cough. Antibiotic medicines are usually not needed but may be prescribed in certain situations. In some cases, an inhaler may be recommended to help reduce shortness of breath and control the cough. A cool mist vaporizer may also be used to help thin bronchial secretions and make it easier to clear the chest.  HOME CARE INSTRUCTIONS  Get plenty of rest.   Drink enough fluids to keep your urine clear or pale yellow (unless you have a medical condition that requires fluid restriction). Increasing fluids may help thin your respiratory secretions (sputum) and reduce chest congestion, and it will prevent dehydration.   Take medicines only as directed by your health care provider.  If you were prescribed an antibiotic medicine, finish it all even if you start to feel better.  Avoid smoking and secondhand smoke. Exposure to cigarette smoke or irritating chemicals will make bronchitis worse. If you are a smoker, consider using nicotine gum or skin patches to help control withdrawal symptoms. Quitting smoking will help your lungs heal faster.   Reduce the chances of another bout of acute bronchitis by washing your hands frequently, avoiding people with cold symptoms, and trying not to touch your hands to your mouth, nose, or eyes.   Keep all follow-up visits as directed by your health care provider.  SEEK MEDICAL CARE IF: Your symptoms do not improve after 1 week of treatment.  SEEK IMMEDIATE MEDICAL CARE IF:  You develop an increased fever or chills.   You have chest pain.     You have severe shortness of breath.  You have bloody sputum.   You develop dehydration.  You faint or repeatedly feel like you are going to pass out.  You develop repeated vomiting.  You develop a severe headache. MAKE SURE YOU:   Understand these instructions.  Will watch your  condition.  Will get help right away if you are not doing well or get worse.   This information is not intended to replace advice given to you by your health care provider. Make sure you discuss any questions you have with your health care provider.   Document Released: 10/31/2004 Document Revised: 10/14/2014 Document Reviewed: 03/16/2013 Elsevier Interactive Patient Education 2016 [employer] Inc[employer]  

## 2016-08-07 NOTE — Progress Notes (Signed)
Subjective:  By signing my name below, I, Essence Howell, attest that this documentation has been prepared under the direction and in the presence of [name], MD Electronically Signed: Ladene Artist, ED Scribe 08/07/2016 at 11:32 AM.   Patient ID: Diane [name], female    DOB: Aug 25, 1956, 60 y.o.   MRN: [id number]  Chief Complaint  Patient presents with  . Cough    Productive, coughing up blood   HPI HPI Comments: Diane [name] is a 60 y.o. female who presents to the [facility] complaining of hemoptysis first noticed last night and again this morning. Pt describes sputum production as bright red in nature. She states that cough is so severe that it keeps her up at night. Pt states that symptoms started as sore throat last week that has resolved, but she has also been experiencing associated symptoms of sinus pressure and nasal congestion. She has tried Zyrtec, Theraflu, Robitussin and cough drops without significant relief. Pt denies fever, chills, fatigue, chest pain and sob.  Past Medical History:  Diagnosis Date  . Headache(784.0)   . Hypertension     Current Outpatient Prescriptions on File Prior to Visit  Medication Sig Dispense Refill  . acetaminophen-codeine (TYLENOL #3) 300-30 MG tablet Take 1 tablet by mouth every 4 (four) hours as needed for moderate pain. 120 tablet 0  . amitriptyline (ELAVIL) 75 MG tablet Take 1 tablet (75 mg total) by mouth at bedtime. 90 tablet 1  . amLODipine (NORVASC) 5 MG tablet Take 1 tablet (5 mg total) by mouth daily. 90 tablet 3  . butalbital-acetaminophen-caffeine (FIORICET) 50-325-40 MG tablet Take 1-2 tablets by mouth every 6 (six) hours as needed for headache. 80 tablet 0  . ergocalciferol (VITAMIN D2) 50000 units capsule Take 1 capsule (50,000 Units total) by mouth once a week. 24 capsule 0  . meloxicam (MOBIC) 15 MG tablet Take 1 tablet (15 mg total) by mouth daily. 30 tablet 1  . promethazine (PHENERGAN) 25  MG tablet Take 1 tablet (25 mg total) by mouth every 8 (eight) hours as needed for nausea or vomiting (migraine headache). 80 tablet 0  . sertraline (ZOLOFT) 50 MG tablet TAKE 1 TABLET BY MOUTH DAILY 90 tablet 1  . traMADol (ULTRAM) 50 MG tablet Take 1-2 tablets (50-100 mg total) by mouth every 6 (six) hours as needed. 270 tablet 0   No current facility-administered medications on file prior to visit.    Allergies  Allergen Reactions  . Topamax [Topiramate] Hives   Review of Systems  Constitutional: Negative for chills, fatigue and fever.  HENT: Positive for congestion, sinus pressure and sore throat (resolved).   Respiratory: Positive for cough. Negative for shortness of breath.   Cardiovascular: Negative for chest pain.   BP 138/74   Pulse 71   Temp 98.2 F (36.8 C) (Oral)   Resp 16   Ht 5' 4.5" (1.638 m)   Wt 160 lb 9.6 oz (72.8 kg)   SpO2 98%   BMI 27.14 kg/m     Objective:   Physical Exam  Constitutional: She is oriented to person, place, and time. She appears well-developed and well-nourished. No distress.  HENT:  Head: Normocephalic and atraumatic.  Right Ear: Tympanic membrane normal.  Left Ear: Tympanic membrane normal.  Nose: Nose normal.  Mouth/Throat: Oropharynx is clear and moist. No oropharyngeal exudate, posterior oropharyngeal edema or posterior oropharyngeal erythema.  Eyes: Conjunctivae and EOM are normal.  Neck: Neck supple. No tracheal deviation present.  Cardiovascular: Normal rate, regular rhythm, S1 normal, S2 normal and normal heart sounds.   Pulmonary/Chest: Effort normal and breath sounds normal. No respiratory distress.  Lungs are clear to auscultation.   Musculoskeletal: Normal range of motion.  Lymphadenopathy:    She has no cervical adenopathy.  Neurological: She is alert and oriented to person, place, and time.  Skin: Skin is warm and dry.  Psychiatric: She has a normal mood and affect. Her behavior is normal.  Nursing note and vitals  reviewed.  Dg Chest 2 View  Result Date: 08/07/2016 CLINICAL DATA:  Cough for 1 week, hemoptysis. EXAM: CHEST  2 VIEW COMPARISON:  02/16/2013 FINDINGS: Heart and mediastinal contours are within normal limits. No focal opacities or effusions. No acute bony abnormality. IMPRESSION: No active cardiopulmonary disease. Electronically Signed   By: Rolm Baptise M.D.   On: 08/07/2016 12:13       Assessment & Plan:   1. Hemoptysis   2. Upper respiratory tract infection, unspecified type   3. Acute bronchitis, unspecified organism     Orders Placed This Encounter  Procedures  . DG Chest 2 View    Standing Status:   Future    Number of Occurrences:   1    Standing Expiration Date:   08/07/2017    Order Specific Question:   Reason for Exam (SYMPTOM  OR DIAGNOSIS REQUIRED)    Answer:   cough x 1 wk, now with hemoptysis starting last night, exam nml    Order Specific Question:   Is the patient pregnant?    Answer:   No    Order Specific Question:   Preferred imaging location?    Answer:   External    Meds ordered this encounter  Medications  . Phenylephrine-Pheniramine-DM (THERAFLU COLD & COUGH PO)    Sig: Take by mouth.  . Codeine Polt-Chlorphen Polt ER (TUZISTRA XR) 14.7-2.8 MG/5ML SUER    Sig: Take 10 mLs by mouth every 12 (twelve) hours as needed.    Dispense:  140 mL    Refill:  0  . azithromycin (ZITHROMAX) 500 MG tablet    Sig: Take 1 tablet (500 mg total) by mouth daily.    Dispense:  3 tablet    Refill:  0    I personally performed the services described in this documentation, which was scribed in my presence. The recorded information has been reviewed and considered, and addended by me as needed.   [name], M.D.  [facility] [address] [place], [place] [address] [phone] phone [phone] fax  08/11/16 10:44 PM

## 2016-08-10 ENCOUNTER — Emergency Department (HOSPITAL_COMMUNITY)
Admission: EM | Admit: 2016-08-10 | Discharge: 2016-08-10 | Disposition: A | Discharge status: Home / Self Care | Payer: BLUE CROSS/BLUE SHIELD | Attending: Emergency Medicine | Admitting: Emergency Medicine

## 2016-08-10 ENCOUNTER — Encounter (HOSPITAL_COMMUNITY): Payer: Self-pay | Admitting: *Deleted

## 2016-08-10 DIAGNOSIS — F4320 Adjustment disorder, unspecified: Secondary | ICD-10-CM | POA: Diagnosis not present

## 2016-08-10 DIAGNOSIS — R55 Syncope and collapse: Secondary | ICD-10-CM | POA: Diagnosis present

## 2016-08-10 DIAGNOSIS — I10 Essential (primary) hypertension: Secondary | ICD-10-CM | POA: Insufficient documentation

## 2016-08-10 DIAGNOSIS — F29 Unspecified psychosis not due to a substance or known physiological condition: Secondary | ICD-10-CM | POA: Diagnosis not present

## 2016-08-10 DIAGNOSIS — F4321 Adjustment disorder with depressed mood: Secondary | ICD-10-CM

## 2016-08-10 DIAGNOSIS — Z87891 Personal history of nicotine dependence: Secondary | ICD-10-CM | POA: Diagnosis not present

## 2016-08-10 DIAGNOSIS — F419 Anxiety disorder, unspecified: Secondary | ICD-10-CM | POA: Diagnosis not present

## 2016-08-10 MED ORDER — LORAZEPAM 2 MG/ML IJ SOLN
1.0000 mg | Freq: Once | INTRAMUSCULAR | Status: AC
  Administered 2016-08-10: 1 mg via INTRAVENOUS
  Filled 2016-08-10: qty 1

## 2016-08-10 MED ORDER — LORAZEPAM 2 MG/ML IJ SOLN
1.0000 mg | Freq: Once | INTRAMUSCULAR | Status: AC
  Administered 2016-08-10: 1 mg via INTRAVENOUS

## 2016-08-10 MED ORDER — LORAZEPAM 2 MG/ML IJ SOLN
1.0000 mg | Freq: Once | INTRAMUSCULAR | Status: DC
  Filled 2016-08-10: qty 1

## 2016-08-10 NOTE — ED Notes (Signed)
Discharge instructions and follow up care reviewed with patient. Patient verbalized understanding. 

## 2016-08-10 NOTE — ED Notes (Signed)
Bed: [id number] Expected date:  Expected time:  Means of arrival:  Comments: 60 yo syncope

## 2016-08-10 NOTE — ED Notes (Signed)
ED Provider at bedside. 

## 2016-08-10 NOTE — ED Triage Notes (Addendum)
[facility]- Pt mother passed away and pt was attending funeral. Pt had syncopal episode. Pt is responsive to pain but is exhibiting s/sx of catatonic state. Pt refuses to interact with EMS or staff. Pt VSS. Pt last vs per EMS- BP 130 90, HR 90, RR 20, 98% RA, CBG 133. Pt in NAD.

## 2016-08-10 NOTE — ED Notes (Signed)
Patient ambulated in hallway.

## 2016-08-10 NOTE — ED Provider Notes (Signed)
[facility] DEPT Provider Note   CSN: [id number] Arrival date & time: 08/10/16  1359     History   Chief Complaint Chief Complaint  Patient presents with  . Near Syncope  Level 5 caveat due to unresponsiveness.  HPI Diane [name] is a 60 y.o. female.  HPI Patient presents after having decreased level of responsiveness at a funeral. States she was very close to the family member that died. 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 At the viewing after the funeral she then collapsed on the floor. Will not speak. She is lying in bed only turning her head back and forth repeatedly. Has not had episodes like this before. Will not answer my questions or follow my commands.   Past Medical History:  Diagnosis Date  . Headache(784.0)   . Hypertension     Patient Active Problem List   Diagnosis Date Noted  . Fibromyalgia 02/01/2016  . Arthritis, senescent 02/01/2016  . Vitamin D deficiency 02/01/2016  . Depression 08/31/2015  . Leukopenia 08/31/2015  . Polypharmacy 08/31/2015  . Migraine headache without aura 12/31/2012  . Hypertension 12/31/2012  . Insomnia 12/31/2012    History reviewed. No pertinent surgical history.  OB History    No data available       Home Medications    Prior to Admission medications   Medication Sig Start Date End Date Taking? Authorizing Provider  acetaminophen-codeine (TYLENOL #3) 300-30 MG tablet Take 1 tablet by mouth every 4 (four) hours as needed for moderate pain. 05/08/16   Shawnee Knapp, MD  amitriptyline (ELAVIL) 75 MG tablet Take 1 tablet (75 mg total) by mouth at bedtime. 05/08/16   Shawnee Knapp, MD  amLODipine (NORVASC) 5 MG tablet Take 1 tablet (5 mg total) by mouth daily. 05/08/16   Shawnee Knapp, MD  azithromycin (ZITHROMAX) 500 MG tablet Take 1 tablet (500 mg total) by mouth daily. 08/07/16   Shawnee Knapp, MD  butalbital-acetaminophen-caffeine (FIORICET) [phone] MG tablet Take 1-2 tablets by mouth every 6 (six) hours as needed for  headache. 05/08/16 05/08/17  Shawnee Knapp, MD  Codeine Polt-Chlorphen Polt ER (TUZISTRA XR) 14.7-2.8 MG/5ML SUER Take 10 mLs by mouth every 12 (twelve) hours as needed. 08/07/16   Shawnee Knapp, MD  ergocalciferol (VITAMIN D2) 50000 units capsule Take 1 capsule (50,000 Units total) by mouth once a week. 05/08/16   Shawnee Knapp, MD  meloxicam (MOBIC) 15 MG tablet Take 1 tablet (15 mg total) by mouth daily. 05/08/16   Shawnee Knapp, MD  Phenylephrine-Pheniramine-DM [facility] COLD & COUGH PO) Take by mouth.    Historical Provider, MD  promethazine (PHENERGAN) 25 MG tablet Take 1 tablet (25 mg total) by mouth every 8 (eight) hours as needed for nausea or vomiting (migraine headache). 05/08/16   Shawnee Knapp, MD  sertraline (ZOLOFT) 50 MG tablet TAKE 1 TABLET BY MOUTH DAILY 07/22/16   Shawnee Knapp, MD  traMADol (ULTRAM) 50 MG tablet Take 1-2 tablets (50-100 mg total) by mouth every 6 (six) hours as needed. 05/08/16   Shawnee Knapp, MD    Family History Family History  Problem Relation Age of Onset  . Healthy Mother     Social History Social History  Substance Use Topics  . Smoking status: Former Smoker    Quit date: 10/08/1987  . Smokeless tobacco: Not on file  . Alcohol use 0.0 oz/week     Comment: 1 glass of wine occasionally  Allergies   Topamax [topiramate]   Review of Systems Review of Systems  Unable to perform ROS: Mental status change     Physical Exam Updated Vital Signs BP 118/70   Pulse 95   Temp 97.6 F (36.4 C) (Axillary)   Resp 22   Wt 160 lb (72.6 kg)   SpO2 100%   BMI 27.04 kg/m   Physical Exam  Constitutional: She appears well-developed.  Eyes: Pupils are equal, round, and reactive to light.  Cardiovascular: Normal rate.   Pulmonary/Chest: Effort normal.  Abdominal: Soft. There is no tenderness.  Neurological:  Patient is lying in bed. She has her eyes closed and is repeatedly turning her head back and forth. She will not speak for me. Will not follow commands.  Skin:  Capillary refill takes less than 2 seconds.     ED Treatments / Results  Labs (all labs ordered are listed, but only abnormal results are displayed) Labs Reviewed - No data to display  EKG  EKG Interpretation  Date/Time:  Saturday August 10 2016 14:07:56 EDT Ventricular Rate:  89 PR Interval:    QRS Duration: 109 QT Interval:  383 QTC Calculation: 466 R Axis:   -24 Text Interpretation:  Sinus rhythm Borderline left axis deviation Low voltage, precordial leads Confirmed by Alvino Chapel  MD, Ovid Curd [phone]) on 08/10/2016 2:22:16 PM       Radiology No results found.  Procedures Procedures (including critical care time)  Medications Ordered in ED Medications  LORazepam (ATIVAN) injection 1 mg (1 mg Intravenous Given 08/10/16 1421)  LORazepam (ATIVAN) injection 1 mg (1 mg Intravenous Given 08/10/16 1517)     Initial Impression / Assessment and Plan / ED Course  I have reviewed the triage vital signs and the nursing notes.  Pertinent labs & imaging results that were available during my care of the patient were reviewed by me and considered in my medical decision making (see chart for details).  Clinical Course    Patient with likely severe grief reaction. Has been somewhat better after here. His been able ambulate. Will speak, particularly with family less with me. Will discharge home.  Final Clinical Impressions(s) / ED Diagnoses   Final diagnoses:  Grief reaction    New Prescriptions New Prescriptions   No medications on file     Davonna Belling, MD 08/10/16 1755

## 2016-08-10 NOTE — ED Notes (Signed)
Family at bedside. 

## 2016-09-20 ENCOUNTER — Ambulatory Visit (INDEPENDENT_AMBULATORY_CARE_PROVIDER_SITE_OTHER): Payer: BLUE CROSS/BLUE SHIELD | Admitting: Family Medicine

## 2016-09-20 VITALS — BP 148/92 | HR 71 | Temp 98.0°F | Ht 64.5 in | Wt 161.4 lb

## 2016-09-20 DIAGNOSIS — E559 Vitamin D deficiency, unspecified: Secondary | ICD-10-CM

## 2016-09-20 DIAGNOSIS — M7062 Trochanteric bursitis, left hip: Secondary | ICD-10-CM | POA: Diagnosis not present

## 2016-09-20 DIAGNOSIS — G5702 Lesion of sciatic nerve, left lower limb: Secondary | ICD-10-CM

## 2016-09-20 DIAGNOSIS — R946 Abnormal results of thyroid function studies: Secondary | ICD-10-CM | POA: Diagnosis not present

## 2016-09-20 DIAGNOSIS — D72819 Decreased white blood cell count, unspecified: Secondary | ICD-10-CM

## 2016-09-20 DIAGNOSIS — R7989 Other specified abnormal findings of blood chemistry: Secondary | ICD-10-CM

## 2016-09-20 DIAGNOSIS — D709 Neutropenia, unspecified: Secondary | ICD-10-CM

## 2016-09-20 DIAGNOSIS — M791 Myalgia, unspecified site: Secondary | ICD-10-CM

## 2016-09-20 MED ORDER — PREDNISONE 20 MG PO TABS: ORAL_TABLET | ORAL | 0 refills | Status: DC

## 2016-09-20 MED ORDER — HYDROCODONE-ACETAMINOPHEN 5-325 MG PO TABS: 1.0000 | ORAL_TABLET | ORAL | 0 refills | Status: DC | PRN

## 2016-09-20 NOTE — Progress Notes (Addendum)
Subjective:  By signing my name below, I, Diane [name], attest that this documentation has been prepared under the direction and in the presence of Diane Cheadle, MD.  Electronically Signed: Thea [name], ED Scribe. 09/20/2016. 6:23 PM.   Patient ID: Diane [name], female    DOB: 02/20/1956, 60 y.o.   MRN: [id number]  HPI  Chief Complaint  Patient presents with  . Knee Pain    X 1 mth Left knee pain  . Hip Pain    X 41mth left side hip pain    HPI Comments: Diane [name] is a 60 y.o. female who presents to the [facility] complaining of left hip pain onset 1 month. She denies injury or falls. She reports occasional back pain but pain is primarily to outer lateral aspect of left hip described as throbbing, burning pain. She has also notes pain in buttock and groin and pain radiates down to left knee and ankle. She reports worsening pain with walking and with lying on left side. She reports associated numbness and weakness of left leg. She has tried meloxicam once a day, tramadol 1-2 times a day tylenol #3 and [place]. Every night she takes tramadol, Elavil, and Fioricet at night which helps prevent HA's in the morning. Later in the day she will take Tylenol #3 while at work. She denies cough, blood in stool.   lspine XR 7 months prior showed  osteoporosis and deg disc disease worse at L4-5. With signs of bony narrowing of the neural foramina. At that time she was have right radiculopathy. She has had injections in back in the past.   She agrees to thyroid testing. She reports sweats and occasionally feeling hot. She denies changes in hair, diarrhea and tremors   Patient Active Problem List   Diagnosis Date Noted  . Fibromyalgia 02/01/2016  . Arthritis, senescent 02/01/2016  . Vitamin D deficiency 02/01/2016  . Depression 08/31/2015  . Leukopenia 08/31/2015  . Polypharmacy 08/31/2015  . Migraine headache without aura 12/31/2012  . Hypertension 12/31/2012  .  Insomnia 12/31/2012   Past Medical History:  Diagnosis Date  . Headache(784.0)   . Hypertension    History reviewed. No pertinent surgical history. Allergies  Allergen Reactions  . Topamax [Topiramate] Hives   Prior to Admission medications   Medication Sig Start Date End Date Taking? Authorizing Provider  acetaminophen-codeine (TYLENOL #3) 300-30 MG tablet Take 1 tablet by mouth every 4 (four) hours as needed for moderate pain. 05/08/16  Yes Shawnee Knapp, MD  amitriptyline (ELAVIL) 75 MG tablet Take 1 tablet (75 mg total) by mouth at bedtime. 05/08/16  Yes Shawnee Knapp, MD  amLODipine (NORVASC) 5 MG tablet Take 1 tablet (5 mg total) by mouth daily. 05/08/16  Yes Shawnee Knapp, MD  azithromycin (ZITHROMAX) 500 MG tablet Take 1 tablet (500 mg total) by mouth daily. 08/07/16  Yes Shawnee Knapp, MD  butalbital-acetaminophen-caffeine (FIORICET) [phone] MG tablet Take 1-2 tablets by mouth every 6 (six) hours as needed for headache. 05/08/16 05/08/17 Yes Shawnee Knapp, MD  Codeine Polt-Chlorphen Polt ER (TUZISTRA XR) 14.7-2.8 MG/5ML SUER Take 10 mLs by mouth every 12 (twelve) hours as needed. 08/07/16  Yes Shawnee Knapp, MD  ergocalciferol (VITAMIN D2) 50000 units capsule Take 1 capsule (50,000 Units total) by mouth once a week. 05/08/16  Yes Shawnee Knapp, MD  meloxicam (MOBIC) 15 MG tablet Take 1 tablet (15 mg total) by mouth daily. 05/08/16  Yes  Shawnee Knapp, MD  Phenylephrine-Pheniramine-DM [facility] COLD & COUGH PO) Take by mouth.   Yes Historical Provider, MD  promethazine (PHENERGAN) 25 MG tablet Take 1 tablet (25 mg total) by mouth every 8 (eight) hours as needed for nausea or vomiting (migraine headache). 05/08/16  Yes Shawnee Knapp, MD  sertraline (ZOLOFT) 50 MG tablet TAKE 1 TABLET BY MOUTH DAILY 07/22/16  Yes Shawnee Knapp, MD  traMADol (ULTRAM) 50 MG tablet Take 1-2 tablets (50-100 mg total) by mouth every 6 (six) hours as needed. 05/08/16  Yes Shawnee Knapp, MD   Social History   Social History  . Marital status: Single     Spouse name: [name]  . Number of children: 4  . Years of education: 12   Occupational History  .      [employer]   Social History Main Topics  . Smoking status: Former Smoker    Quit date: 10/08/1987  . Smokeless tobacco: Never Used  . Alcohol use 0.0 oz/week     Comment: 1 glass of wine occasionally  . Drug use: No  . Sexual activity: Not on file   Other Topics Concern  . Not on file   Social History Narrative   Pt lives at home with her husband, daughter, and 2 grandchildren.    She has four children    Husband needs high-level of her care due to h/o brain tumor   Drives a city bus   [place] working outside in her yard and gardening.   drinks one cup of coffee daily.    Review of Systems  Gastrointestinal: Negative for diarrhea.  Musculoskeletal: Positive for back pain and myalgias. Negative for gait problem.  Neurological: Positive for weakness and numbness. Negative for tremors.    Objective:   Physical Exam  Constitutional: She is oriented to person, place, and time. She appears well-developed and well-nourished. No distress.  HENT:  Head: Normocephalic and atraumatic.  Eyes: Conjunctivae and EOM are normal.  Neck: Neck supple.  Cardiovascular: Normal rate.   Pulmonary/Chest: Effort normal.  Musculoskeletal: Normal range of motion.  No point tenderness over spinous process or paraspinal muscles. low lumbar sacral muscle spasms.  Pain of left SI joint. Positive tenderness over left great trochanter. Positive straight leg raise. 5/5 strength on right. 5/5 left plantar flexion. 4/5 left dorsal flexion and hamstring. 3/5 left quad strength   Neurological: She is alert and oriented to person, place, and time.  Reflex Scores:      Patellar reflexes are 2+ on the right side.      Achilles reflexes are 3+ on the right side. Unable to illicit left patellar and achilles.   Skin: Skin is warm and dry.  Psychiatric: She has a normal mood and affect. Her behavior is normal.  Nursing  note and vitals reviewed.   Vitals:   09/20/16 1812  BP: (!) 148/92  Pulse: 71  Temp: 98 F (36.7 C)  TempSrc: Oral  SpO2: 97%  Weight: 161 lb 6.4 oz (73.2 kg)  Height: 5' 4.5" (1.638 m)   Assessment & Plan:   1. Sciatic neuropathy, left - pt with signs of Left radiculopathy with positive straight leg raise and weakness in left lower ext. Concern for nerve impingement from DDD so start pred taper. Can use prn hydrocodone rather than her chronic prn tylenol #3 or tramadol for this worse acute pain. Stop mobic while on pred.  If no improvement with pred taper or at any time noting worsening  parasthesias or weakness, will need to proceed with lumbar MRI. Recheck in 6 wks at latest.  2. Abnormal TSH - all labs normalized, follow clinically, recheck annually  3. Leukopenia, unspecified type - recheck  4. Vitamin D deficiency   5. Myalgia - all prior cpk normal but has been 6 mos and cont to be a major complaint, recheck.  Suspect this is due to fibromyalgia  6. Trochanteric bursitis of left hip - hopefully will improve with oral pred - I think that this is likely a separate issue but certainly compounding her left leg pain so consider cortisone injection if sxs persist.  7. ADDENDUM 09/24/16:   Neutropenia, unspecified type ([place]) - wbc nml in 2013, has been gradually decreasing since but always of such [name] degree that never seemed clinically sig but today ANC<900 with clear steady slow decrease of total wbc count over the past 3-4 yrs.  Refer to hematology for further eval. Have pt stop prednisone - already immunosuppressed.  Per UTD review: TCAs and NSAIDS can cause neutropenia so will have pt STOP elavil and meloxicam. Do not use otc nsaids or H2 blockers. Also freq caused by antibiotics including macrolides. Pt was treated with zpack 6 wks prior.  If any fever, needs emergent clinical eval but we do need to try to help pt avoid any other unnecessary antibiotics (do NOT call anything in) or  infectious exposures DO NOT LET HER SIT IN OUR WAITING ROOM - NEEDS TO BE BROUGHT BACK IMMED. CAN WAIT IN A MORE ISOLATED SPACE SUCH AS EMPLOYEE LOUNGE OR HER CAR IF NECESSARY. Pt works as a city Associate Professor, do not touch face, consider wearing mask prn, ok to write out of work if pt prefers. Needs to get flu shot asap. Would also rec pneumovax-23.   CBC Latest Ref Rng & Units 09/20/2016 05/08/2016 02/01/2016  WBC 3.4 - 10.8 x10E3/uL 2.5(LL) 2.8(L) 2.9(L)  Hemoglobin 11.7 - 15.5 g/dL - 12.7 12.4  Hematocrit 34.0 - 46.6 % 37.5 38.0 37.1  Platelets 150 - 379 x10E3/uL 291 241 250    Orders Placed This Encounter  Procedures  . Thyroid Panel With TSH  . CBC with Differential/Platelet  . VITAMIN D 25 Hydroxy (Vit-D Deficiency, Fractures)  . CK  . Ambulatory referral to Hematology    Referral Priority:   Routine    Referral Type:   Consultation    Referral Reason:   Specialty Services Required    Requested Specialty:   Oncology    Number of Visits Requested:   1  . Care order/instruction:    AVS printed - let patient go!    Meds ordered this encounter  Medications  . predniSONE (DELTASONE) 20 MG tablet    Sig: Take 3 tabs qd x 3d, then 2 tabs qd x 3d then 1 tab qd x 3d.    Dispense:  18 tablet    Refill:  0  . HYDROcodone-acetaminophen (NORCO/VICODIN) 5-325 MG tablet    Sig: Take 1-2 tablets by mouth every 4 (four) hours as needed for moderate pain.    Dispense:  90 tablet    Refill:  0   Over 40 min spent in face-to-face evaluation of and consultation with patient and coordination of care.  Over 50% of this time was spent counseling this patient.  I personally performed the services described in this documentation, which was scribed in my presence. The recorded information has been reviewed and considered, and addended by me  as needed.   Diane [name], M.D.  [facility] [address] [place], [place] [address] [phone]  phone [phone] fax  09/24/16 10:29 PM

## 2016-09-20 NOTE — Patient Instructions (Addendum)
Please recheck with me in 6 weeks at the very latest - if you are better. If you are not improving at all within 2 weeks or at any time getting worse, please call or come in. Do not take the tylenol #3 or the tramadol while you are taking the hydrocodone. Do not take the meloxicam while you are taking prednisone.   IF you received an x-ray today, you will receive an invoice from [facility]. Please contact [facility] at [phone] with questions or concerns regarding your invoice.   IF you received labwork today, you will receive an invoice from [place] [facility]. Please contact [facility] at [phone] with questions or concerns regarding your invoice.   Our billing staff will not be able to assist you with questions regarding bills from these companies.  You will be contacted with the lab results as soon as they are available. The fastest way to get your results is to activate your My Chart account. Instructions are located on the last page of this paperwork. If you have not heard from [place] regarding the results in 2 weeks, please contact this office.     Sciatica Sciatica is pain, numbness, weakness, or tingling along the path of the sciatic nerve. The sciatic nerve starts in the lower back and runs down the back of each leg. The nerve controls the muscles in the lower leg and in the back of the knee. It also provides feeling (sensation) to the back of the thigh, the lower leg, and the sole of the foot. Sciatica is a symptom of another medical condition that pinches or puts pressure on the sciatic nerve. Generally, sciatica only affects one side of the body. Sciatica usually goes away on its own or with treatment. In some cases, sciatica may keep coming back (recur). What are the causes? This condition is caused by pressure on the sciatic nerve, or pinching of the sciatic nerve. This may be the result of:  A disk in between the bones of the spine (vertebrae) bulging out too far  (herniated disk).  Age-related changes in the spinal disks (degenerative disk disease).  A pain disorder that affects a muscle in the buttock (piriformis syndrome).  Extra bone growth (bone spur) near the sciatic nerve.  An injury or break (fracture) of the pelvis.  Pregnancy.  Tumor (rare). What increases the risk? The following factors may make you more likely to develop this condition:  Playing sports that place pressure or stress on the spine, such as football or weight lifting.  Having poor strength and flexibility.  A history of back injury.  A history of back surgery.  Sitting for long periods of time.  Doing activities that involve repetitive bending or lifting.  Obesity. What are the signs or symptoms? Symptoms can vary from mild to very severe, and they may include:  Any of these problems in the lower back, leg, hip, or buttock:  Mild tingling or dull aches.  Burning sensations.  Sharp pains.  Numbness in the back of the calf or the sole of the foot.  Leg weakness.  Severe back pain that makes movement difficult. These symptoms may get worse when you cough, sneeze, or laugh, or when you sit or stand for long periods of time. Being overweight may also make symptoms worse. In some cases, symptoms may recur over time. How is this diagnosed? This condition may be diagnosed based on:  Your symptoms.  A physical exam. Your health care provider may ask you to do  certain movements to check whether those movements trigger your symptoms.  You may have tests, including:  Blood tests.  X-rays.  MRI.  CT scan. How is this treated? In many cases, this condition improves on its own, without any treatment. However, treatment may include:  Reducing or modifying physical activity during periods of pain.  Exercising and stretching to strengthen your abdomen and improve the flexibility of your spine.  Icing and applying heat to the affected  area.  Medicines that help:  To relieve pain and swelling.  To relax your muscles.  Injections of medicines that help to relieve pain, irritation, and inflammation around the sciatic nerve (steroids).  Surgery. Follow these instructions at home: Medicines  Take over-the-counter and prescription medicines only as told by your health care provider.  Do not drive or operate heavy machinery while taking prescription pain medicine. Managing pain  If directed, apply ice to the affected area.  Put ice in a plastic bag.  Place a towel between your skin and the bag.  Leave the ice on for 20 minutes, 2-3 times a day.  After icing, apply heat to the affected area before you exercise or as often as told by your health care provider. Use the heat source that your health care provider recommends, such as a moist heat pack or a heating pad.  Place a towel between your skin and the heat source.  Leave the heat on for 20-30 minutes.  Remove the heat if your skin turns bright red. This is especially important if you are unable to feel pain, heat, or cold. You may have a greater risk of getting burned. Activity  Return to your normal activities as told by your health care provider. Ask your health care provider what activities are safe for you.  Avoid activities that make your symptoms worse.  Take brief periods of rest throughout the day. Resting in a lying or standing position is usually better than sitting to rest.  When you rest for longer periods, mix in some mild activity or stretching between periods of rest. This will help to prevent stiffness and pain.  Avoid sitting for long periods of time without moving. Get up and move around at least one time each hour.  Exercise and stretch regularly, as told by your health care provider.  Do not lift anything that is heavier than 10 lb (4.5 kg) while you have symptoms of sciatica. When you do not have symptoms, you should still avoid  heavy lifting, especially repetitive heavy lifting.  When you lift objects, always use proper lifting technique, which includes:  Bending your knees.  Keeping the load close to your body.  Avoiding twisting. General instructions  Use good posture.  Avoid leaning forward while sitting.  Avoid hunching over while standing.  Maintain a healthy weight. Excess weight puts extra stress on your back and makes it difficult to maintain good posture.  Wear supportive, comfortable shoes. Avoid wearing high heels.  Avoid sleeping on a mattress that is too soft or too hard. A mattress that is firm enough to support your back when you sleep may help to reduce your pain.  Keep all follow-up visits as told by your health care provider. This is important. Contact a health care provider if:  You have pain that wakes you up when you are sleeping.  You have pain that gets worse when you lie down.  Your pain is worse than you have experienced in the past.  Your pain lasts  longer than 4 weeks.  You experience unexplained weight loss. Get help right away if:  You lose control of your bowel or bladder (incontinence).  You have:  Weakness in your lower back, pelvis, buttocks, or legs that gets worse.  Redness or swelling of your back.  A burning sensation when you urinate. This information is not intended to replace advice given to you by your health care provider. Make sure you discuss any questions you have with your health care provider. Document Released: 09/17/2001 Document Revised: 02/27/2016 Document Reviewed: 06/02/2015 Elsevier Interactive Patient Education  2017 [employer].

## 2016-09-22 LAB — THYROID PANEL WITH TSH
FREE THYROXINE INDEX: 1.8 (ref 1.2–4.9)
T3 UPTAKE RATIO: 25 % (ref 24–39)
T4 TOTAL: 7.3 ug/dL (ref 4.5–12.0)
TSH: 1.4 u[IU]/mL (ref 0.450–4.500)

## 2016-09-23 LAB — CBC WITH DIFFERENTIAL/PLATELET
Basophils Absolute: 0 10*3/uL (ref 0.0–0.2)
Basos: 1 %
EOS (ABSOLUTE): 0.1 10*3/uL (ref 0.0–0.4)
EOS: 3 %
HEMOGLOBIN: 13 g/dL (ref 11.1–15.9)
Hematocrit: 37.5 % (ref 34.0–46.6)
IMMATURE GRANULOCYTES: 0 %
Immature Grans (Abs): 0 10*3/uL (ref 0.0–0.1)
LYMPHS ABS: 1.3 10*3/uL (ref 0.7–3.1)
Lymphs: 51 %
MCH: 28.7 pg (ref 26.6–33.0)
MCHC: 34.7 g/dL (ref 31.5–35.7)
MCV: 83 fL (ref 79–97)
MONOS ABS: 0.2 10*3/uL (ref 0.1–0.9)
Monocytes: 8 %
NEUTROS PCT: 37 %
Neutrophils Absolute: 0.9 10*3/uL — ABNORMAL LOW (ref 1.4–7.0)
Platelets: 291 10*3/uL (ref 150–379)
RBC: 4.53 x10E6/uL (ref 3.77–5.28)
RDW: 12.6 % (ref 12.3–15.4)
WBC: 2.5 10*3/uL — AB (ref 3.4–10.8)

## 2016-09-23 LAB — CK: CK TOTAL: 156 U/L (ref 24–173)

## 2016-09-23 LAB — VITAMIN D 25 HYDROXY (VIT D DEFICIENCY, FRACTURES): VIT D 25 HYDROXY: 31.9 ng/mL (ref 30.0–100.0)

## 2016-09-24 ENCOUNTER — Telehealth: Payer: Self-pay | Admitting: Family Medicine

## 2016-09-24 NOTE — Telephone Encounter (Addendum)
Received [facility] from [facility] regarding critical low on WBCs of 2.5 with ANC of <900 today 12/19. Was drawn 12/15.  They received the specimen on 12/16, reported the results 12/18 13:37 but did not notify anyone, no phone calls.  Received fax from [place] 09/24/16 at 12:22 pm. . .    This seems excessively slow for a critical value.  Perhaps we can discuss this and similar incidences with our [place]?  Thanks.  Fortunately our office identified critical value fax and alerted me immediately. I sent message to lab pool to call pt and placed hematology referral.  However, reviewing her chart I see that I had placed her on prednisone at our visit.  Pt needs to stop this medication immediately but it will suppress her immune system which might already not be working to well.  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  Recommend meticulous hand washing, if feeling ill with URI sxs at all, she should come in to be seen immed. She should NOT wait in our waiting room - would need to be roomed immed or seen at [facility].  Consider wearing mask while out in public and be careful not to touch her face at all without hand hygiene prior.   If she gets a fever (temp >101 at any time or temp >100.4 for over an hour), she needs to go to the ER and tell them I sent her when she checks in.  Many antibiotics could also make this worse so def needs clinical eval if ill - do NOT call in any antibiotics/meds for this pt.  Also recommend she stop the elavil (amitriptyline) and for now the meloxicam. It is also possible the the zpack (azithromycin) antibiotic she was placed on 6 wks ago temporarily made this worse.  If it is from one of the the medications, then her blood counts should gradually bounce back as she is off of it in about 1-3 wks.   I recommend that she does use any other otc pain medication other than tylenol/acetaminophen - so no aleve,  ibuprofen, motrin, advil, etc. Also stay away from famotidine (pepcid) or ranitidine (zantac) - these are both otc acid reflux medications - for now.  Needs to get flu shot ASAP. Should likely get pneumovax-23 as well.  I would like pt to recheck with me Fri 12/29 or Sat 12/30 or can sched with one of my colleagues at [facility] earlier in the week.

## 2016-09-25 ENCOUNTER — Telehealth: Payer: Self-pay | Admitting: Emergency Medicine

## 2016-09-25 NOTE — Telephone Encounter (Signed)
Left message to ask for Diane [name] when she return call

## 2016-09-25 NOTE — Telephone Encounter (Signed)
Left a message to return call.  

## 2016-09-26 ENCOUNTER — Emergency Department (HOSPITAL_COMMUNITY)
Admission: EM | Admit: 2016-09-26 | Discharge: 2016-09-26 | Disposition: A | Discharge status: Home / Self Care | Payer: BLUE CROSS/BLUE SHIELD | Attending: Emergency Medicine | Admitting: Emergency Medicine

## 2016-09-26 ENCOUNTER — Encounter (HOSPITAL_COMMUNITY): Payer: Self-pay | Admitting: Emergency Medicine

## 2016-09-26 ENCOUNTER — Emergency Department (HOSPITAL_COMMUNITY): Payer: BLUE CROSS/BLUE SHIELD

## 2016-09-26 DIAGNOSIS — Z87891 Personal history of nicotine dependence: Secondary | ICD-10-CM | POA: Insufficient documentation

## 2016-09-26 DIAGNOSIS — N132 Hydronephrosis with renal and ureteral calculous obstruction: Secondary | ICD-10-CM | POA: Diagnosis not present

## 2016-09-26 DIAGNOSIS — I10 Essential (primary) hypertension: Secondary | ICD-10-CM | POA: Insufficient documentation

## 2016-09-26 DIAGNOSIS — R112 Nausea with vomiting, unspecified: Secondary | ICD-10-CM | POA: Diagnosis present

## 2016-09-26 DIAGNOSIS — R935 Abnormal findings on diagnostic imaging of other abdominal regions, including retroperitoneum: Secondary | ICD-10-CM | POA: Insufficient documentation

## 2016-09-26 DIAGNOSIS — N1330 Unspecified hydronephrosis: Secondary | ICD-10-CM | POA: Diagnosis not present

## 2016-09-26 DIAGNOSIS — N201 Calculus of ureter: Secondary | ICD-10-CM | POA: Diagnosis not present

## 2016-09-26 LAB — CBC
HEMATOCRIT: 40.3 % (ref 36.0–46.0)
HEMOGLOBIN: 13.8 g/dL (ref 12.0–15.0)
MCH: 28.8 pg (ref 26.0–34.0)
MCHC: 34.2 g/dL (ref 30.0–36.0)
MCV: 84 fL (ref 78.0–100.0)
Platelets: 219 10*3/uL (ref 150–400)
RBC: 4.8 MIL/uL (ref 3.87–5.11)
RDW: 12.8 % (ref 11.5–15.5)
WBC: 6.1 10*3/uL (ref 4.0–10.5)

## 2016-09-26 LAB — COMPREHENSIVE METABOLIC PANEL
ALT: 20 U/L (ref 14–54)
ANION GAP: 12 (ref 5–15)
AST: 25 U/L (ref 15–41)
Albumin: 4.6 g/dL (ref 3.5–5.0)
Alkaline Phosphatase: 79 U/L (ref 38–126)
BUN: 19 mg/dL (ref 6–20)
CHLORIDE: 106 mmol/L (ref 101–111)
CO2: 24 mmol/L (ref 22–32)
Calcium: 9.2 mg/dL (ref 8.9–10.3)
Creatinine, Ser: 0.72 mg/dL (ref 0.44–1.00)
GFR calc non Af Amer: >60 mL/min (ref >60)
Glucose, Bld: 131 mg/dL — ABNORMAL HIGH (ref 65–99)
Potassium: 3.9 mmol/L (ref 3.5–5.1)
SODIUM: 142 mmol/L (ref 135–145)
Total Bilirubin: 0.5 mg/dL (ref 0.3–1.2)
Total Protein: 7.8 g/dL (ref 6.5–8.1)

## 2016-09-26 LAB — URINALYSIS, ROUTINE W REFLEX MICROSCOPIC
BILIRUBIN URINE: NEGATIVE
Glucose, UA: NEGATIVE mg/dL
KETONES UR: NEGATIVE mg/dL
Nitrite: NEGATIVE
PH: 6 (ref 5.0–8.0)
Protein, ur: NEGATIVE mg/dL
Specific Gravity, Urine: 1.018 (ref 1.005–1.030)

## 2016-09-26 LAB — LIPASE, BLOOD: LIPASE: 20 U/L (ref 11–51)

## 2016-09-26 MED ORDER — ONDANSETRON 4 MG PO TBDP: 4.0000 mg | ORAL_TABLET | Freq: Three times a day (TID) | ORAL | 0 refills | Status: DC | PRN

## 2016-09-26 MED ORDER — FENTANYL CITRATE (PF) 100 MCG/2ML IJ SOLN
100.0000 ug | Freq: Once | INTRAMUSCULAR | Status: AC
  Administered 2016-09-26: 100 ug via INTRAVENOUS
  Filled 2016-09-26: qty 2

## 2016-09-26 MED ORDER — DIPHENHYDRAMINE HCL 50 MG/ML IJ SOLN
25.0000 mg | Freq: Once | INTRAMUSCULAR | Status: AC
  Administered 2016-09-26: 25 mg via INTRAVENOUS
  Filled 2016-09-26: qty 1

## 2016-09-26 MED ORDER — TAMSULOSIN HCL 0.4 MG PO CAPS: 0.4000 mg | ORAL_CAPSULE | Freq: Every day | ORAL | 0 refills | Status: DC

## 2016-09-26 MED ORDER — HYDROMORPHONE HCL 2 MG/ML IJ SOLN
1.0000 mg | Freq: Once | INTRAMUSCULAR | Status: AC
  Administered 2016-09-26: 1 mg via INTRAVENOUS
  Filled 2016-09-26: qty 1

## 2016-09-26 MED ORDER — IOPAMIDOL (ISOVUE-300) INJECTION 61%
INTRAVENOUS | Status: AC
  Filled 2016-09-26: qty 100

## 2016-09-26 MED ORDER — ONDANSETRON HCL 4 MG/2ML IJ SOLN
4.0000 mg | Freq: Once | INTRAMUSCULAR | Status: AC
  Administered 2016-09-26: 4 mg via INTRAVENOUS
  Filled 2016-09-26: qty 2

## 2016-09-26 MED ORDER — SODIUM CHLORIDE 0.9 % IV BOLUS (SEPSIS)
500.0000 mL | Freq: Once | INTRAVENOUS | Status: AC
  Administered 2016-09-26: 500 mL via INTRAVENOUS

## 2016-09-26 MED ORDER — IOPAMIDOL (ISOVUE-300) INJECTION 61%
100.0000 mL | Freq: Once | INTRAVENOUS | Status: AC | PRN
  Administered 2016-09-26: 100 mL via INTRAVENOUS

## 2016-09-26 MED ORDER — MECLIZINE HCL 25 MG PO TABS: 25.0000 mg | ORAL_TABLET | Freq: Three times a day (TID) | ORAL | 0 refills | Status: DC | PRN

## 2016-09-26 MED ORDER — HYDROCODONE-ACETAMINOPHEN 5-325 MG PO TABS: 1.0000 | ORAL_TABLET | ORAL | 0 refills | Status: DC | PRN

## 2016-09-26 NOTE — ED Provider Notes (Signed)
[facility] DEPT Provider Note   CSN: [id number] Arrival date & time: 09/26/16  1837     History   Chief Complaint Chief Complaint  Patient presents with  . Abdominal Pain    HPI Diane [name] is a 60 y.o. female.  HPI  60 year old female presents with left-sided abdominal pain since around 4:00. She states she's been nauseated all day and when she got home she started vomiting and having abdominal pain. She states a couple time she saw small specks of blood but this was after heaving a lot. She has not had any diarrhea. She has been having left-sided body pain, mostly in her hip for the last several weeks. However she has not had any abdominal pain until today. It also hurts in her left lower back. Denies any urinary symptoms including dysuria or hematuria. No diarrhea or constipation. No chest pain or shortness of breath. No fevers. Pain is currently severe. Of note she was also told today on a phone call that her white blood cell count was low from labs taken last week by her PCP. She has been referred to a hematologist.  Past Medical History:  Diagnosis Date  . Headache(784.0)   . Hypertension     Patient Active Problem List   Diagnosis Date Noted  . Fibromyalgia 02/01/2016  . Arthritis, senescent 02/01/2016  . Vitamin D deficiency 02/01/2016  . Depression 08/31/2015  . Leukopenia 08/31/2015  . Polypharmacy 08/31/2015  . Migraine headache without aura 12/31/2012  . Hypertension 12/31/2012  . Insomnia 12/31/2012    History reviewed. No pertinent surgical history.  OB History    No data available       Home Medications    Prior to Admission medications   Medication Sig Start Date End Date Taking? Authorizing Provider  acetaminophen-codeine (TYLENOL #3) 300-30 MG tablet Take 1 tablet by mouth every 4 (four) hours as needed for moderate pain. 05/08/16  Yes Shawnee Knapp, MD  amitriptyline (ELAVIL) 75 MG tablet TAKE 1 TABLET (75 MG TOTAL) BY MOUTH AT BEDTIME.  08/15/16  Yes Historical Provider, MD  amLODipine (NORVASC) 5 MG tablet Take 1 tablet (5 mg total) by mouth daily. 05/08/16  Yes Shawnee Knapp, MD  butalbital-acetaminophen-caffeine (FIORICET) [phone] MG tablet Take 1-2 tablets by mouth every 6 (six) hours as needed for headache. 05/08/16 05/08/17 Yes Shawnee Knapp, MD  Codeine Polt-Chlorphen Polt ER (TUZISTRA XR) 14.7-2.8 MG/5ML SUER Take 10 mLs by mouth every 12 (twelve) hours as needed. 08/07/16  Yes Shawnee Knapp, MD  ergocalciferol (VITAMIN D2) 50000 units capsule Take 1 capsule (50,000 Units total) by mouth once a week. 05/08/16  Yes Shawnee Knapp, MD  HYDROcodone-acetaminophen (NORCO/VICODIN) 5-325 MG tablet Take 1-2 tablets by mouth every 4 (four) hours as needed for moderate pain. 09/20/16  Yes Shawnee Knapp, MD  predniSONE (DELTASONE) 20 MG tablet TAKE 3 TABS DAILY X 3DAYS, THEN 2 TABS DAILY X 3DAYS THEN 1 TAB DAILY X 3DAYS 09/20/16  Yes Historical Provider, MD  promethazine (PHENERGAN) 25 MG tablet Take 1 tablet (25 mg total) by mouth every 8 (eight) hours as needed for nausea or vomiting (migraine headache). 05/08/16  Yes Shawnee Knapp, MD  sertraline (ZOLOFT) 50 MG tablet TAKE 1 TABLET BY MOUTH DAILY 07/22/16  Yes Shawnee Knapp, MD  traMADol (ULTRAM) 50 MG tablet Take 1-2 tablets (50-100 mg total) by mouth every 6 (six) hours as needed. 05/08/16  Yes Shawnee Knapp, MD  HYDROcodone-acetaminophen [facility]) [phone]  MG tablet Take 1-2 tablets by mouth every 4 (four) hours as needed for severe pain. 09/26/16   Sherwood Gambler, MD  ondansetron (ZOFRAN ODT) 4 MG disintegrating tablet Take 1 tablet (4 mg total) by mouth every 8 (eight) hours as needed for nausea or vomiting. 09/26/16   Sherwood Gambler, MD  tamsulosin (FLOMAX) 0.4 MG CAPS capsule Take 1 capsule (0.4 mg total) by mouth daily. 09/26/16   Sherwood Gambler, MD    Family History Family History  Problem Relation Age of Onset  . Healthy Mother     Social History Social History  Substance Use Topics  . Smoking status:  Former Smoker    Quit date: 10/08/1987  . Smokeless tobacco: Never Used  . Alcohol use 0.0 oz/week     Comment: 1 glass of wine occasionally     Allergies   Topamax [topiramate]   Review of Systems Review of Systems  Constitutional: Negative for fever.  Respiratory: Negative for shortness of breath.   Cardiovascular: Negative for chest pain.  Gastrointestinal: Positive for abdominal pain, nausea and vomiting.  Genitourinary: Negative for dysuria and hematuria.  Musculoskeletal: Positive for back pain.  All other systems reviewed and are negative.    Physical Exam Updated Vital Signs BP 125/62 (BP Location: Right Arm)   Pulse 64   Temp 97.7 F (36.5 C) (Oral)   Resp 17   Ht 5\' 3"  (1.6 m)   Wt 160 lb (72.6 kg)   SpO2 94%   BMI 28.34 kg/m   Physical Exam  Constitutional: She is oriented to person, place, and time. She appears well-developed and well-nourished. She appears distressed (in pain).  HENT:  Head: Normocephalic and atraumatic.  Right Ear: External ear normal.  Left Ear: External ear normal.  Nose: Nose normal.  Eyes: Right eye exhibits no discharge. Left eye exhibits no discharge.  Cardiovascular: Normal rate, regular rhythm and normal heart sounds.   Pulmonary/Chest: Effort normal and breath sounds normal.  Abdominal: Soft. There is tenderness in the left upper quadrant and left lower quadrant.  Multiple abdominal scars  Neurological: She is alert and oriented to person, place, and time.  Skin: Skin is warm and dry. She is not diaphoretic.  Nursing note and vitals reviewed.    ED Treatments / Results  Labs (all labs ordered are listed, but only abnormal results are displayed) Labs Reviewed  COMPREHENSIVE METABOLIC PANEL - Abnormal; Notable for the following:       Result Value   Glucose, Bld 131 (*)    All other components within normal limits  URINALYSIS, ROUTINE W REFLEX MICROSCOPIC - Abnormal; Notable for the following:    APPearance HAZY (*)      Hgb urine dipstick LARGE (*)    Leukocytes, UA TRACE (*)    Bacteria, UA RARE (*)    Squamous Epithelial / LPF 0-5 (*)    All other components within normal limits  LIPASE, BLOOD  CBC    EKG  EKG Interpretation None       Radiology Ct Abdomen Pelvis W Contrast  Result Date: 09/26/2016 CLINICAL DATA:  Left-sided abdominal pain with nausea EXAM: CT ABDOMEN AND PELVIS WITH CONTRAST TECHNIQUE: Multidetector CT imaging of the abdomen and pelvis was performed using the standard protocol following bolus administration of intravenous contrast. CONTRAST:  169mL ISOVUE-300 IOPAMIDOL (ISOVUE-300) INJECTION 61% COMPARISON:  None. FINDINGS: Lower chest: Lung bases demonstrate no acute consolidation or pleural effusion. Normal heart size. Hepatobiliary: Moderate intra hepatic biliary dilatation. 1 cm cyst  lateral segment left hepatic lobe. Additional subcentimeter hypodense lesions within the lateral segment of the left lobe too small to further characterize. Gallbladder not visualized and is presumed surgically absent. Extrahepatic bile duct is also enlarged, measuring up to 12 mm. Pancreas: Pancreas shows no surrounding inflammation. There is mild enlargement of the pancreatic duct measuring up to 4 mm. Spleen: Normal in size without focal abnormality. Adrenals/Urinary Tract: 2 cm hypodense nodule left adrenal gland. Right adrenal gland is within normal limits. Mild hydronephrosis and hydroureter on the left, this is secondary to a 3 x 4 mm stone within the proximal left ureter at the junction of the first and middle thirds. Urinary bladder unremarkable. Stomach/Bowel: Stomach is nonenlarged. No dilated small bowel. Large amount of stool in the colon. The appendix is visualized and is normal. Vascular/Lymphatic: Aortic atherosclerosis. No enlarged abdominal or pelvic lymph nodes. Reproductive: Enlarged appearing uterus for age with lobulated fundal contour and calcifications, suspected to be secondary to  uterine fibroids. Other: No free air or free fluid. Musculoskeletal: Questionable old fracture deformity of the right inferior ramus. Degenerative changes. No acute or suspicious bone lesion. IMPRESSION: 1. Mild left hydronephrosis and proximal hydroureter secondary to a 3 x 4 mm stone within the proximal left ureter at the junction of the first and middle thirds. 2. Moderate intra hepatic and extrahepatic biliary dilatation with surgical absence of the gallbladder. Pancreatic duct is also mildly dilated. Recommend correlation with laboratory values. If ductal obstruction is suspected, further evaluation with MR or ERCP may be obtained. 3. 2 cm left adrenal gland nodule. Nonemergent adrenal CT may be obtained for further evaluation. 4. Enlarged uterus with lobulated fundal contour presumably due to fibroids. Electronically Signed   By: Donavan Foil M.D.   On: 09/26/2016 22:01    Procedures Procedures (including critical care time)  Medications Ordered in ED Medications  iopamidol (ISOVUE-300) 61 % injection (not administered)  ondansetron (ZOFRAN) injection 4 mg (4 mg Intravenous Given 09/26/16 2018)  sodium chloride 0.9 % bolus 500 mL (0 mLs Intravenous Stopped 09/26/16 2110)  HYDROmorphone (DILAUDID) injection 1 mg (1 mg Intravenous Given 09/26/16 2018)  fentaNYL (SUBLIMAZE) injection 100 mcg (100 mcg Intravenous Given 09/26/16 2109)  diphenhydrAMINE (BENADRYL) injection 25 mg (25 mg Intravenous Given 09/26/16 2110)  iopamidol (ISOVUE-300) 61 % injection 100 mL (100 mLs Intravenous Contrast Given 09/26/16 2134)     Initial Impression / Assessment and Plan / ED Course  I have reviewed the triage vital signs and the nursing notes.  Pertinent labs & imaging results that were available during my care of the patient were reviewed by me and considered in my medical decision making (see chart for details).  Clinical Course as of Sep 27 57  Thu Sep 26, 2016  1947 Dilaudid, zofran, fluids, CT  and labs.   [SG]    Clinical Course User Index [SG] Sherwood Gambler, MD    Given acute onset of pain and left ureteral stone seen I think this is cause of pain. LFTs benign, I think the duct dilation is post-cholecystectomy. Her pain is better controlled. Of note, she had itching and localized redness to dilaudid, resolved with benadryl. Discussed using nsaids for pain, hydrocodone for breakthrough (has had before). No signs/symptoms of infection. F/u with urology.   Final Clinical Impressions(s) / ED Diagnoses   Final diagnoses:  Left ureteral stone    New Prescriptions Discharge Medication List as of 09/26/2016 10:49 PM    START taking these medications   Details  !!  HYDROcodone-acetaminophen (NORCO) 5-325 MG tablet Take 1-2 tablets by mouth every 4 (four) hours as needed for severe pain., Starting Thu 09/26/2016, Print    ondansetron (ZOFRAN ODT) 4 MG disintegrating tablet Take 1 tablet (4 mg total) by mouth every 8 (eight) hours as needed for nausea or vomiting., Starting Thu 09/26/2016, Print    tamsulosin (FLOMAX) 0.4 MG CAPS capsule Take 1 capsule (0.4 mg total) by mouth daily., Starting Thu 09/26/2016, Print     !! - Potential duplicate medications found. Please discuss with provider.       Sherwood Gambler, MD 09/27/16 0100

## 2016-09-26 NOTE — Telephone Encounter (Signed)
Called pt and was able to speak to her. Gave her all of the info both in this message and in notes on lab results. Pt verbalized understanding of all. She agreed to call back to set up appt to re-check with Dr Brigitte Pulse 29/30th, and will expect a call from hemotologist. Dr Brigitte Pulse, Juluis Rainier

## 2016-09-26 NOTE — ED Notes (Signed)
Nurse is in the room with the patient

## 2016-09-26 NOTE — ED Notes (Signed)
Bed: WA06 Expected date:  Expected time:  Means of arrival:  Comments: Hold for triage 6

## 2016-09-26 NOTE — ED Triage Notes (Signed)
Per pt, states abdominal pain while shopping-bent over in pain-states he MD called her today and said her WBCs were low

## 2016-09-26 NOTE — ED Notes (Signed)
Patient transported to CT 

## 2016-09-26 NOTE — ED Notes (Signed)
Pt.made aware for the need of urine. 

## 2016-09-27 ENCOUNTER — Other Ambulatory Visit: Payer: Self-pay | Admitting: Family Medicine

## 2016-09-27 DIAGNOSIS — E278 Other specified disorders of adrenal gland: Secondary | ICD-10-CM

## 2016-09-27 DIAGNOSIS — E279 Disorder of adrenal gland, unspecified: Principal | ICD-10-CM

## 2016-10-04 ENCOUNTER — Ambulatory Visit (INDEPENDENT_AMBULATORY_CARE_PROVIDER_SITE_OTHER): Payer: BLUE CROSS/BLUE SHIELD | Admitting: Family Medicine

## 2016-10-04 ENCOUNTER — Telehealth: Payer: Self-pay | Admitting: Oncology

## 2016-10-04 ENCOUNTER — Encounter: Payer: Self-pay | Admitting: Oncology

## 2016-10-04 VITALS — BP 122/80 | HR 97 | Temp 98.4°F | Resp 18 | Ht 63.0 in | Wt 159.0 lb

## 2016-10-04 DIAGNOSIS — N200 Calculus of kidney: Secondary | ICD-10-CM | POA: Diagnosis not present

## 2016-10-04 DIAGNOSIS — D709 Neutropenia, unspecified: Secondary | ICD-10-CM

## 2016-10-04 DIAGNOSIS — R3129 Other microscopic hematuria: Secondary | ICD-10-CM | POA: Diagnosis not present

## 2016-10-04 DIAGNOSIS — E279 Disorder of adrenal gland, unspecified: Secondary | ICD-10-CM

## 2016-10-04 DIAGNOSIS — E278 Other specified disorders of adrenal gland: Secondary | ICD-10-CM

## 2016-10-04 DIAGNOSIS — Z23 Encounter for immunization: Secondary | ICD-10-CM

## 2016-10-04 DIAGNOSIS — G5702 Lesion of sciatic nerve, left lower limb: Secondary | ICD-10-CM

## 2016-10-04 LAB — POCT CBC
Granulocyte percent: 51 %G (ref 37–80)
HCT, POC: 38.4 % (ref 37.7–47.9)
HEMOGLOBIN: 13.3 g/dL (ref 12.2–16.2)
Lymph, poc: 1.1 (ref 0.6–3.4)
MCH: 29 pg (ref 27–31.2)
MCHC: 34.6 g/dL (ref 31.8–35.4)
MCV: 83.6 fL (ref 80–97)
MID (CBC): 0.3 (ref 0–0.9)
MPV: 6.4 fL (ref 0–99.8)
PLATELET COUNT, POC: 294 10*3/uL (ref 142–424)
POC Granulocyte: 1.4 — AB (ref 2–6.9)
POC LYMPH PERCENT: 38.9 %L (ref 10–50)
POC MID %: 10.1 %M (ref 0–12)
RBC: 4.59 M/uL (ref 4.04–5.48)
RDW, POC: 13.1 %
WBC: 2.8 10*3/uL — AB (ref 4.6–10.2)

## 2016-10-04 LAB — POCT URINALYSIS DIP (MANUAL ENTRY)
Bilirubin, UA: NEGATIVE
GLUCOSE UA: NEGATIVE
Ketones, POC UA: NEGATIVE
Leukocytes, UA: NEGATIVE
NITRITE UA: NEGATIVE
PH UA: 6
PROTEIN UA: NEGATIVE
Spec Grav, UA: 1.02
UROBILINOGEN UA: 0.2

## 2016-10-04 LAB — POC MICROSCOPIC URINALYSIS (UMFC): Mucus: ABSENT

## 2016-10-04 MED ORDER — TAMSULOSIN HCL 0.4 MG PO CAPS: 0.4000 mg | ORAL_CAPSULE | Freq: Every day | ORAL | 0 refills | Status: DC

## 2016-10-04 MED ORDER — HYDROCODONE-ACETAMINOPHEN 5-325 MG PO TABS: 1.0000 | ORAL_TABLET | ORAL | 0 refills | Status: DC | PRN

## 2016-10-04 NOTE — Progress Notes (Addendum)
By signing my name below, I, Diane [name], attest that this documentation has been prepared under the direction and in the presence of Diane Cheadle, MD.  Electronically Signed: Verlee [name], Medical Scribe. 10/04/16. 9:30 AM.  Subjective:    Patient ID: Diane [name], female    DOB: March 07, 1956, 60 y.o.   MRN: [id number]  HPI Chief Complaint  Patient presents with  . Follow-up    labwork    HPI Comments: Diane [name] is a 60 y.o. female who presents to the [facility] for follow-up. She reports feeling pain at her left inguinal that radiates to her left flank. Pt reports urinating and sleeping as she nl does as well as unchanged hot and cold flashes. She has not been drinking as much fluids. She goes back to work 10/08/16. She hasn't has an appt with hematology yet. Pt has her cervical cell froze in the past. Denies fever, chills, diaphoresis and FHx of low WBC.  White count dropped to 2.5 with an [place] of 900. I had started her on prednisone for left sciatic neuropathy on the same day of the blood draw. She reports she had failed meloxicam, tramadol, tylenol #3, elavil, and bengay. She has known DDD and has had injections in her L-spine prior. Pt was started on prednisone taper which she reports was working well, but had to stop for several days due to low ANC. Stepped up pain medication to hydrocodone. If no improvement in paresthesia or weakness at the end of January, will need MRI for lumbar. Also had pt stop elavil and meloxicam as TCAs and NASIDs can cause neutropenia. Avoid OTC NSAIDs, H2 blockers, and abx. Review hand hygiene. Left neuropathy and now left kidney stone. ED visit 1 week prior for left flank pain had CT scan mild left hydronephrosis and secondary to 3x4 mm stone in the proximal left ureter, also noted a 2cm left adrenal gland nodule.  Patient Active Problem List   Diagnosis Date Noted  . Adrenal nodule ([place]) 09/27/2016  . Fibromyalgia  02/01/2016  . Arthritis, senescent 02/01/2016  . Vitamin D deficiency 02/01/2016  . Depression 08/31/2015  . Leukopenia 08/31/2015  . Polypharmacy 08/31/2015  . Migraine headache without aura 12/31/2012  . Hypertension 12/31/2012  . Insomnia 12/31/2012   Past Medical History:  Diagnosis Date  . Headache(784.0)   . Hypertension    History reviewed. No pertinent surgical history. Allergies  Allergen Reactions  . Topamax [Topiramate] Hives  . Dilaudid [Hydromorphone Hcl] Hives   Prior to Admission medications   Medication Sig Start Date End Date Taking? Authorizing Provider  acetaminophen-codeine (TYLENOL #3) 300-30 MG tablet Take 1 tablet by mouth every 4 (four) hours as needed for moderate pain. 05/08/16  Yes Shawnee Knapp, MD  amitriptyline (ELAVIL) 75 MG tablet TAKE 1 TABLET (75 MG TOTAL) BY MOUTH AT BEDTIME. 08/15/16  Yes Historical Provider, MD  amLODipine (NORVASC) 5 MG tablet Take 1 tablet (5 mg total) by mouth daily. 05/08/16  Yes Shawnee Knapp, MD  butalbital-acetaminophen-caffeine (FIORICET) [phone] MG tablet Take 1-2 tablets by mouth every 6 (six) hours as needed for headache. 05/08/16 05/08/17 Yes Shawnee Knapp, MD  Codeine Polt-Chlorphen Polt ER (TUZISTRA XR) 14.7-2.8 MG/5ML SUER Take 10 mLs by mouth every 12 (twelve) hours as needed. 08/07/16  Yes Shawnee Knapp, MD  ergocalciferol (VITAMIN D2) 50000 units capsule Take 1 capsule (50,000 Units total) by mouth once a week. 05/08/16  Yes Shawnee Knapp, MD  HYDROcodone-acetaminophen (NORCO) 5-325 MG tablet Take 1-2 tablets by mouth every 4 (four) hours as needed for severe pain. 09/26/16  Yes Sherwood Gambler, MD  HYDROcodone-acetaminophen (NORCO/VICODIN) 5-325 MG tablet Take 1-2 tablets by mouth every 4 (four) hours as needed for moderate pain. 09/20/16  Yes Shawnee Knapp, MD  ondansetron (ZOFRAN ODT) 4 MG disintegrating tablet Take 1 tablet (4 mg total) by mouth every 8 (eight) hours as needed for nausea or vomiting. 09/26/16  Yes Sherwood Gambler, MD    predniSONE (DELTASONE) 20 MG tablet TAKE 3 TABS DAILY X 3DAYS, THEN 2 TABS DAILY X 3DAYS THEN 1 TAB DAILY X 3DAYS 09/20/16  Yes Historical Provider, MD  promethazine (PHENERGAN) 25 MG tablet Take 1 tablet (25 mg total) by mouth every 8 (eight) hours as needed for nausea or vomiting (migraine headache). 05/08/16  Yes Shawnee Knapp, MD  sertraline (ZOLOFT) 50 MG tablet TAKE 1 TABLET BY MOUTH DAILY 07/22/16  Yes Shawnee Knapp, MD  tamsulosin (FLOMAX) 0.4 MG CAPS capsule Take 1 capsule (0.4 mg total) by mouth daily. 09/26/16  Yes Sherwood Gambler, MD  traMADol (ULTRAM) 50 MG tablet Take 1-2 tablets (50-100 mg total) by mouth every 6 (six) hours as needed. 05/08/16  Yes Shawnee Knapp, MD   Social History   Social History  . Marital status: Single    Spouse name: [name]  . Number of children: 4  . Years of education: 12   Occupational History  .      [employer]   Social History Main Topics  . Smoking status: Former Smoker    Quit date: 10/08/1987  . Smokeless tobacco: Never Used  . Alcohol use 0.0 oz/week     Comment: 1 glass of wine occasionally  . Drug use: No  . Sexual activity: Not on file   Other Topics Concern  . Not on file   Social History Narrative   Pt lives at home with her husband, daughter, and 2 grandchildren.    She has four children    Husband needs high-level of her care due to h/o brain tumor   Drives a city bus   [place] working outside in her yard and gardening.   drinks one cup of coffee daily.    Review of Systems  Constitutional: Negative for chills, diaphoresis and fever.  Gastrointestinal: Positive for abdominal pain.  Endocrine: Positive for cold intolerance (chronic and unchanged) and heat intolerance (chronic and unchanged).  Genitourinary: Positive for flank pain.   Objective:  Physical Exam  Constitutional: She appears well-developed and well-nourished. No distress.  HENT:  Head: Normocephalic and atraumatic.  Eyes: Conjunctivae are normal.  Neck: Neck supple.   Cardiovascular: Normal rate.   Pulmonary/Chest: Effort normal.  Neurological: She is alert.  Skin: Skin is warm and dry.  Psychiatric: She has a normal mood and affect. Her behavior is normal.  Nursing note and vitals reviewed.  BP 122/80 (BP Location: Right Arm, Patient Position: Sitting, Cuff Size: Small)   Pulse 97   Temp 98.4 F (36.9 C) (Oral)   Resp 18   Ht 5\' 3"  (1.6 m)   Wt 159 lb (72.1 kg)   SpO2 98%   BMI 28.17 kg/m    Results for orders placed or performed in visit on 10/04/16  POCT CBC  Result Value Ref Range   WBC 2.8 (A) 4.6 - 10.2 K/uL   Lymph, poc 1.1 0.6 - 3.4   POC LYMPH PERCENT 38.9 10 - 50 %L  MID (cbc) 0.3 0 - 0.9   POC MID % 10.1 0 - 12 %M   POC Granulocyte 1.4 (A) 2 - 6.9   Granulocyte percent 51.0 37 - 80 %G   RBC 4.59 4.04 - 5.48 M/uL   Hemoglobin 13.3 12.2 - 16.2 g/dL   HCT, POC 38.4 37.7 - 47.9 %   MCV 83.6 80 - 97 fL   MCH, POC 29.0 27 - 31.2 pg   MCHC 34.6 31.8 - 35.4 g/dL   RDW, POC 13.1 %   Platelet Count, POC 294 142 - 424 K/uL   MPV 6.4 0 - 99.8 fL  POCT urinalysis dipstick  Result Value Ref Range   Color, UA yellow yellow   Clarity, UA clear clear   Glucose, UA negative negative   Bilirubin, UA negative negative   Ketones, POC UA negative negative   Spec Grav, UA 1.020    Blood, UA trace-intact (A) negative   pH, UA 6.0    Protein Ur, POC negative negative   Urobilinogen, UA 0.2    Nitrite, UA Negative Negative   Leukocytes, UA Negative Negative   Assessment & Plan:   1. Neutropenia, unspecified type ([place]) - unfortunately nml wbc in ER was because she was on prednisone (which did provide sig relief for her now recurrent sciatica) but had to stop due to concern for further immunosuppression.  Needs to sched appt with heme asap as conc about low ANC <1000 she has now developed wit progressively lowering wbc over the past few yrs. No known personal or family hx. Stopped elavil and meloxicam as may exacerbate.  2. Other  microscopic hematuria   3. Need for prophylactic vaccination and inoculation against influenza - pt very resistent but ultimately agreeable. She also should receive pneumovax but   4. Left nephrolithiasis - pt still straining urine - pain persists and has not passed anything, cont flomax, push fluids, refilled hydrocodone.  If finds stone, bring in for analysis.  5. Adrenal nodule ([place]) - incidentally seen on abd/pelvic CT - 2 cm so was recommended to have non-emergent dedicated imaging, will wait sev mos to also allow for demonstration of stability as seems to be of relatively low risk for malignant neoplasm.    Orders Placed This Encounter  Procedures  . Urine culture  . Flu Vaccine QUAD 36+ mos IM  . Pathologist smear review  . CBC with Differential  . POCT CBC  . POCT urinalysis dipstick  . POCT Microscopic Urinalysis ([facility])    Meds ordered this encounter  Medications  . tamsulosin (FLOMAX) 0.4 MG CAPS capsule    Sig: Take 1 capsule (0.4 mg total) by mouth daily.    Dispense:  30 capsule    Refill:  0  . HYDROcodone-acetaminophen (NORCO/VICODIN) 5-325 MG tablet    Sig: Take 1-2 tablets by mouth every 4 (four) hours as needed for moderate pain.    Dispense:  90 tablet    Refill:  0   Over 40 min spent in face-to-face evaluation of and consultation with patient and coordination of care.  Over 50% of this time was spent counseling this patient.  I personally performed the services described in this documentation, which was scribed in my presence. The recorded information has been reviewed and considered, and addended by me as needed.   Diane [name], M.D.  [facility] [address] [place], [place] [address] [phone] phone [phone] fax  10/07/16 6:58 PM

## 2016-10-04 NOTE — Patient Instructions (Addendum)
Call [facility] (it is the same as the [facility]) to schedule your appointment with a hematology to make sure we are not missing any dangerous cause of the lowing white blood cells. Phone: [phone]  In the meantime, Do not use any other otc pain medication other than tylenol/acetaminophen - so no aleve, ibuprofen, motrin, advil, etc. We will also have you stay off of the amitriptyline in the prednisone. If the weakness or numbness in your left leg is not improving or worsening, we will will need to proceed with a lumbar MRI to make sure there is no compression of the spinal cord or nerve that could result in permanent damage.  Make sure you are drinking as much water as she possibly can. The more water he drinks more likely you would be to pass a kidney stone quickly and with decreased pain. Strain your urine when your home in hopes of catching the stone. If you catch it put it in a container and drop it off at our office so we can send off for analysis. This can allow you to make dietary adjustments in hopes of avoiding formation of future kidney stones. The most important way to avoid making additional kidney stones will be to drink plenty of water.    IF you received an x-ray today, you will receive an invoice from [facility]. Please contact [facility] at [phone] with questions or concerns regarding your invoice.   IF you received labwork today, you will receive an invoice from [facility]. Please contact [facility] at [phone] with questions or concerns regarding your invoice.   Our billing staff will not be able to assist you with questions regarding bills from these companies.  You will be contacted with the lab results as soon as they are available. The fastest way to get your results is to activate your My Chart account. Instructions are located on the last page of this paperwork. If you have not heard from [place] regarding the results in  2 weeks, please contact this office.     Neutropenia Introduction Neutropenia is a condition that occurs when you have a lower-than-normal level of a type of white blood cell (neutrophil) in your body. Neutrophils are made in the spongy center of large bones (bone marrow) and they fight infections. Neutrophils are your body's main defense against bacterial and fungal infections. The fewer neutrophils you have and the longer your body remains without them, the greater your risk of getting a severe infection. What are the causes? This condition can occur if your body uses up or destroys neutrophils faster than your bone marrow can make them. This problem may happen because of:  Bacterial or fungal infection.  Allergic disorders.  Reactions to some medicines.  Autoimmune disease.  An enlarged spleen. This condition can also occur if your bone marrow does not produce enough neutrophils. This problem may be caused by:  Cancer.  Cancer treatments, such as radiation or chemotherapy.  Viral infections.  Medicines, such as phenytoin.  Vitamin B12 deficiency.  Diseases of the bone marrow.  Environmental toxins, such as insecticides. What are the signs or symptoms? This condition does not usually cause symptoms. If symptoms are present, they are usually caused by an underlying infection. Symptoms of an infection may include:  Fever.  Chills.  Swollen glands.  Oral or anal ulcers.  Cough and shortness of breath.  Rash.  Skin infection.  Fatigue. How is this diagnosed? Your health care provider may  suspect neutropenia if you have:  A condition that may cause neutropenia.  Symptoms of infection, especially fever.  Frequent and unusual infections. You will have a medical history and physical exam. Tests will also be done, such as:  A complete blood count (CBC).  A procedure to collect a sample of bone marrow for examination (bone marrow biopsy).  A chest  X-ray.  A urine culture.  A blood culture. How is this treated? Treatment depends on the underlying cause and severity of your condition. Mild neutropenia may not require treatment. Treatment may include medicines, such as:  Antibiotic medicine given through an IV tube.  Antiviral medicines.  Antifungal medicines.  A medicine to increase neutrophil production (colony-stimulating factor). You may get this drug through an IV tube or by injection.  Steroids given through an IV tube. If an underlying condition is causing neutropenia, you may need treatment for that condition. If medicines you are taking are causing neutropenia, your health care provider may have you stop taking those medicines. Follow these instructions at home: Medicines  Take over-the-counter and prescription medicines only as told by your health care provider.  Get a seasonal flu shot (influenza vaccine). Lifestyle  Do not eat unpasteurized foods.Do not eat unwashed raw fruits or vegetables.  Avoid exposure to groups of people or children.  Avoid being around people who are sick.  Avoid being around dirt or dust, such as in construction areas or gardens.  Do not provide direct care for pets. Avoid animal droppings. Do not clean litter boxes and bird cages. Hygiene   Bathe daily.  Clean the area between the genitals and the anus (perineal area) after you urinate or have a bowel movement. If you are female, wipe from front to back.  Brush your teeth with a soft toothbrush before and after meals.  Do not use a razor that has a blade. Use an electric razor to remove hair.  Wash your hands often. Make sure others who come in contact with you also wash their hands. If soap and water are not available, use hand sanitizer. General instructions  Do not have sex unless your health care provider has approved.  Take actions to avoid cuts and burns. For example:  Be cautious when you use knives. Always cut away  from yourself.  Keep knives in protective sheaths or guards when not in use.  Use oven mitts when you cook with a hot stove, oven, or grill.  Stand a safe distance away from open fires.  Avoid people who received a vaccine in the past 30 days if that vaccine contained a live version of the germ (live vaccine). You should not get a live vaccine. Common live vaccines are varicella, measles, mumps, and rubella.  Do not share food utensils.  Do not use tampons, enemas, or rectal suppositories unless your health care provider has approved.  Keep all appointments as told by your health care provider. This is important. Contact a health care provider if:  You have a fever.  You have chills or you start to shake.  You have:  A sore throat.  A warm, red, or tender area on your skin.  A cough.  Frequent or painful urination.  Vaginal discharge or itching.  You develop:  Sores in your mouth or anus.  Swollen lymph nodes.  Red streaks on the skin.  A rash.  You feel:  Nauseous or you vomit.  Very fatigued.  Short of breath. This information is not intended to  replace advice given to you by your health care provider. Make sure you discuss any questions you have with your health care provider. Document Released: 03/15/2002 Document Revised: 02/29/2016 Document Reviewed: 04/05/2015  2017 Elsevier  Kidney Stones Kidney stones (urolithiasis) are solid, rock-like deposits that form inside of the organs that make urine (kidneys). A kidney stone may form in a kidney and move into the bladder, where it can cause intense pain and block the flow of urine. Kidney stones are created when high levels of certain minerals are found in the urine. They are usually passed through urination, but in some cases, medical treatment may be needed to remove them. What are the causes? Kidney stones may be caused by:  A condition in which certain glands produce too much parathyroid hormone  (primary hyperparathyroidism), which causes too much calcium buildup in the blood.  Buildup of uric acid crystals in the bladder (hyperuricosuria). Uric acid is a chemical that the body produces when you eat certain foods. It usually exits the body in the urine.  Narrowing (stricture) of one or both of the tubes that drain urine from the kidneys to the bladder (ureters).  A kidney blockage that is present at birth (congenital obstruction).  Past surgery on the kidney or the ureters, such as gastric bypass surgery. What increases the risk? The following factors make you more likely to develop kidney stones:  Having had a kidney stone in the past.  Having a family history of kidney stones.  Not drinking enough water.  Eating a diet that is high in protein, salt (sodium), or sugar.  Being overweight or obese. What are the signs or symptoms? Symptoms of a kidney stone may include:  Nausea.  Vomiting.  Blood in the urine (hematuria).  Pain in the side of the abdomen, right below the ribs (flank pain). Pain usually spreads (radiates) to the groin.  Needing to urinate frequently or urgently. How is this diagnosed? This condition may be diagnosed based on:  Your medical history.  A physical exam.  Blood tests.  Urine tests.  CT scan.  Abdominal X-ray.  A procedure to examine the inside of the bladder (cystoscopy). How is this treated? Treatment for kidney stones depends on the size, location, and makeup of the stones. Treatment may involve:  Analyzing your urine before and after you pass the stone through urination.  Being monitored at the hospital until you pass the stone through urination.  Increasing your fluid intake and decreasing the amount of calcium and protein in your diet.  A procedure to break up kidney stones in the bladder using:  A focused beam of light (laser therapy).  Shock waves (extracorporeal shock wave lithotripsy).  Surgery to remove  kidney stones. This may be needed if you have severe pain or have stones that block your urinary tract. Follow these instructions at home: Eating and drinking  Drink enough fluid to keep your urine clear or pale yellow. This will help you to pass the kidney stone.  If directed, change your diet. This may include:  Limiting how much sodium you eat.  Eating more fruits and vegetables.  Limiting how much meat, poultry, fish, and eggs you eat.  Follow instructions from your health care provider about eating or drinking restrictions. General instructions  Collect urine samples as told by your health care provider. You may need to collect a urine sample:  24 hours after you pass the stone.  8-12 weeks after passing the kidney stone, and every  6-12 months after that.  Strain your urine every time you urinate, for as long as directed. Use the strainer that your health care provider recommends.  Do not throw out the kidney stone after passing it. Keep the stone so it can be tested by your health care provider. Testing the makeup of your kidney stone may help prevent you from getting kidney stones in the future.  Take over-the-counter and prescription medicines only as told by your health care provider.  Keep all follow-up visits as told by your health care provider. This is important. You may need follow-up X-rays or ultrasounds to make sure that your stone has passed. How is this prevented? To prevent another kidney stone:  Drink enough fluid to keep your urine clear or pale yellow. This is the best way to prevent kidney stones.  Eat a healthy diet and follow recommendations from your health care provider about foods to avoid. You may be instructed to eat a low-protein diet. Recommendations vary depending on the type of kidney stone that you have.  Maintain a healthy weight. Contact a health care provider if:  You have pain that gets worse or does not get better with medicine. Get  help right away if:  You have a fever or chills.  You develop severe pain.  You develop new abdominal pain.  You faint.  You are unable to urinate. This information is not intended to replace advice given to you by your health care provider. Make sure you discuss any questions you have with your health care provider. Document Released: 09/23/2005 Document Revised: 04/12/2016 Document Reviewed: 03/08/2016 Elsevier Interactive Patient Education  2017 [employer].

## 2016-10-04 NOTE — Telephone Encounter (Signed)
Pt cld to schedule an appt w/Shadad on 1/31 at 2pm. Pt aware to arrive 30 minutes early. Demographics verified. Letter mailed to the pt.

## 2016-10-06 LAB — URINE CULTURE

## 2016-10-08 LAB — PATHOLOGIST SMEAR REVIEW
BASOS: 1 %
Basophils Absolute: 0 10*3/uL (ref 0.0–0.2)
EOS (ABSOLUTE): 0.1 10*3/uL (ref 0.0–0.4)
Eos: 2 %
HEMATOCRIT: 38.9 % (ref 34.0–46.6)
Hemoglobin: 13.2 g/dL (ref 11.1–15.9)
IMMATURE GRANS (ABS): 0 10*3/uL (ref 0.0–0.1)
Immature Granulocytes: 0 %
LYMPHS ABS: 1 10*3/uL (ref 0.7–3.1)
LYMPHS: 40 %
MCH: 28.6 pg (ref 26.6–33.0)
MCHC: 33.9 g/dL (ref 31.5–35.7)
MCV: 84 fL (ref 79–97)
MONOS ABS: 0.2 10*3/uL (ref 0.1–0.9)
Monocytes: 9 %
NEUTROS ABS: 1.2 10*3/uL — AB (ref 1.4–7.0)
Neutrophils: 48 %
PATH REV RBC: NORMAL
Path Rev PLTs: NORMAL
Platelets: 286 10*3/uL (ref 150–379)
RBC: 4.61 x10E6/uL (ref 3.77–5.28)
RDW: 14.1 % (ref 12.3–15.4)
WBC: 2.5 10*3/uL — AB (ref 3.4–10.8)

## 2016-10-09 NOTE — Telephone Encounter (Signed)
Patient returned call to [facility], patient states she was told to call Sharee Pimple back, and she is at work. Patient is requesting for Sharee Pimple to call her back at her work number [phone] she is at work until 4 today.

## 2016-10-10 NOTE — Telephone Encounter (Signed)
Spoke with patient and confirmed lab results given by [facility]. Pt is scheduled to see Hematology 1/30

## 2016-10-14 ENCOUNTER — Telehealth: Payer: Self-pay | Admitting: Oncology

## 2016-10-14 ENCOUNTER — Encounter: Payer: Self-pay | Admitting: Oncology

## 2016-10-14 NOTE — Telephone Encounter (Signed)
Appt rescheduled to 2/1 at 2pm due to provider being on-call. Pt agreed to the appt date and time. Letter mailed w/appt date and time.

## 2016-11-06 ENCOUNTER — Encounter: Payer: BLUE CROSS/BLUE SHIELD | Admitting: Oncology

## 2016-11-07 ENCOUNTER — Telehealth: Payer: Self-pay | Admitting: Oncology

## 2016-11-07 ENCOUNTER — Encounter: Payer: Self-pay | Admitting: Family Medicine

## 2016-11-07 ENCOUNTER — Ambulatory Visit (INDEPENDENT_AMBULATORY_CARE_PROVIDER_SITE_OTHER): Payer: BLUE CROSS/BLUE SHIELD | Admitting: Family Medicine

## 2016-11-07 ENCOUNTER — Ambulatory Visit (HOSPITAL_BASED_OUTPATIENT_CLINIC_OR_DEPARTMENT_OTHER): Payer: BLUE CROSS/BLUE SHIELD | Admitting: Oncology

## 2016-11-07 VITALS — BP 142/84 | HR 71 | Temp 98.0°F | Resp 18 | Ht 63.0 in | Wt 161.0 lb

## 2016-11-07 VITALS — BP 130/88 | HR 66 | Temp 97.4°F | Resp 16 | Wt 161.0 lb

## 2016-11-07 DIAGNOSIS — M5440 Lumbago with sciatica, unspecified side: Secondary | ICD-10-CM | POA: Diagnosis not present

## 2016-11-07 DIAGNOSIS — D72819 Decreased white blood cell count, unspecified: Secondary | ICD-10-CM | POA: Diagnosis not present

## 2016-11-07 DIAGNOSIS — M5416 Radiculopathy, lumbar region: Secondary | ICD-10-CM

## 2016-11-07 DIAGNOSIS — N200 Calculus of kidney: Secondary | ICD-10-CM

## 2016-11-07 DIAGNOSIS — D709 Neutropenia, unspecified: Secondary | ICD-10-CM | POA: Diagnosis not present

## 2016-11-07 DIAGNOSIS — R102 Pelvic and perineal pain: Secondary | ICD-10-CM | POA: Diagnosis not present

## 2016-11-07 DIAGNOSIS — E279 Disorder of adrenal gland, unspecified: Secondary | ICD-10-CM

## 2016-11-07 DIAGNOSIS — E278 Other specified disorders of adrenal gland: Secondary | ICD-10-CM

## 2016-11-07 LAB — POCT URINALYSIS DIP (MANUAL ENTRY)
BILIRUBIN UA: NEGATIVE
BILIRUBIN UA: NEGATIVE
GLUCOSE UA: NEGATIVE
Nitrite, UA: NEGATIVE
PH UA: 7.5
Protein Ur, POC: NEGATIVE
Spec Grav, UA: 1.015
Urobilinogen, UA: 1

## 2016-11-07 MED ORDER — PREDNISONE 20 MG PO TABS: ORAL_TABLET | ORAL | 0 refills | Status: DC

## 2016-11-07 MED ORDER — SERTRALINE HCL 100 MG PO TABS: 50.0000 mg | ORAL_TABLET | Freq: Every day | ORAL | 1 refills | Status: DC

## 2016-11-07 MED ORDER — TAMSULOSIN HCL 0.4 MG PO CAPS: 0.4000 mg | ORAL_CAPSULE | Freq: Every day | ORAL | 0 refills | Status: DC

## 2016-11-07 MED ORDER — HYDROCODONE-ACETAMINOPHEN 5-325 MG PO TABS: 1.0000 | ORAL_TABLET | ORAL | 0 refills | Status: DC | PRN

## 2016-11-07 NOTE — Telephone Encounter (Signed)
Gave patient avs report and appointments for August.  °

## 2016-11-07 NOTE — Progress Notes (Signed)
Reason for Referral: Leukocytopenia.   HPI: This is a pleasant 61 year old woman currently of [place] where she lived the majority of her life. She has a history of hypertension and fibromyalgia but has been in reasonable health otherwise. She was noted to have mild leukocytopenia and the last few years. Her most recent CBC on 10/04/2016 showed a white cell count of 2.8 with a normal hemoglobin and platelet count. Her neutrophil percentage was 51%. Previous white cell count fluctuated as low as 2.5 on 09/20/2016 with an Impact of 900. Leukocytopenia dates back at least 2013 when her white cell count at the time obtained at [facility] was 3.1. She has no hemoglobin and platelet count. She was seen in the emergency department in December 2017 for abdominal pain which has resolved at this time. She does report chronic myalgias predominantly on the right side including shoulder elbow and hip. Despite the pain, she is able to function and works full time. She denied any fevers, chills, lymphadenopathy or easy bruisability. He continues to attend his activities of daily living.  She does not report any blurry vision, syncope or seizures. She denied any neurological deficits. He does not report any chest pain, palpitation, orthopnea or leg edema. She does not report any cough, wheezing or hemoptysis. She does not report any nausea, vomiting or abdominal pain. She does not report any frequency urgency or hesitancy. She does not report any skeletal complaints. Remaining review of systems unremarkable.   Past Medical History:  Diagnosis Date  . Headache(784.0)   . Hypertension   :  No past surgical history on file.:   Current Outpatient Prescriptions:  .  amLODipine (NORVASC) 5 MG tablet, Take 1 tablet (5 mg total) by mouth daily., Disp: 90 tablet, Rfl: 3 .  butalbital-acetaminophen-caffeine (FIORICET) 50-325-40 MG tablet, Take 1-2 tablets by mouth every 6 (six) hours as needed for headache., Disp: 80  tablet, Rfl: 0 .  ergocalciferol (VITAMIN D2) 50000 units capsule, Take 1 capsule (50,000 Units total) by mouth once a week., Disp: 24 capsule, Rfl: 0 .  HYDROcodone-acetaminophen (NORCO/VICODIN) 5-325 MG tablet, Take 1-2 tablets by mouth every 4 (four) hours as needed for moderate pain., Disp: 90 tablet, Rfl: 0 .  ondansetron (ZOFRAN ODT) 4 MG disintegrating tablet, Take 1 tablet (4 mg total) by mouth every 8 (eight) hours as needed for nausea or vomiting., Disp: 10 tablet, Rfl: 0 .  promethazine (PHENERGAN) 25 MG tablet, Take 1 tablet (25 mg total) by mouth every 8 (eight) hours as needed for nausea or vomiting (migraine headache)., Disp: 80 tablet, Rfl: 0 .  sertraline (ZOLOFT) 50 MG tablet, TAKE 1 TABLET BY MOUTH DAILY, Disp: 90 tablet, Rfl: 1 .  tamsulosin (FLOMAX) 0.4 MG CAPS capsule, Take 1 capsule (0.4 mg total) by mouth daily., Disp: 30 capsule, Rfl: 0 .  traMADol (ULTRAM) 50 MG tablet, Take 1-2 tablets (50-100 mg total) by mouth every 6 (six) hours as needed., Disp: 270 tablet, Rfl: 0 .  TUZISTRA XR 14.7-2.8 MG/5ML SUER, TAKE 10 ML(S)=2 TEASPOONS EVERY 12 HOURS AS NEEDED, Disp: , Rfl: 0:  Allergies  Allergen Reactions  . Topamax [Topiramate] Hives  . Dilaudid [Hydromorphone Hcl] Hives  :  Family History  Problem Relation Age of Onset  . Healthy Mother   :  Social History   Social History  . Marital status: Single    Spouse name: [name]  . Number of children: 4  . Years of education: 12   Occupational History  .  [employer]   Social History Main Topics  . Smoking status: Former Smoker    Quit date: 10/08/1987  . Smokeless tobacco: Never Used  . Alcohol use 0.0 oz/week     Comment: 1 glass of wine occasionally  . Drug use: No  . Sexual activity: Not on file   Other Topics Concern  . Not on file   Social History Narrative   Pt lives at home with her husband, daughter, and 2 grandchildren.    She has four children    Husband needs high-level of her care due to h/o  brain tumor   Drives a city bus   [place] working outside in her yard and gardening.   drinks one cup of coffee daily.   :  Pertinent items are noted in HPI.  Exam: Blood pressure 130/88, pulse 66, temperature 97.4 F (36.3 C), temperature source Oral, resp. rate 16, weight 161 lb (73 kg), SpO2 98 %.  ECOG 0.  General appearance: alert and cooperative appeared without distress. Head: Normocephalic, without obvious abnormality Throat: No oral thrush or ulcers. Neck: no adenopathy Back: negative Resp: clear to auscultation bilaterally no rhonchi, wheezes or dullness to percussion. Cardio: regular rate and rhythm, S1, S2 normal, no murmur, click, rub or gallop GI: soft, non-tender; bowel sounds normal; no masses,  no organomegaly Extremities: extremities normal, atraumatic, no cyanosis or edema Pulses: 2+ and symmetric  CBC    Component Value Date/Time   WBC 2.8 (A) 10/04/2016 0951   WBC 6.1 09/26/2016 1841   RBC 4.59 10/04/2016 0951   RBC 4.80 09/26/2016 1841   HGB 13.3 10/04/2016 0951   HGB 13.8 09/26/2016 1841   HCT 38.4 10/04/2016 0951   HCT 38.9 10/04/2016 0931   PLT 286 10/04/2016 0931   MCV 83.6 10/04/2016 0951   MCV 84 10/04/2016 0931   MCH 29.0 10/04/2016 0951   MCH 28.8 09/26/2016 1841   MCHC 34.6 10/04/2016 0951   MCHC 34.2 09/26/2016 1841   RDW 14.1 10/04/2016 0931   LYMPHSABS 1.0 10/04/2016 0931   MONOABS 232 02/01/2016 1519   EOSABS 0.1 10/04/2016 0931   BASOSABS 0.0 10/04/2016 0931     Chemistry      Component Value Date/Time   NA 142 09/26/2016 1841   K 3.9 09/26/2016 1841   CL 106 09/26/2016 1841   CO2 24 09/26/2016 1841   BUN 19 09/26/2016 1841   CREATININE 0.72 09/26/2016 1841   CREATININE 0.68 05/08/2016 1641      Component Value Date/Time   CALCIUM 9.2 09/26/2016 1841   ALKPHOS 79 09/26/2016 1841   AST 25 09/26/2016 1841   ALT 20 09/26/2016 1841   BILITOT 0.5 09/26/2016 1841       Assessment and Plan:   61 year old woman with the  following issues:  1. Leukocytopenia with a normal differential dates back to 2013. Her total white cell counts have fluctuated as low as 2.5 and normal range and that interval. Her absolute neutrophil count is close to 900 with normal neutrophil percentage.  The differential diagnosis was discussed today with the patient. Leukocytopenia appears to be mild likely of benign etiology. Autoimmune disorder, medication-related also be considered but less likely. I see no evidence to suggest a blood disorder such as leukemia, lymphoma or myeloproliferative or myelodysplastic syndrome.  I do not recommend any further evaluation at this time and I will recommend repeating her CBC in 6 months. If her white cell count continue to be consistent with these findings no  further evaluation will be needed.  2. Infectious prophylaxis and risk: Her risk of infection is very low and have not had any recurrent infections at this time.  3. Follow-up: Will be in 6 months sooner if needed to.

## 2016-11-07 NOTE — Patient Instructions (Signed)
     IF you received an x-ray today, you will receive an invoice from [facility]. Please contact [facility] at [phone] with questions or concerns regarding your invoice.   IF you received labwork today, you will receive an invoice from [facility]. Please contact [facility] at [phone] with questions or concerns regarding your invoice.   Our billing staff will not be able to assist you with questions regarding bills from these companies.  You will be contacted with the lab results as soon as they are available. The fastest way to get your results is to activate your My Chart account. Instructions are located on the last page of this paperwork. If you have not heard from us regarding the results in 2 weeks, please contact this office.     

## 2016-11-07 NOTE — Progress Notes (Signed)
Subjective:    Patient ID: Diane [name], female    DOB: 01/25/1956, 61 y.o.   MRN: [id number] Chief Complaint  Patient presents with  . Medication Management    medication discussion     HPI  Diane [name] is a delightful 61 yo woman who is here to discuss her chronic medications. She has had a low white blood cell count over the past several years but recently her levels dipped quite low with an ANC of 900.  I had her stop her prednisone taper when her labs returned which she had been started on for left sciatica and pt had noted that it worked very well. had pt stop elavil and meloxicam as TCAs and NASIDs can cause neutropenia. Avoid OTC NSAIDs, H2 blockers, and abx. She had failed meloxicam, tramadol, tylenol #3, elavil, and bengay. She has known DDD and has had injections in her L-spine prior. Therefore, we had to use hydrocodone to treat her pain instead with the plan to obtain a lumbar MRI if no improvement in parasthesias or weakness by now.   She was seen by hematology - Dr. Alen Blew - today who noted: Leukocytopenia appears to be mild likely of benign etiology. Autoimmune disorder, medication-related also be considered but less likely. I see no evidence to suggest a blood disorder such as leukemia, lymphoma or myeloproliferative or myelodysplastic syndrome.  I do not recommend any further evaluation at this time and I will recommend repeating her CBC in 6 months. If her white cell count continue to be consistent with these findings no further evaluation will be needed.  2. Infectious prophylaxis and risk: Her risk of infection is very low and have not had any recurrent infections at this time.   Recent CT scan in the ER incidentally noted a 2cm left adrenal gland nodule. Doesn't think she has passed all of her kidney stones as she still has some left inguinal pain. No other urinary symptoms and urine appears normal.  Pain wakes her from sleep at night. Sometimes she will have  to take 2 vicodin in the middle of the night. Has not been using the tramadol.   Had lumbar MRI in 2005 followed by injections which helped a lot. Left leg numb and tingling, weak, gives way when walking so feels like they will collapse under her  HAs are not to bad   Would like to increase her Zoloft.  Past Medical History:  Diagnosis Date  . Headache(784.0)   . Hypertension    History reviewed. No pertinent surgical history. Current Outpatient Prescriptions on File Prior to Visit  Medication Sig Dispense Refill  . amLODipine (NORVASC) 5 MG tablet Take 1 tablet (5 mg total) by mouth daily. 90 tablet 3  . butalbital-acetaminophen-caffeine (FIORICET) 50-325-40 MG tablet Take 1-2 tablets by mouth every 6 (six) hours as needed for headache. 80 tablet 0  . ergocalciferol (VITAMIN D2) 50000 units capsule Take 1 capsule (50,000 Units total) by mouth once a week. 24 capsule 0  . ondansetron (ZOFRAN ODT) 4 MG disintegrating tablet Take 1 tablet (4 mg total) by mouth every 8 (eight) hours as needed for nausea or vomiting. 10 tablet 0  . promethazine (PHENERGAN) 25 MG tablet Take 1 tablet (25 mg total) by mouth every 8 (eight) hours as needed for nausea or vomiting (migraine headache). 80 tablet 0  . traMADol (ULTRAM) 50 MG tablet Take 1-2 tablets (50-100 mg total) by mouth every 6 (six) hours as needed. 270 tablet 0  No current facility-administered medications on file prior to visit.    Allergies  Allergen Reactions  . Topamax [Topiramate] Hives  . Dilaudid [Hydromorphone Hcl] Hives   Family History  Problem Relation Age of Onset  . Healthy Mother    Social History   Social History  . Marital status: Single    Spouse name: [name]  . Number of children: 4  . Years of education: 12   Occupational History  .      [employer]   Social History Main Topics  . Smoking status: Former Smoker    Quit date: 10/08/1987  . Smokeless tobacco: Never Used  . Alcohol use 0.0 oz/week     Comment: 1  glass of wine occasionally  . Drug use: No  . Sexual activity: Not Asked   Other Topics Concern  . None   Social History Narrative   Pt lives at home with her husband, daughter, and 2 grandchildren.    She has four children    Husband needs high-level of her care due to h/o brain tumor   Drives a city bus   [place] working outside in her yard and gardening.   drinks one cup of coffee daily.    Depression screen [facility] 2/9 11/07/2016 10/04/2016 09/20/2016 08/07/2016 05/08/2016  Decreased Interest 0 0 0 0 0  Down, Depressed, Hopeless 0 0 0 0 0  PHQ - 2 Score 0 0 0 0 0     Review of Systems  Constitutional: Positive for activity change and fatigue. Negative for appetite change, chills and fever.  Gastrointestinal: Negative for abdominal pain, constipation, diarrhea, nausea and vomiting.  Genitourinary: Positive for pelvic pain. Negative for decreased urine volume, difficulty urinating, dysuria, flank pain, frequency, hematuria and urgency.  Musculoskeletal: Positive for arthralgias, back pain, gait problem and myalgias. Negative for joint swelling.  Skin: Negative for rash.  Neurological: Positive for weakness, numbness and headaches.  Hematological: Negative for adenopathy. Does not bruise/bleed easily.  Psychiatric/Behavioral: Positive for dysphoric mood and sleep disturbance.       Objective:   Physical Exam  Constitutional: She is oriented to person, place, and time. She appears well-developed and well-nourished. No distress.  HENT:  Head: Normocephalic and atraumatic.  Right Ear: External ear normal.  Eyes: Conjunctivae are normal. No scleral icterus.  Pulmonary/Chest: Effort normal.  Neurological: She is alert and oriented to person, place, and time. She displays atrophy. She exhibits abnormal muscle tone. Gait abnormal.  Reflex Scores:      Patellar reflexes are 2+ on the right side and 1+ on the left side.      Achilles reflexes are 1+ on the right side and 1+ on the left  side. Slightly antalgic gait. +straight leg raise on left Right lower ext strength 5/5 Lt lower ext strength 4+/5 on dorsiflexion, hamstrings, and hip flexor; 5/5 on plantarflexion and quad   Skin: Skin is warm and dry. She is not diaphoretic. No erythema.  Psychiatric: She has a normal mood and affect. Her behavior is normal.   BP (!) 142/84   Pulse 71   Temp 98 F (36.7 C) (Oral)   Resp 18   Ht 5\' 3"  (1.6 m)   Wt 161 lb (73 kg)   SpO2 97%   BMI 28.52 kg/m      Results for orders placed or performed in visit on 11/07/16  POCT urinalysis dipstick  Result Value Ref Range   Color, UA yellow yellow   Clarity, UA clear clear  Glucose, UA negative negative   Bilirubin, UA negative negative   Ketones, POC UA negative negative   Spec Grav, UA 1.015    Blood, UA trace-lysed (A) negative   pH, UA 7.5    Protein Ur, POC negative negative   Urobilinogen, UA 1.0    Nitrite, UA Negative Negative   Leukocytes, UA small (1+) (A) Negative   Assessment & Plan:   1. Suprapubic abdominal pain   2. Back pain of lumbar region with sciatica   3. Left lumbar radiculopathy - try oral pred taper and as she has failed convervative therapy with nsaids and home exercises for > 6 wks with worsening left leg weakness and parasthesias need to proceed with MRI and will likely need ortho spine referral. Pt would like to be seen by the same surgeon who saw her prior but does not remember who - poss Dr. Brien Few now w/ [facility]?  4. Neutropenia, unspecified type ([place]) - seen by Dr. Alen Blew today, suspect benign, repeat cbc in 6 mos and if stable no further eval needed. Will refer back if worsens  5. Left nephrolithiasis - pt still w/ same sxs of left inguinal pain so does not think she has passed all the stones yet. Ok to cont flomax  6. Adrenal nodule ([place]) - incidentally found; may need to consider further imaging at some point  7.      Mood d/o - increase zoloft from 50 to 100.   Orders  Placed This Encounter  Procedures  . POCT urinalysis dipstick    Meds ordered this encounter  Medications  . HYDROcodone-acetaminophen (NORCO/VICODIN) 5-325 MG tablet    Sig: Take 1-2 tablets by mouth every 4 (four) hours as needed for moderate pain.    Dispense:  90 tablet    Refill:  0  . sertraline (ZOLOFT) 100 MG tablet    Sig: Take 0.5 tablets (50 mg total) by mouth daily.    Dispense:  90 tablet    Refill:  1  . tamsulosin (FLOMAX) 0.4 MG CAPS capsule    Sig: Take 1 capsule (0.4 mg total) by mouth daily.    Dispense:  30 capsule    Refill:  0  . predniSONE (DELTASONE) 20 MG tablet    Sig: Take 3 tabs qd x 3d, then 2 tabs qd x 3d then 1 tab qd x 3d.    Dispense:  18 tablet    Refill:  0    [name], M.D.  [facility] [address] [place], [place] [address] [phone] phone [phone] fax  11/07/16 6:26 PM

## 2016-11-13 ENCOUNTER — Other Ambulatory Visit: Payer: Self-pay | Admitting: Family Medicine

## 2016-11-13 NOTE — Telephone Encounter (Signed)
Refilled Vitamin D, last OV was 09/2016. Dr. Raul Del note states that she has failed amitriptyline but is not mentioned in part of the plan to restart this medication. Therefore, I denied this medication and will forward to Dr. Brigitte Pulse for her review.

## 2016-11-18 ENCOUNTER — Ambulatory Visit
Admission: RE | Admit: 2016-11-18 | Discharge: 2016-11-18 | Disposition: A | Discharge status: Home / Self Care | Payer: BLUE CROSS/BLUE SHIELD | Source: Ambulatory Visit | Attending: Family Medicine | Admitting: Family Medicine

## 2016-11-18 DIAGNOSIS — M5440 Lumbago with sciatica, unspecified side: Secondary | ICD-10-CM

## 2016-11-18 DIAGNOSIS — M5126 Other intervertebral disc displacement, lumbar region: Secondary | ICD-10-CM | POA: Diagnosis not present

## 2016-11-18 DIAGNOSIS — M5416 Radiculopathy, lumbar region: Secondary | ICD-10-CM

## 2016-11-25 ENCOUNTER — Other Ambulatory Visit: Payer: Self-pay | Admitting: Family Medicine

## 2016-11-25 NOTE — Telephone Encounter (Signed)
05/2016 last refill

## 2016-11-26 NOTE — Telephone Encounter (Signed)
Called to [facility]. 

## 2016-11-29 NOTE — Telephone Encounter (Signed)
Called pt and LVM but it was generic so just asked her to call back and let me know what medication refills she needs. I'm happy to refill her amitryptiline if she wants to restart on it - just want to know if this was an intentional refill request or an automated.  Please call pt and ask if she does want her amitriptyline refilled. Thanks.

## 2016-11-29 NOTE — Telephone Encounter (Signed)
I left message for patient requesting info per Dr. Raul Del note.

## 2016-11-30 ENCOUNTER — Telehealth: Payer: Self-pay

## 2016-11-30 NOTE — Telephone Encounter (Signed)
Pt said she received a call from our office about ct results. She would like to know for sure if she needs to come in again before she schedules another appointment. She also would like to be referred to [facility] on [facility] to Dr. Brien Few. She would like to be contacted to know if she needs to schedule another appointment with [place].

## 2016-12-02 NOTE — Telephone Encounter (Signed)
Gave patient the results of her xray and encouraged her to do strengthening exercises.  RTC if no improvement or worsening pain.  She said she would do so.

## 2016-12-03 ENCOUNTER — Other Ambulatory Visit: Payer: Self-pay | Admitting: Family Medicine

## 2016-12-03 DIAGNOSIS — M5126 Other intervertebral disc displacement, lumbar region: Secondary | ICD-10-CM

## 2016-12-03 DIAGNOSIS — M5416 Radiculopathy, lumbar region: Secondary | ICD-10-CM

## 2016-12-03 DIAGNOSIS — M5136 Other intervertebral disc degeneration, lumbar region: Secondary | ICD-10-CM

## 2016-12-03 NOTE — Progress Notes (Signed)
Referred to [facility] with Dr. Brien Few per pt request for lumber MRI results

## 2017-01-01 ENCOUNTER — Other Ambulatory Visit: Payer: Self-pay | Admitting: Family Medicine

## 2017-01-04 ENCOUNTER — Encounter (HOSPITAL_COMMUNITY): Payer: Self-pay

## 2017-01-04 ENCOUNTER — Emergency Department (HOSPITAL_COMMUNITY)
Admission: EM | Admit: 2017-01-04 | Discharge: 2017-01-04 | Disposition: A | Discharge status: Home / Self Care | Payer: BLUE CROSS/BLUE SHIELD | Attending: Emergency Medicine | Admitting: Emergency Medicine

## 2017-01-04 ENCOUNTER — Emergency Department (HOSPITAL_COMMUNITY): Payer: BLUE CROSS/BLUE SHIELD

## 2017-01-04 DIAGNOSIS — Z87891 Personal history of nicotine dependence: Secondary | ICD-10-CM | POA: Insufficient documentation

## 2017-01-04 DIAGNOSIS — Y939 Activity, unspecified: Secondary | ICD-10-CM | POA: Insufficient documentation

## 2017-01-04 DIAGNOSIS — Y999 Unspecified external cause status: Secondary | ICD-10-CM | POA: Diagnosis not present

## 2017-01-04 DIAGNOSIS — S199XXA Unspecified injury of neck, initial encounter: Secondary | ICD-10-CM | POA: Diagnosis present

## 2017-01-04 DIAGNOSIS — S39012A Strain of muscle, fascia and tendon of lower back, initial encounter: Secondary | ICD-10-CM

## 2017-01-04 DIAGNOSIS — M545 Low back pain: Secondary | ICD-10-CM | POA: Diagnosis not present

## 2017-01-04 DIAGNOSIS — S3992XA Unspecified injury of lower back, initial encounter: Secondary | ICD-10-CM | POA: Diagnosis not present

## 2017-01-04 DIAGNOSIS — S161XXA Strain of muscle, fascia and tendon at neck level, initial encounter: Secondary | ICD-10-CM | POA: Insufficient documentation

## 2017-01-04 DIAGNOSIS — Y92410 Unspecified street and highway as the place of occurrence of the external cause: Secondary | ICD-10-CM | POA: Diagnosis not present

## 2017-01-04 DIAGNOSIS — I10 Essential (primary) hypertension: Secondary | ICD-10-CM | POA: Diagnosis not present

## 2017-01-04 DIAGNOSIS — M542 Cervicalgia: Secondary | ICD-10-CM | POA: Diagnosis not present

## 2017-01-04 MED ORDER — DICLOFENAC SODIUM 25 MG PO TBEC: 25.0000 mg | DELAYED_RELEASE_TABLET | Freq: Two times a day (BID) | ORAL | 0 refills | Status: DC

## 2017-01-04 MED ORDER — LIDOCAINE 5 % EX PTCH: 1.0000 | MEDICATED_PATCH | CUTANEOUS | 0 refills | Status: DC

## 2017-01-04 MED ORDER — CYCLOBENZAPRINE HCL 10 MG PO TABS
10.0000 mg | ORAL_TABLET | Freq: Once | ORAL | Status: AC
  Administered 2017-01-04: 10 mg via ORAL
  Filled 2017-01-04: qty 1

## 2017-01-04 MED ORDER — METHOCARBAMOL 500 MG PO TABS: 500.0000 mg | ORAL_TABLET | Freq: Two times a day (BID) | ORAL | 0 refills | Status: DC
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
## 2017-01-04 NOTE — Discharge Instructions (Signed)
Do not take the muscle relaxant if driving as it will make you sleepy. Follow up with Dr. Percell Miller or return here for worsening symptoms.

## 2017-01-09 ENCOUNTER — Other Ambulatory Visit: Payer: Self-pay | Admitting: Family Medicine

## 2017-01-09 DIAGNOSIS — M5416 Radiculopathy, lumbar region: Secondary | ICD-10-CM | POA: Diagnosis not present

## 2017-01-09 DIAGNOSIS — M5126 Other intervertebral disc displacement, lumbar region: Secondary | ICD-10-CM | POA: Diagnosis not present

## 2017-01-10 NOTE — Telephone Encounter (Signed)
PHARMACY TOLD PT TO CALL us TO GET A QUICKER RESPOND ON MED REFILL

## 2017-01-15 DIAGNOSIS — M5126 Other intervertebral disc displacement, lumbar region: Secondary | ICD-10-CM | POA: Diagnosis not present

## 2017-01-15 DIAGNOSIS — M5416 Radiculopathy, lumbar region: Secondary | ICD-10-CM | POA: Diagnosis not present

## 2017-01-31 ENCOUNTER — Other Ambulatory Visit: Payer: Self-pay | Admitting: Family Medicine

## 2017-01-31 NOTE — Telephone Encounter (Signed)
Called to [facility]. 

## 2017-01-31 NOTE — Telephone Encounter (Signed)
Needs OV for any additional refills 

## 2017-03-01 ENCOUNTER — Other Ambulatory Visit: Payer: Self-pay | Admitting: Urgent Care

## 2017-03-03 NOTE — Telephone Encounter (Signed)
Last seen 11/26/16

## 2017-04-25 ENCOUNTER — Telehealth: Payer: Self-pay | Admitting: Family Medicine

## 2017-04-25 NOTE — Telephone Encounter (Signed)
Outgoing call to pt re. Waitlist/ left msg to call back if 04/28/17 with Brigitte Pulse @ 8am works better than scheduled 05/08/17 5 pm apt.

## 2017-05-03 DIAGNOSIS — N390 Urinary tract infection, site not specified: Secondary | ICD-10-CM | POA: Diagnosis not present

## 2017-05-03 DIAGNOSIS — T83091A Other mechanical complication of indwelling urethral catheter, initial encounter: Secondary | ICD-10-CM | POA: Diagnosis not present

## 2017-05-08 ENCOUNTER — Ambulatory Visit (INDEPENDENT_AMBULATORY_CARE_PROVIDER_SITE_OTHER): Payer: BLUE CROSS/BLUE SHIELD

## 2017-05-08 ENCOUNTER — Ambulatory Visit (INDEPENDENT_AMBULATORY_CARE_PROVIDER_SITE_OTHER): Payer: BLUE CROSS/BLUE SHIELD | Admitting: Family Medicine

## 2017-05-08 ENCOUNTER — Encounter: Payer: Self-pay | Admitting: Family Medicine

## 2017-05-08 ENCOUNTER — Ambulatory Visit: Payer: BLUE CROSS/BLUE SHIELD | Admitting: Oncology

## 2017-05-08 ENCOUNTER — Other Ambulatory Visit: Payer: BLUE CROSS/BLUE SHIELD

## 2017-05-08 VITALS — BP 121/83 | HR 80 | Temp 98.4°F | Resp 18 | Ht 63.0 in | Wt 166.2 lb

## 2017-05-08 DIAGNOSIS — R1013 Epigastric pain: Secondary | ICD-10-CM | POA: Diagnosis not present

## 2017-05-08 DIAGNOSIS — D252 Subserosal leiomyoma of uterus: Secondary | ICD-10-CM

## 2017-05-08 DIAGNOSIS — M5417 Radiculopathy, lumbosacral region: Secondary | ICD-10-CM

## 2017-05-08 DIAGNOSIS — Z124 Encounter for screening for malignant neoplasm of cervix: Secondary | ICD-10-CM | POA: Diagnosis not present

## 2017-05-08 DIAGNOSIS — F339 Major depressive disorder, recurrent, unspecified: Secondary | ICD-10-CM

## 2017-05-08 DIAGNOSIS — M797 Fibromyalgia: Secondary | ICD-10-CM | POA: Diagnosis not present

## 2017-05-08 DIAGNOSIS — M5136 Other intervertebral disc degeneration, lumbar region: Secondary | ICD-10-CM

## 2017-05-08 DIAGNOSIS — N72 Inflammatory disease of cervix uteri: Secondary | ICD-10-CM

## 2017-05-08 DIAGNOSIS — N760 Acute vaginitis: Secondary | ICD-10-CM

## 2017-05-08 DIAGNOSIS — R1032 Left lower quadrant pain: Secondary | ICD-10-CM | POA: Diagnosis not present

## 2017-05-08 DIAGNOSIS — D72819 Decreased white blood cell count, unspecified: Secondary | ICD-10-CM | POA: Diagnosis not present

## 2017-05-08 DIAGNOSIS — B9689 Other specified bacterial agents as the cause of diseases classified elsewhere: Secondary | ICD-10-CM

## 2017-05-08 DIAGNOSIS — I10 Essential (primary) hypertension: Secondary | ICD-10-CM | POA: Diagnosis not present

## 2017-05-08 DIAGNOSIS — K5909 Other constipation: Secondary | ICD-10-CM

## 2017-05-08 DIAGNOSIS — R739 Hyperglycemia, unspecified: Secondary | ICD-10-CM

## 2017-05-08 DIAGNOSIS — G4700 Insomnia, unspecified: Secondary | ICD-10-CM

## 2017-05-08 DIAGNOSIS — Z1211 Encounter for screening for malignant neoplasm of colon: Secondary | ICD-10-CM | POA: Diagnosis not present

## 2017-05-08 DIAGNOSIS — Z5181 Encounter for therapeutic drug level monitoring: Secondary | ICD-10-CM

## 2017-05-08 DIAGNOSIS — N950 Postmenopausal bleeding: Secondary | ICD-10-CM

## 2017-05-08 DIAGNOSIS — M5416 Radiculopathy, lumbar region: Secondary | ICD-10-CM

## 2017-05-08 LAB — CBC WITH DIFFERENTIAL/PLATELET
BASOS ABS: 0 10*3/uL (ref 0.0–0.2)
Basos: 1 %
EOS (ABSOLUTE): 0.1 10*3/uL (ref 0.0–0.4)
Eos: 3 %
HEMOGLOBIN: 13.8 g/dL (ref 11.1–15.9)
Hematocrit: 39.9 % (ref 34.0–46.6)
IMMATURE GRANS (ABS): 0 10*3/uL (ref 0.0–0.1)
Immature Granulocytes: 0 %
LYMPHS: 17 %
Lymphocytes Absolute: 0.6 10*3/uL — ABNORMAL LOW (ref 0.7–3.1)
MCH: 28.6 pg (ref 26.6–33.0)
MCHC: 34.6 g/dL (ref 31.5–35.7)
MCV: 83 fL (ref 79–97)
MONOCYTES: 5 %
Monocytes Absolute: 0.2 10*3/uL (ref 0.1–0.9)
NEUTROS ABS: 2.6 10*3/uL (ref 1.4–7.0)
NEUTROS PCT: 74 %
PLATELETS: 239 10*3/uL (ref 150–379)
RBC: 4.82 x10E6/uL (ref 3.77–5.28)
RDW: 12.4 % (ref 12.3–15.4)
WBC: 3.5 10*3/uL (ref 3.4–10.8)

## 2017-05-08 LAB — POCT URINALYSIS DIP (MANUAL ENTRY)
BILIRUBIN UA: NEGATIVE
BILIRUBIN UA: NEGATIVE mg/dL
Glucose, UA: NEGATIVE mg/dL
LEUKOCYTES UA: NEGATIVE
Nitrite, UA: NEGATIVE
PH UA: 7 (ref 5.0–8.0)
Protein Ur, POC: NEGATIVE mg/dL
Spec Grav, UA: 1.02 (ref 1.010–1.025)
Urobilinogen, UA: 0.2 E.U./dL

## 2017-05-08 LAB — POC HEMOCCULT BLD/STL (OFFICE/1-CARD/DIAGNOSTIC): FECAL OCCULT BLD: NEGATIVE

## 2017-05-08 LAB — LIPASE: Lipase: 18 U/L (ref 14–72)

## 2017-05-08 MED ORDER — TAMSULOSIN HCL 0.4 MG PO CAPS: 0.4000 mg | ORAL_CAPSULE | Freq: Every day | ORAL | 0 refills | Status: DC

## 2017-05-08 MED ORDER — LACTULOSE 20 GM/30ML PO SOLN: 30.0000 mL | Freq: Two times a day (BID) | ORAL | 0 refills | Status: DC

## 2017-05-08 MED ORDER — HYDROCODONE-ACETAMINOPHEN 5-325 MG PO TABS: 1.0000 | ORAL_TABLET | ORAL | 0 refills | Status: DC | PRN

## 2017-05-08 MED ORDER — METRONIDAZOLE 500 MG PO TABS: 500.0000 mg | ORAL_TABLET | Freq: Two times a day (BID) | ORAL | 0 refills | Status: DC

## 2017-05-08 MED ORDER — CEFTRIAXONE SODIUM 1 G IJ SOLR
1.0000 g | Freq: Once | INTRAMUSCULAR | Status: AC
  Administered 2017-05-08: 1 g via INTRAMUSCULAR

## 2017-05-08 NOTE — Patient Instructions (Addendum)
I recommend doing a miralax clean-out. Put 14 (not kidding) doses of miralax (polyethylene glycol) into 64 oz of any clear, non-carbonated liquid (apple juice, gatorade, water) and drink this WITHIN 24 HOURS!!!! For the next 2d, plan to stay home and just hang around the toilet as you will hopefully be having 8 BM/day of LIQUID stool.  If the diarrhea occurs soon after starting process without passing a significant stool volume or if you are having any fecal incontinence you should keep going as this may be the initial miralax washing around the larger stools.     IF you received an x-ray today, you will receive an invoice from [facility]. Please contact [facility] at [phone] with questions or concerns regarding your invoice.   IF you received labwork today, you will receive an invoice from [facility]. Please contact [facility] at [phone] with questions or concerns regarding your invoice.   Our billing staff will not be able to assist you with questions regarding bills from these companies.  You will be contacted with the lab results as soon as they are available. The fastest way to get your results is to activate your My Chart account. Instructions are located on the last page of this paperwork. If you have not heard from [place] regarding the results in 2 weeks, please contact this office.      Cervicitis Cervicitis is irritation and swelling of the cervix. The cervix is the lower and narrow end of the uterus. It is the part of the uterus that opens up to the vagina. What are the causes? This condition may be caused by:  An STI (sexually transmitted infection), such as gonorrhea, chlamydia, or genital herpes.  Objects that are put in the vagina, such as tampons or birth control devices. This usually occurs if an object is left in for too long.  Chemical irritation or allergic reaction. This may be from vaginal douches, latex condoms, or contraceptive creams.  An  injury to the cervix.  A bacterial infection.  Radiation therapy.  What increases the risk? You are more likely to develop this condition if:  You have unprotected sex.  You have sex with many partners.  You have a new sexual partner.  You start having sex at an early age.  You have a history of STIs.  What are the signs or symptoms? Symptoms of this condition include:  Pearline Cables, white, yellow, or bad-smelling vaginal discharge.  Pain or itchiness around the vagina.  Pain during sex.  Pain in the lower abdomen or lower back, especially during sex.  Urinating often.  Pain during urination.  Abnormal vaginal bleeding, such as bleeding between periods, after sex, or after menopause.  In some cases, there are no symptoms. How is this diagnosed? This condition may be diagnosed with:  A pelvic exam. Your health care provider will examine whether the cervix has an unusual discharge or bleeds easily when touched with a swab.  A wet prep. This is a test in which vaginal discharge is examined under a microscope to check for signs of infection.  A swab test of the cervix. For this test, sample cells from the cervix are collected on a swab and examined under a microscope to check for signs of infection.  Urine tests.  How is this treated? Treatment for cervicitis depends on what is causing the condition. Treatment may include:  Antibiotic medicines. These are used to treat certain infections, including STIs like gonorrhea or chlamydia. If you are taking these  medicines to treat an STI, your sexual partner may also need to take these medicines.  Antiviral medicines. These are used to treat herpes simplex virus. Your sexual partner may also need to take these medicines.  Stopping use of items that cause irritation, such as tampons, latex condoms, douches, or spermicides.  Follow these instructions at home:  Do not have sex until your health care provider says it is  okay.  Take over-the-counter and prescription medicines only as told by your health care provider.  If you were prescribed an antibiotic, take it as told by your health care provider. Do not stop taking the antibiotic even if you start to feel better.  Keep all follow-up visits as told by your health care provider. This is important. Contact a health care provider if:  Your symptoms come back or get worse after treatment.  You have a fever.  You have fatigue.  You have pain in your abdomen.  You experience nausea, vomiting, or diarrhea.  You have back pain. Get help right away if:  You have severe abdominal pain that cannot be helped with medicine.  You cannot urinate. Summary  Cervicitis is irritation and swelling of the cervix.  This condition may be caused by an STI (sexually transmitted infection), an allergic reaction or chemical irritation, radiation therapy, or objects that are put in the vagina, such as tampons or diaphragms.  Symptoms of this condition can include unusual vaginal discharge, painful urination, irritation or pain around the vagina, bleeding between periods or after sex, and pain during sex.  You are more likely to develop this condition if you have unprotected sex, have many sexual partners, or have a history of STIs.  This condition may be treated with antibiotic or antiviral medicines or by stopping use of items that cause irritation. This information is not intended to replace advice given to you by your health care provider. Make sure you discuss any questions you have with your health care provider. Document Released: 09/23/2005 Document Revised: 06/08/2016 Document Reviewed: 06/08/2016 Elsevier Interactive Patient Education  2017 [employer].

## 2017-05-08 NOTE — Progress Notes (Signed)
Subjective:    Patient ID: Diane [name], female    DOB: [birth year], 61 y.o.   MRN: [id number] Chief Complaint  Patient presents with  . Follow-up    pain all over. Hips and legs.   . Nausea    since 0500 shooting pains in stomach. Had to leave work early     HPI  Diane [name] is a 61 yo woman here for a 6 mo follow-up on her chronic medical conditions including fibromyalgia.   Woke ujp at 5 a.m. With epigastric pain. Now down to LLQ. Did have a BM fdaily for the last few days but does have a h/o chornic constipation - sometimes once a week. \Though t she had a UTI due to left flank pain radiating down her left leg 2 wks ago - tried azo as she though t it might be a UTI Has had some subj f/c/sweats, occ nightsweatis. Occ abdominal bloating. Did try some gas tablets and 1/2 tab hydrocodone but then had to leave work due to severity of pain.  Does have nephrolithiasis.   Leukocytosis: Seen by hematology Dr. Alen Blew who did not note any significant concerns and suspect benign etiology. Recommended repeating cbc in 6 mos to ensure this did not continue to progress in which case no further evaluation was needed.  HTN:  Lumbar DDD with sciatica: Had lumbar MRI in 2005 followed by injections which helped a lot. Left leg numb and tingling, weak, gives way when walking so feels like they will collapse under her. Pain wakes her from sleep at night. Sometimes she will have to take 2 vicodin in the middle of the night. Has not been using the tramadol.  Fibromyalgia:   2 Cm left adrenal incidetaloma:   Depression: zoloft  Headaches:   Vit D def: s/p high dose replacement   Past Medical History:  Diagnosis Date  . Headache(784.0)   . Hypertension   . Nephrolithiasis    History reviewed. No pertinent surgical history. Current Outpatient Prescriptions on File Prior to Visit  Medication Sig Dispense Refill  . amLODipine (NORVASC) 5 MG tablet Take 1 tablet (5 mg total) by mouth  daily. 90 tablet 3  . butalbital-acetaminophen-caffeine (FIORICET, ESGIC) 50-325-40 MG tablet Take 1 tablet by mouth every 6 (six) hours as needed for headache. Needs OV for any additional refills 80 tablet 0  . promethazine (PHENERGAN) 25 MG tablet Take 1 tablet (25 mg total) by mouth every 8 (eight) hours as needed for nausea or vomiting (migraine headache). 80 tablet 0  . sertraline (ZOLOFT) 100 MG tablet Take 0.5 tablets (50 mg total) by mouth daily. 90 tablet 1  . sertraline (ZOLOFT) 50 MG tablet TAKE 1 TABLET BY MOUTH DAILY 90 tablet 0  . traMADol (ULTRAM) 50 MG tablet Take 1-2 tablets (50-100 mg total) by mouth every 6 (six) hours as needed. 270 tablet 0  . ondansetron (ZOFRAN ODT) 4 MG disintegrating tablet Take 1 tablet (4 mg total) by mouth every 8 (eight) hours as needed for nausea or vomiting. (Patient not taking: Reported on 05/08/2017) 10 tablet 0  . tamsulosin (FLOMAX) 0.4 MG CAPS capsule Take 1 capsule (0.4 mg total) by mouth daily. Office visit needed for additional refills. 1st notice. (Patient not taking: Reported on 05/08/2017) 30 capsule 0  . Vitamin D, Ergocalciferol, (DRISDOL) 50000 units CAPS capsule TAKE 1 CAPSULE (50,000 UNITS TOTAL) BY MOUTH ONCE A WEEK. (Patient not taking: Reported on 05/08/2017) 24 capsule 1   No current facility-administered medications  on file prior to visit.    Allergies  Allergen Reactions  . Topamax [Topiramate] Hives  . Dilaudid [Hydromorphone Hcl] Hives   Family History  Problem Relation Age of Onset  . Healthy Mother    Social History   Social History  . Marital status: Single    Spouse name: Diane [name]  . Number of children: 4  . Years of education: 12   Occupational History  .      [employer]   Social History Main Topics  . Smoking status: Former Smoker    Quit date: 10/08/1987  . Smokeless tobacco: Never Used  . Alcohol use 0.0 oz/week     Comment: 1 glass of wine occasionally  . Drug use: No  . Sexual activity: Not Asked   Other  Topics Concern  . None   Social History Narrative   Pt lives at home with her husband, daughter, and 2 grandchildren.    She has four children    Husband needs high-level of her care due to h/o brain tumor   Drives a city bus   [place] working outside in her yard and gardening.   drinks one cup of coffee daily.    Depression screen [facility] 2/9 05/08/2017 11/07/2016 10/04/2016 09/20/2016 08/07/2016  Decreased Interest 0 0 0 0 0  Down, Depressed, Hopeless 0 0 0 0 0  PHQ - 2 Score 0 0 0 0 0    Review of Systems  Constitutional: Positive for activity change, appetite change, chills, diaphoresis, fatigue and fever. Negative for unexpected weight change.       Objective:   Physical Exam  Constitutional: She is oriented to person, place, and time. She appears well-developed and well-nourished. No distress.  HENT:  Head: Normocephalic and atraumatic.  Neck: Normal range of motion. Neck supple. No thyromegaly present.  Cardiovascular: Normal rate, regular rhythm, normal heart sounds and intact distal pulses.   Pulmonary/Chest: Effort normal and breath sounds normal. No respiratory distress.  Abdominal: She exhibits no distension. Bowel sounds are increased. There is no hepatosplenomegaly (unable to appreciate due to scarring, body habitus and diffuse pain). There is generalized tenderness. There is guarding. There is no rigidity, no rebound, no CVA tenderness, no tenderness at McBurney's point and negative Murphy's sign.  Numerous abd surgical scars including large vertical excision spanning whole abc  Genitourinary: Rectal exam shows external hemorrhoid. Rectal exam shows no internal hemorrhoid, no mass, no tenderness, anal tone normal and guaiac negative stool. Uterus is enlarged and tender. Cervix exhibits motion tenderness, discharge (mild amount of clear vaginal discharge with strong amine odor.) and friability. Right adnexum displays tenderness. Right adnexum displays no mass. Left adnexum displays  tenderness. Left adnexum displays no mass.  Genitourinary Comments: Suprapubic and L adnexal tenderness > Rt adnexal. No stool in vault.  Musculoskeletal: She exhibits no edema.  Lymphadenopathy:    She has no cervical adenopathy.  Neurological: She is alert and oriented to person, place, and time.  Skin: Skin is warm and dry. She is not diaphoretic. No erythema.  Psychiatric: She has a normal mood and affect. Her behavior is normal.    Results for orders placed or performed in visit on 05/08/17  POCT urinalysis dipstick  Result Value Ref Range   Color, UA yellow yellow   Clarity, UA clear clear   Glucose, UA negative negative mg/dL   Bilirubin, UA negative negative   Ketones, POC UA negative negative mg/dL   Spec Grav, UA 1.020 1.010 - 1.025  Blood, UA trace-intact (A) negative   pH, UA 7.0 5.0 - 8.0   Protein Ur, POC negative negative mg/dL   Urobilinogen, UA 0.2 0.2 or 1.0 E.U./dL   Nitrite, UA Negative Negative   Leukocytes, UA Negative Negative  POC Hemoccult Bld/Stl (1-Cd Office Dx)  Result Value Ref Range   Card #1 Date 05/08/2017    Fecal Occult Blood, POC Negative Negative     BP 121/83   Pulse 80   Temp 98.4 F (36.9 C) (Oral)   Resp 18   Ht 5\' 3"  (1.6 m)   Wt 166 lb 3.2 oz (75.4 kg)   SpO2 97%   BMI 29.44 kg/m   Dg Abd Acute W/chest  Result Date: 05/08/2017 CLINICAL DATA:  Severe epigastric and left lower quadrant pain. EXAM: DG ABDOMEN ACUTE W/ 1V CHEST COMPARISON:  Chest x-ray 08/07/2016 FINDINGS: The lungs are clear without focal pneumonia, edema, pneumothorax or pleural effusion. The cardiopericardial silhouette is within normal limits for size. The visualized bony structures of the thorax are intact. Upright film shows no evidence for intraperitoneal free air. Supine film shows no gaseous bowel dilatation to suggest overt small bowel obstruction. Prominent air-fluid levels in the left upper quadrant may be colonic although focal distention of small bowel  cannot be excluded. Moderate to large stool volume evident. Visualized bony anatomy unremarkable. Multiple phleboliths overlie the anatomic pelvis. IMPRESSION: 1. No acute cardiopulmonary findings. 2. No evidence for bowel perforation or overt small bowel obstruction. Air-fluid levels in the left abdomen may be colonic or indicate inflammatory ileus. CT imaging could be used to further evaluate as clinically warranted. 3. Large stool volume. Electronically Signed   By: Misty Stanley M.D.   On: 05/08/2017 18:44       Assessment & Plan:  Cbc, cmp, a1c,  Due for CPE Plan for adrenal CT in sev more mos (1 yr from last) to reassess.  Colnoscopy??  1. Leukopenia, unspecified type   2. Essential hypertension   3. Fibromyalgia   4. Insomnia, unspecified type   5. Recurrent major depressive disorder, remission status unspecified ([place])   6. DDD (degenerative disc disease), lumbar   7. Medication monitoring encounter   8. Elevated blood sugar   9. Special screening for malignant neoplasms, colon   10. Epigastric pain   11. Abdominal pain, left lower quadrant   12. Screening for cervical cancer   13. Cervicitis   14. Subserous leiomyoma of uterus   15. Postmenopausal vaginal bleeding   16. Bacterial vaginosis   17. Lumbar back pain with radiculopathy affecting left lower extremity   18. Chronic constipation     Orders Placed This Encounter  Procedures  . Urine Culture  . DG Abd Acute W/Chest    Standing Status:   Future    Number of Occurrences:   1    Standing Expiration Date:   05/08/2018    Order Specific Question:   Reason for exam:    Answer:   severe onset epigastric and left lower quadrant pain this a.m with anorexia.    Order Specific Question:   Is the patient pregnant?    Answer:   No    Order Specific Question:   Preferred imaging location?    Answer:   External  . Comprehensive metabolic panel  . Hemoglobin A1c  . Lipase  . CBC with Differential/Platelet  . Ambulatory  referral to Gastroenterology    Referral Priority:   Routine    Referral Type:  Consultation    Referral Reason:   Specialty Services Required    Number of Visits Requested:   1  . POCT urinalysis dipstick  . POC Hemoccult Bld/Stl (1-Cd Office Dx)    Meds ordered this encounter  Medications  . HYDROcodone-acetaminophen (NORCO/VICODIN) 5-325 MG tablet    Sig: Take 1-2 tablets by mouth every 4 (four) hours as needed for moderate pain.    Dispense:  60 tablet    Refill:  0  . tamsulosin (FLOMAX) 0.4 MG CAPS capsule    Sig: Take 1 capsule (0.4 mg total) by mouth daily.    Dispense:  30 capsule    Refill:  0    CUSTOMER NEEDS REFILLS. AUTO REFILL  . cefTRIAXone (ROCEPHIN) injection 1 g  . metroNIDAZOLE (FLAGYL) 500 MG tablet    Sig: Take 1 tablet (500 mg total) by mouth 2 (two) times daily.    Dispense:  14 tablet    Refill:  0  . Lactulose 20 GM/30ML SOLN    Sig: Take 30 mLs (20 g total) by mouth 2 (two) times daily.    Dispense:  473 mL    Refill:  0      [name], M.D.  Primary Care at [facility] [address] [place], [place] [address] [phone] phone [phone] fax  05/08/17 6:54 PM

## 2017-05-09 ENCOUNTER — Encounter: Payer: Self-pay | Admitting: *Deleted

## 2017-05-09 LAB — COMPREHENSIVE METABOLIC PANEL
ALT: 10 IU/L (ref 0–32)
AST: 19 IU/L (ref 0–40)
Albumin/Globulin Ratio: 1.4 (ref 1.2–2.2)
Albumin: 4.2 g/dL (ref 3.5–5.5)
Alkaline Phosphatase: 87 IU/L (ref 39–117)
BUN/Creatinine Ratio: 15 (ref 9–23)
BUN: 10 mg/dL (ref 6–24)
Bilirubin Total: 0.4 mg/dL (ref 0.0–1.2)
CALCIUM: 9.6 mg/dL (ref 8.7–10.2)
CO2: 22 mmol/L (ref 20–29)
CREATININE: 0.68 mg/dL (ref 0.57–1.00)
Chloride: 103 mmol/L (ref 96–106)
GFR calc non Af Amer: 96 mL/min/{1.73_m2} (ref >59)
GFR, EST AFRICAN AMERICAN: 111 mL/min/{1.73_m2} (ref >59)
GLOBULIN, TOTAL: 3 g/dL (ref 1.5–4.5)
Glucose: 88 mg/dL (ref 65–99)
Potassium: 3.4 mmol/L — ABNORMAL LOW (ref 3.5–5.2)
Sodium: 141 mmol/L (ref 134–144)
Total Protein: 7.2 g/dL (ref 6.0–8.5)

## 2017-05-09 LAB — HEMOGLOBIN A1C
Est. average glucose Bld gHb Est-mCnc: 117 mg/dL
Hgb A1c MFr Bld: 5.7 % — ABNORMAL HIGH (ref 4.8–5.6)

## 2017-05-11 LAB — URINE CULTURE

## 2017-05-11 LAB — WET PREP FOR TRICH, YEAST, CLUE
CLUE CELL EXAM: NEGATIVE
Trichomonas Exam: NEGATIVE
YEAST EXAM: NEGATIVE

## 2017-05-12 LAB — PAP IG, CT-NG NAA, HPV HIGH-RISK
CHLAMYDIA, NUC. ACID AMP: NEGATIVE
Gonococcus by Nucleic Acid Amp: NEGATIVE
HPV, high-risk: NEGATIVE
PAP Smear Comment: 0

## 2017-06-01 ENCOUNTER — Other Ambulatory Visit: Payer: Self-pay | Admitting: Family Medicine

## 2017-06-04 ENCOUNTER — Other Ambulatory Visit: Payer: Self-pay | Admitting: Family Medicine

## 2017-06-21 ENCOUNTER — Other Ambulatory Visit: Payer: Self-pay | Admitting: Family Medicine

## 2017-07-10 ENCOUNTER — Ambulatory Visit (INDEPENDENT_AMBULATORY_CARE_PROVIDER_SITE_OTHER): Payer: BLUE CROSS/BLUE SHIELD | Admitting: Family Medicine

## 2017-07-10 ENCOUNTER — Encounter: Payer: Self-pay | Admitting: Family Medicine

## 2017-07-10 VITALS — BP 142/80 | HR 85 | Temp 98.4°F | Resp 18 | Ht 63.0 in | Wt 172.6 lb

## 2017-07-10 DIAGNOSIS — M797 Fibromyalgia: Secondary | ICD-10-CM | POA: Diagnosis not present

## 2017-07-10 DIAGNOSIS — M79606 Pain in leg, unspecified: Secondary | ICD-10-CM | POA: Diagnosis not present

## 2017-07-10 DIAGNOSIS — R110 Nausea: Secondary | ICD-10-CM | POA: Diagnosis not present

## 2017-07-10 DIAGNOSIS — K219 Gastro-esophageal reflux disease without esophagitis: Secondary | ICD-10-CM

## 2017-07-10 DIAGNOSIS — M79603 Pain in arm, unspecified: Secondary | ICD-10-CM

## 2017-07-10 DIAGNOSIS — E279 Disorder of adrenal gland, unspecified: Secondary | ICD-10-CM

## 2017-07-10 DIAGNOSIS — E278 Other specified disorders of adrenal gland: Secondary | ICD-10-CM

## 2017-07-10 LAB — POCT URINALYSIS DIP (MANUAL ENTRY)
Bilirubin, UA: NEGATIVE
Glucose, UA: NEGATIVE mg/dL
Ketones, POC UA: NEGATIVE mg/dL
Nitrite, UA: NEGATIVE
Protein Ur, POC: NEGATIVE mg/dL
Spec Grav, UA: 1.025 (ref 1.010–1.025)
Urobilinogen, UA: 1 E.U./dL
pH, UA: 7 (ref 5.0–8.0)

## 2017-07-10 LAB — POCT CBC
Granulocyte percent: 49.2 %G (ref 37–80)
HEMATOCRIT: 38.1 % (ref 37.7–47.9)
HEMOGLOBIN: 12.9 g/dL (ref 12.2–16.2)
LYMPH, POC: 1.4 (ref 0.6–3.4)
MCH, POC: 28.4 pg (ref 27–31.2)
MCHC: 33.7 g/dL (ref 31.8–35.4)
MCV: 84.3 fL (ref 80–97)
MID (cbc): 0.4 (ref 0–0.9)
MPV: 6.8 fL (ref 0–99.8)
POC GRANULOCYTE: 1.7 — AB (ref 2–6.9)
POC LYMPH %: 38.7 % (ref 10–50)
POC MID %: 12.1 %M — AB (ref 0–12)
Platelet Count, POC: 281 10*3/uL (ref 142–424)
RBC: 4.53 M/uL (ref 4.04–5.48)
RDW, POC: 13.3 %
WBC: 3.5 10*3/uL — AB (ref 4.6–10.2)

## 2017-07-10 MED ORDER — HYDROCODONE-ACETAMINOPHEN 5-325 MG PO TABS: 1.0000 | ORAL_TABLET | ORAL | 0 refills | Status: DC | PRN

## 2017-07-10 MED ORDER — TRAMADOL HCL 50 MG PO TABS: 50.0000 mg | ORAL_TABLET | Freq: Four times a day (QID) | ORAL | 0 refills | Status: DC | PRN

## 2017-07-10 MED ORDER — METOCLOPRAMIDE HCL 5 MG PO TBDP: 5.0000 mg | ORAL_TABLET | Freq: Three times a day (TID) | ORAL | 1 refills | Status: DC

## 2017-07-10 MED ORDER — ONDANSETRON 8 MG PO TBDP: 8.0000 mg | ORAL_TABLET | Freq: Three times a day (TID) | ORAL | 5 refills | Status: DC | PRN

## 2017-07-10 MED ORDER — BUTALBITAL-APAP-CAFFEINE 50-325-40 MG PO TABS: 1.0000 | ORAL_TABLET | Freq: Four times a day (QID) | ORAL | 0 refills | Status: DC | PRN

## 2017-07-10 MED ORDER — ONDANSETRON 4 MG PO TBDP
8.0000 mg | ORAL_TABLET | Freq: Once | ORAL | Status: AC
  Administered 2017-07-10: 8 mg via ORAL

## 2017-07-10 MED ORDER — DULOXETINE HCL 30 MG PO CPEP: 30.0000 mg | ORAL_CAPSULE | Freq: Every day | ORAL | 0 refills | Status: DC

## 2017-07-10 NOTE — Patient Instructions (Signed)
     IF you received an x-ray today, you will receive an invoice from [facility]. Please contact [facility] at [phone] with questions or concerns regarding your invoice.   IF you received labwork today, you will receive an invoice from [facility]. Please contact [facility] at [phone] with questions or concerns regarding your invoice.   Our billing staff will not be able to assist you with questions regarding bills from these companies.  You will be contacted with the lab results as soon as they are available. The fastest way to get your results is to activate your My Chart account. Instructions are located on the last page of this paperwork. If you have not heard from us regarding the results in 2 weeks, please contact this office.     

## 2017-07-10 NOTE — Progress Notes (Addendum)
Subjective:    Patient ID: Diane [name], female    DOB: [birth year], 61 y.o.   MRN: [id number] Chief Complaint  Patient presents with  . All over body pain    x1 week, pt states her body hurts all over and she feels nauseous. Pt states she has tried pain medication and it isn't helping.  Marland Kitchen Headache    Headache   Associated symptoms include a fever.    Diane [name] is a 61 yo woman here for a 6 mo follow-up on her chronic medical conditions including fibromyalgia. Was a little better but just started getting bad again from left foot and shoulder and now right side starting to bother her. Mainly left every day but right is starting to get worse. Not sleeping well as she is in pain - sleep was intermittent before the pain started.   Nausea med not working. No pain, no bloating, no emesis (once sev wks ago).  Is having a stool 1-2x/d. Normal GU. No night sweats.   HAs are the same.  Will take the promethazine Will be suddently "sick as a dog" with a nausea. zofran helped but she ran out. Occ improves.  Taking fish oil and meds on list. Tried tylenol arthritis but didn't touch her. Occ GERD. +metallic taste in mouth and mucous in  Stopped lactulose this week as she has been so sock but did not help at all.  Has had 3 or 4 intestinal surgeries prior and was told if she has any  More issues she should just try to de3al with it as otherwise might end up having a bag and she has to take care of her husband.   Hip will suddenly give away.   Leukocytosis: Seen by hematology Dr. Alen Blew who did not note any significant concerns and suspect benign etiology. Recommended repeating cbc in 6 mos to ensure this did not continue to progress in which case no further evaluation was needed.  HTN:  Lumbar DDD with sciatica: Had lumbar MRI in 2005 followed by injections which helped a lot. Left leg numb and tingling, weak, gives way when walking so feels like they will collapse under her. Pain wakes  her from sleep at night. Sometimes she will have to take 2 vicodin in the middle of the night. Has not been using the tramadol.  Fibromyalgia:   2 Cm left adrenal incidetaloma:   Depression: zoloft  Headaches:   Vit D def: s/p high dose replacement   Past Medical History:  Diagnosis Date  . Headache(784.0)   . Hypertension   . Multiple sites of bowel obstruction ([place]) 2005  . Nephrolithiasis   . Nephrolithiasis   . Uterine fibroid    Past Surgical History:  Procedure Laterality Date  . bowel obstruction    . CHOLECYSTECTOMY    . endometrial curretage  2005  . GANGLION CYST EXCISION Right 2001   hand  . HERNIA REPAIR Left    inguinal   Current Outpatient Prescriptions on File Prior to Visit  Medication Sig Dispense Refill  . amLODipine (NORVASC) 5 MG tablet TAKE 1 TABLET (5 MG TOTAL) BY MOUTH DAILY. 90 tablet 3  . butalbital-acetaminophen-caffeine (FIORICET, ESGIC) 50-325-40 MG tablet Take 1 tablet by mouth every 6 (six) hours as needed for headache. Needs OV for any additional refills 80 tablet 0  . HYDROcodone-acetaminophen (NORCO/VICODIN) 5-325 MG tablet Take 1-2 tablets by mouth every 4 (four) hours as needed for moderate pain. 60 tablet 0  . lactulose (  CHRONULAC) 10 GM/15ML solution TAKE 30 MLS (20 G TOTAL) BY MOUTH 2 (TWO) TIMES DAILY. 473 mL 0  . metroNIDAZOLE (FLAGYL) 500 MG tablet Take 1 tablet (500 mg total) by mouth 2 (two) times daily. 14 tablet 0  . promethazine (PHENERGAN) 25 MG tablet Take 1 tablet (25 mg total) by mouth every 8 (eight) hours as needed for nausea or vomiting (migraine headache). 80 tablet 0  . sertraline (ZOLOFT) 50 MG tablet TAKE 1 TABLET BY MOUTH DAILY 90 tablet 0  . ondansetron (ZOFRAN ODT) 4 MG disintegrating tablet Take 1 tablet (4 mg total) by mouth every 8 (eight) hours as needed for nausea or vomiting. (Patient not taking: Reported on 05/08/2017) 10 tablet 0  . tamsulosin (FLOMAX) 0.4 MG CAPS capsule Take 1 capsule (0.4 mg total) by mouth  daily. (Patient not taking: Reported on 07/10/2017) 30 capsule 0  . traMADol (ULTRAM) 50 MG tablet Take 1-2 tablets (50-100 mg total) by mouth every 6 (six) hours as needed. (Patient not taking: Reported on 07/10/2017) 270 tablet 0  . Vitamin D, Ergocalciferol, (DRISDOL) 50000 units CAPS capsule TAKE 1 CAPSULE (50,000 UNITS TOTAL) BY MOUTH ONCE A WEEK. (Patient not taking: Reported on 05/08/2017) 24 capsule 1   No current facility-administered medications on file prior to visit.    Allergies  Allergen Reactions  . Topamax [Topiramate] Hives  . Dilaudid [Hydromorphone Hcl] Hives   Family History  Problem Relation Age of Onset  . Healthy Mother    Social History   Social History  . Marital status: Single    Spouse name: [name]  . Number of children: 4  . Years of education: 12   Occupational History  .      [employer]   Social History Main Topics  . Smoking status: Former Smoker    Quit date: 10/08/1987  . Smokeless tobacco: Never Used  . Alcohol use 0.0 oz/week     Comment: 1 glass of wine occasionally  . Drug use: No  . Sexual activity: Not Asked   Other Topics Concern  . None   Social History Narrative   Pt lives at home with her husband, daughter, and 2 grandchildren.    She has four children    Husband needs high-level of her care due to h/o brain tumor   Drives a city bus   [place] working outside in her yard and gardening.   drinks one cup of coffee daily.    Depression screen [facility] 2/9 07/10/2017 05/08/2017 11/07/2016 10/04/2016 09/20/2016  Decreased Interest 0 0 0 0 0  Down, Depressed, Hopeless 0 0 0 0 0  PHQ - 2 Score 0 0 0 0 0    Review of Systems  Constitutional: Positive for activity change, appetite change, chills, diaphoresis, fatigue and fever. Negative for unexpected weight change.  Neurological: Positive for headaches.       Objective:   Physical Exam  Constitutional: She is oriented to person, place, and time. She appears well-developed and well-nourished. No  distress.  HENT:  Head: Normocephalic and atraumatic.  Neck: Normal range of motion. Neck supple. No thyromegaly present.  Cardiovascular: Normal rate, regular rhythm, normal heart sounds and intact distal pulses.   Pulmonary/Chest: Effort normal and breath sounds normal. No respiratory distress.  Abdominal: She exhibits no distension. Bowel sounds are increased. There is no hepatosplenomegaly (unable to appreciate due to scarring, body habitus and diffuse pain). There is generalized tenderness. There is guarding. There is no rigidity, no rebound, no CVA tenderness,  no tenderness at McBurney's point and negative Murphy's sign.  Numerous abd surgical scars including large vertical excision spanning whole abc  Genitourinary: Rectal exam shows external hemorrhoid. Rectal exam shows no internal hemorrhoid, no mass, no tenderness, anal tone normal and guaiac negative stool. Uterus is enlarged and tender. Cervix exhibits motion tenderness, discharge (mild amount of clear vaginal discharge with strong amine odor.) and friability. Right adnexum displays tenderness. Right adnexum displays no mass. Left adnexum displays tenderness. Left adnexum displays no mass.  Genitourinary Comments: Suprapubic and L adnexal tenderness > Rt adnexal. No stool in vault.  Musculoskeletal: She exhibits no edema.  Lymphadenopathy:    She has no cervical adenopathy.  Neurological: She is alert and oriented to person, place, and time.  Skin: Skin is warm and dry. She is not diaphoretic. No erythema.  Psychiatric: She has a normal mood and affect. Her behavior is normal.    CT abd/pelvis:  No gallpladder. 2 cM left adrenal gland nodule - nonemergent adreanl CT. Enlarged uterus with fibroids. H/o kidney stones.   BP (!) 142/80 (BP Location: Right Arm, Patient Position: Sitting, Cuff Size: Normal)   Pulse 85   Temp 98.4 F (36.9 C) (Oral)   Resp 18   Ht 5\' 3"  (1.6 m)   Wt 172 lb 9.6 oz (78.3 kg)   SpO2 96%   BMI 30.57  kg/m     Assessment & Plan:   Due for CPE Order adrenal CT (~ 1 yr from last) to reassess.  1. Nausea without vomiting   2. Gastroesophageal reflux disease, esophagitis presence not specified   3. Upper and lower extremity pain   4. Adrenal nodule ([place])   5. Fibromyalgia - did well on cymbalta in yrs prior but we had to stop it due to cost - however, now generic so retry - start 30mg  x 1 mo and if doing well, call and will increase to 60.    Orders Placed This Encounter  Procedures  . CT ADRENAL ABDOMEN WO CONTRAST    Standing Status:   Future    Standing Expiration Date:   10/10/2018    Order Specific Question:   Is patient pregnant?    Answer:   No    Order Specific Question:   Preferred imaging location?    Answer:   [facility]    Order Specific Question:   Radiology Contrast Protocol - do NOT remove file path    Answer:   [email or web address]    Order Specific Question:   Reason for Exam additional comments    Answer:   eval 2 cm Left adrenal nodule seen on abd/pelvic CT 09/2016  . H. pylori breath test  . Comprehensive metabolic panel  . CK  . Sedimentation rate  . C-reactive protein  . TSH  . Comprehensive metabolic panel  . POCT CBC  . POCT urinalysis dipstick    Meds ordered this encounter  Medications  . ondansetron (ZOFRAN-ODT) disintegrating tablet 8 mg  . ondansetron (ZOFRAN-ODT) 8 MG disintegrating tablet    Sig: Take 1 tablet (8 mg total) by mouth every 8 (eight) hours as needed for nausea or vomiting.    Dispense:  20 tablet    Refill:  5  . traMADol (ULTRAM) 50 MG tablet    Sig: Take 1-2 tablets (50-100 mg total) by mouth every 6 (six) hours as needed.    Dispense:  270 tablet    Refill:  0  . HYDROcodone-acetaminophen (NORCO/VICODIN) 5-325 MG tablet  Sig: Take 1-2 tablets by mouth every 4 (four) hours as needed for moderate pain.    Dispense:  60 tablet    Refill:  0  . butalbital-acetaminophen-caffeine (FIORICET,  ESGIC) 50-325-40 MG tablet    Sig: Take 1 tablet by mouth every 6 (six) hours as needed for headache.    Dispense:  80 tablet    Refill:  0  . DULoxetine (CYMBALTA) 30 MG capsule    Sig: Take 1 capsule (30 mg total) by mouth daily.    Dispense:  30 capsule    Refill:  0  . Metoclopramide HCl 5 MG TBDP    Sig: Take 1 tablet (5 mg total) by mouth 4 (four) times daily -  before meals and at bedtime.    Dispense:  120 tablet    Refill:  1   [name], M.D.  Primary Care at [facility] [address] [place], [place] [address] [phone] phone [phone] fax  07/10/17 6:04 PM

## 2017-07-11 ENCOUNTER — Telehealth: Payer: Self-pay | Admitting: Family Medicine

## 2017-07-11 LAB — COMPREHENSIVE METABOLIC PANEL
ALK PHOS: 86 IU/L (ref 39–117)
ALT: 17 IU/L (ref 0–32)
AST: 28 IU/L (ref 0–40)
Albumin/Globulin Ratio: 1.6 (ref 1.2–2.2)
Albumin: 4.5 g/dL (ref 3.6–4.8)
BILIRUBIN TOTAL: 0.2 mg/dL (ref 0.0–1.2)
BUN/Creatinine Ratio: 24 (ref 12–28)
BUN: 14 mg/dL (ref 8–27)
CHLORIDE: 106 mmol/L (ref 96–106)
CO2: 25 mmol/L (ref 20–29)
CREATININE: 0.59 mg/dL (ref 0.57–1.00)
Calcium: 9.6 mg/dL (ref 8.7–10.3)
GFR calc Af Amer: 115 mL/min/{1.73_m2} (ref >59)
GFR calc non Af Amer: 100 mL/min/{1.73_m2} (ref >59)
GLUCOSE: 93 mg/dL (ref 65–99)
Globulin, Total: 2.8 g/dL (ref 1.5–4.5)
Potassium: 3.8 mmol/L (ref 3.5–5.2)
Sodium: 146 mmol/L — ABNORMAL HIGH (ref 134–144)
TOTAL PROTEIN: 7.3 g/dL (ref 6.0–8.5)

## 2017-07-11 LAB — H. PYLORI BREATH TEST: H PYLORI BREATH TEST: NEGATIVE

## 2017-07-11 LAB — C-REACTIVE PROTEIN: CRP: 2.7 mg/L (ref 0.0–4.9)

## 2017-07-11 LAB — SEDIMENTATION RATE: Sed Rate: 5 mm/hr (ref 0–40)

## 2017-07-11 LAB — CK: CK TOTAL: 162 U/L (ref 24–173)

## 2017-07-11 NOTE — Telephone Encounter (Signed)
Pt called from [facility] pharmacy stated that she needs this medication Metoclopramide HCl 5 MG TBDP [[id number]] in regular tablets not dissolve..  Please advise

## 2017-07-14 ENCOUNTER — Encounter: Payer: Self-pay | Admitting: Family Medicine

## 2017-07-14 NOTE — Telephone Encounter (Signed)
Lm message for patient to call back.  I spoke with Dr. Brigitte Pulse we can call in a dissolvable tab if she would like one.   These should be the regular tabs if that is what she wants.      Tell patient to let us know and we can do either or.

## 2017-07-15 NOTE — Telephone Encounter (Signed)
Pt stated that the [facility] on [address] stated that that medication was called in as dissolvable tabs advised pt that it should be for the regular tabs and not the dissolved.. Pt DOES NOT want dissolve.. Pt asked could [facility] on [address] [phone] and get things straighten out. Please advise

## 2017-07-15 NOTE — Telephone Encounter (Signed)
Pt called back and found out from pharmacist that her prescription for nausea has not been updated. Wants regular tablets ordered not dissolvable. Best phone number for pharmacy is [phone]  FR

## 2017-07-16 NOTE — Telephone Encounter (Signed)
I called [facility] and gave them the verbal auth to change to regular tabs instead of dissolvable and  I also called and notified the patient

## 2017-07-25 ENCOUNTER — Other Ambulatory Visit: Payer: Self-pay | Admitting: Family Medicine

## 2017-08-06 ENCOUNTER — Telehealth: Payer: Self-pay | Admitting: Family Medicine

## 2017-08-06 NOTE — Telephone Encounter (Addendum)
THIS MESSAGE IS FROM SHANNON FROM [place] DR. SHAW: THIS PATIENT IS SCHEDULED TO HAVE A CT OF THE ABDOMEN WITH NO CONTRAST ON Friday 08/08/17 AT 3:15pm. IT LOOKS [place] IT. SHANNON NEEDS TO HAVE DR. SHAW FAX OVER AN ORDER SO THEY WILL HAVE IT WHEN THE PATIENT ARRIVES Friday. BEST PHONE IF QUESTIONS [phone] OPTION 5 (PLEASE ASK FOR SHANNON) THE FAX NUMBER IS [phone]. [place]

## 2017-08-08 ENCOUNTER — Encounter: Payer: Self-pay | Admitting: Family Medicine

## 2017-08-08 DIAGNOSIS — D3502 Benign neoplasm of left adrenal gland: Secondary | ICD-10-CM | POA: Diagnosis not present

## 2017-08-08 NOTE — Telephone Encounter (Signed)
A rep from [facility] came by to pick up the order.  [employer] had changed where we sent the original order to, which was [facility].  They did this due to costs for the patient, but they did not tell us.

## 2017-08-15 ENCOUNTER — Other Ambulatory Visit: Payer: Self-pay | Admitting: Family Medicine

## 2017-08-15 NOTE — Telephone Encounter (Signed)
Cymbalta refill, ? Dosage change according to the pt

## 2017-08-19 MED ORDER — DULOXETINE HCL 60 MG PO CPEP: 60.0000 mg | ORAL_CAPSULE | Freq: Every day | ORAL | 1 refills | Status: DC

## 2017-08-19 NOTE — Telephone Encounter (Signed)
Please Advise

## 2017-08-19 NOTE — Telephone Encounter (Signed)
Sent in 60mg  cymbalta

## 2018-01-12 ENCOUNTER — Ambulatory Visit (INDEPENDENT_AMBULATORY_CARE_PROVIDER_SITE_OTHER): Payer: BLUE CROSS/BLUE SHIELD

## 2018-01-12 ENCOUNTER — Other Ambulatory Visit: Payer: Self-pay

## 2018-01-12 ENCOUNTER — Ambulatory Visit: Payer: BLUE CROSS/BLUE SHIELD | Admitting: Family Medicine

## 2018-01-12 ENCOUNTER — Encounter: Payer: Self-pay | Admitting: Family Medicine

## 2018-01-12 VITALS — BP 126/84 | HR 78 | Temp 98.1°F | Resp 16 | Ht 63.0 in | Wt 167.0 lb

## 2018-01-12 DIAGNOSIS — Z5181 Encounter for therapeutic drug level monitoring: Secondary | ICD-10-CM

## 2018-01-12 DIAGNOSIS — M25571 Pain in right ankle and joints of right foot: Secondary | ICD-10-CM

## 2018-01-12 DIAGNOSIS — E278 Other specified disorders of adrenal gland: Secondary | ICD-10-CM

## 2018-01-12 DIAGNOSIS — M25512 Pain in left shoulder: Secondary | ICD-10-CM | POA: Diagnosis not present

## 2018-01-12 DIAGNOSIS — E559 Vitamin D deficiency, unspecified: Secondary | ICD-10-CM | POA: Diagnosis not present

## 2018-01-12 DIAGNOSIS — M797 Fibromyalgia: Secondary | ICD-10-CM

## 2018-01-12 DIAGNOSIS — M25511 Pain in right shoulder: Secondary | ICD-10-CM

## 2018-01-12 DIAGNOSIS — G4709 Other insomnia: Secondary | ICD-10-CM | POA: Diagnosis not present

## 2018-01-12 DIAGNOSIS — E279 Disorder of adrenal gland, unspecified: Secondary | ICD-10-CM

## 2018-01-12 MED ORDER — HYDROCODONE-ACETAMINOPHEN 10-325 MG PO TABS: 1.0000 | ORAL_TABLET | ORAL | 0 refills | Status: DC | PRN

## 2018-01-12 MED ORDER — CYCLOBENZAPRINE HCL 10 MG PO TABS: 10.0000 mg | ORAL_TABLET | Freq: Every day | ORAL | 3 refills | Status: DC

## 2018-01-12 MED ORDER — TRAMADOL HCL 50 MG PO TABS: 50.0000 mg | ORAL_TABLET | Freq: Four times a day (QID) | ORAL | 0 refills | Status: DC | PRN

## 2018-01-12 MED ORDER — PREDNISONE 20 MG PO TABS: ORAL_TABLET | ORAL | 0 refills | Status: DC

## 2018-01-12 MED ORDER — DULOXETINE HCL 60 MG PO CPEP: 120.0000 mg | ORAL_CAPSULE | Freq: Every day | ORAL | 1 refills | Status: DC

## 2018-01-12 NOTE — Patient Instructions (Addendum)
   IF you received an x-ray today, you will receive an invoice from [facility]. Please contact [facility] at [phone] with questions or concerns regarding your invoice.   IF you received labwork today, you will receive an invoice from [facility]. Please contact [facility] at [phone] with questions or concerns regarding your invoice.   Our billing staff will not be able to assist you with questions regarding bills from these companies.  You will be contacted with the lab results as soon as they are available. The fastest way to get your results is to activate your My Chart account. Instructions are located on the last page of this paperwork. If you have not heard from us regarding the results in 2 weeks, please contact this office.     Myofascial Pain Syndrome and Fibromyalgia Myofascial pain syndrome and fibromyalgia are both pain disorders. This pain may be felt mainly in your muscles.  Myofascial pain syndrome: ? Always has trigger points or tender points in the muscle that will cause pain when pressed. The pain may come and go. ? Usually affects your neck, upper back, and shoulder areas. The pain often radiates into your arms and hands.  Fibromyalgia: ? Has muscle pains and tenderness that come and go. ? Is often associated with fatigue and sleep disturbances. ? Has trigger points. ? Tends to be long-lasting (chronic), but is not life-threatening.  Fibromyalgia and myofascial pain are not the same. However, they often occur together. If you have both conditions, each can make the other worse. Both are common and can cause enough pain and fatigue to make day-to-day activities difficult. What are the causes? The exact causes of fibromyalgia and myofascial pain are not known. People with certain gene types may be more likely to develop fibromyalgia. Some factors can be triggers for both conditions, such as:  Spine disorders.  Arthritis.  Severe injury  (trauma) and other physical stressors.  Being under a lot of stress.  A medical illness.  What are the signs or symptoms? Fibromyalgia The main symptom of fibromyalgia is widespread pain and tenderness in your muscles. This can vary over time. Pain is sometimes described as stabbing, shooting, or burning. You may have tingling or numbness, too. You may also have sleep problems and fatigue. You may wake up feeling tired and groggy (fibro fog). Other symptoms may include:  Bowel and bladder problems.  Headaches.  Visual problems.  Problems with odors and noises.  Depression or mood changes.  Painful menstrual periods (dysmenorrhea).  Dry skin or eyes.  Myofascial pain syndrome Symptoms of myofascial pain syndrome include:  Tight, ropy bands of muscle.  Uncomfortable sensations in muscular areas, such as: ? Aching. ? Cramping. ? Burning. ? Numbness. ? Tingling. ? Muscle weakness.  Trouble moving certain muscles freely (range of motion).  How is this diagnosed? There are no specific tests to diagnose fibromyalgia or myofascial pain syndrome. Both can be hard to diagnose because their symptoms are common in many other conditions. Your health care provider may suspect one or both of these conditions based on your symptoms and medical history. Your health care provider will also do a physical exam. The key to diagnosing fibromyalgia is having pain, fatigue, and other symptoms for more than three months that cannot be explained by another condition. The key to diagnosing myofascial pain syndrome is finding trigger points in muscles that are tender and cause pain elsewhere in your body (referred pain). How is this treated? Treating fibromyalgia and myofascial   pain often requires a team of health care providers. This usually starts with your primary provider and a physical therapist. You may also find it helpful to work with alternative health care providers, such as massage  therapists or acupuncturists. Treatment for fibromyalgia may include medicines. This may include nonsteroidal anti-inflammatory drugs (NSAIDs), along with other medicines. Treatment for myofascial pain may also include:  NSAIDs.  Cooling and stretching of muscles.  Trigger point injections.  Sound wave (ultrasound) treatments to stimulate muscles.  Follow these instructions at home:  Take medicines only as directed by your health care provider.  Exercise as directed by your health care provider or physical therapist.  Try to avoid stressful situations.  Practice relaxation techniques to control your stress. You may want to try: ? Biofeedback. ? Visual imagery. ? Hypnosis. ? Muscle relaxation. ? Yoga. ? Meditation.  Talk to your health care provider about alternative treatments, such as acupuncture or massage treatment.  Maintain a healthy lifestyle. This includes eating a healthy diet and getting enough sleep.  Consider joining a support group.  Do not do activities that stress or strain your muscles. That includes repetitive motions and heavy lifting. Where to find more information:  National Fibromyalgia Association: www.fmaware.org  Arthritis Foundation: www.arthritis.org  American Chronic Pain Association: www.theacpa.org/condition/myofascial-pain Contact a health care provider if:  You have new symptoms.  Your symptoms get worse.  You have side effects from your medicines.  You have trouble sleeping.  Your condition is causing depression or anxiety. This information is not intended to replace advice given to you by your health care provider. Make sure you discuss any questions you have with your health care provider. Document Released: 09/23/2005 Document Revised: 02/29/2016 Document Reviewed: 06/29/2014 Elsevier Interactive Patient Education  2018 [employer]  

## 2018-01-12 NOTE — Progress Notes (Signed)
Subjective:  By signing my name below, I, Moises Blood, attest that this documentation has been prepared under the direction and in the presence of [name], MD. Electronically Signed: Moises Blood, [place]. 01/12/2018 , 5:35 PM .  Patient was seen in Room 1 .   Patient ID: Diane [name], female    DOB: [birth year], 62 y.o.   MRN: [id number] Chief Complaint  Patient presents with  . Pain    knees and shoulder, pain level 8/10, tried tylenol arthritis and norco for pain with no relief.  Hx per pt of knee/shoulder pain that Brigitte Pulse knows about.  Pt would like a cortisone injection if possible.  Using biofreeze also   HPI Diane [name] is a 62 y.o. female who presents to Primary Care at [facility] complaining of bilateral knee pain that radiates down her shin and into her ankles, severely at night where she can't touch the areas and difficulty to sleep. She also reports pain to the point where she is unable to walk. She also complains of shoulder pain, primarily in posterior back trapezius area and the transitional upper thoracic vertebrae. She does have some pain over the L>R acromial bursa. She rates her pain 8/10. She is right handed. She states it hurts the worst when she works a lot or when she lays on her shoulders. She locates her knee pain worst over medial aspect. She also states having wrist and hand pain, but not as much as shoulder pain. She saw Dr. Brien Few at [facility] and received injection into her back. She declines referral to pain management at this time, "maybe if it gets worse I'll consider it".   She's been taking Cymbalta with some relief. She states Fioricet gave her some relief, sometimes helped and other times didn't. She denies relief with tramadol. She denies being completely out of pain. Her stomach is doing better. She denies taking Vitamin D recently. She's been taking Vitamin B-complex and fish oil.   She was last seen 6 months ago. She was having left  leg sciatica with paraesthesias, waking her up from sleep at night. She also mentions sometimes her left hip would would also suddenly give way. She was complaining of diffuse pain in her shoulders, L>R, inhibiting her sleep. So she was restarted on duloxetine, which she was doing well on prior but had to stop due to cost. She had normal inflammatory levels and CK. Her cymbalta was increased to 60mg  QD after 6 weeks. She was prescribed Tramadol #270 no refills, hydrocodone #60 no refills, and Fioricet #80 no refills. 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 Her last prednisone course was in 11/2016.   Past Medical History:  Diagnosis Date  . Headache(784.0)   . Hypertension   . Multiple sites of bowel obstruction ([place]) 2005  . Nephrolithiasis   . Nephrolithiasis   . Uterine fibroid    Past Surgical History:  Procedure Laterality Date  . bowel obstruction    . CHOLECYSTECTOMY    . endometrial curretage  2005  . GANGLION CYST EXCISION Right 2001   hand  . HERNIA REPAIR Left    inguinal   Prior to Admission medications   Medication Sig Start Date End Date Taking? Authorizing Provider  amLODipine (NORVASC) 5 MG tablet TAKE 1 TABLET (5 MG TOTAL) BY MOUTH DAILY. 06/21/17  Yes Shawnee Knapp, MD  butalbital-acetaminophen-caffeine (FIORICET, ESGIC) [id number] MG tablet Take 1 tablet by mouth every 6 (  six) hours as needed for headache. 07/10/17  Yes Shawnee Knapp, MD  DULoxetine (CYMBALTA) 30 MG capsule Take 1 capsule (30 mg total) by mouth daily. 07/10/17  Yes Shawnee Knapp, MD  DULoxetine (CYMBALTA) 60 MG capsule Take 1 capsule (60 mg total) daily by mouth. 08/19/17  Yes Shawnee Knapp, MD  HYDROcodone-acetaminophen (NORCO/VICODIN) 5-325 MG tablet Take 1-2 tablets by mouth every 4 (four) hours as needed for moderate pain. 07/10/17  Yes Shawnee Knapp, MD  lactulose (CHRONULAC) 10 GM/15ML solution TAKE 30 MLS (20 G TOTAL) BY MOUTH 2 (TWO) TIMES DAILY. 06/05/17  Yes  Shawnee Knapp, MD  promethazine (PHENERGAN) 25 MG tablet Take 1 tablet (25 mg total) by mouth every 8 (eight) hours as needed for nausea or vomiting (migraine headache). 05/08/16  Yes Shawnee Knapp, MD  traMADol (ULTRAM) 50 MG tablet Take 1-2 tablets (50-100 mg total) by mouth every 6 (six) hours as needed. 07/10/17  Yes Shawnee Knapp, MD  Vitamin D, Ergocalciferol, (DRISDOL) 50000 units CAPS capsule TAKE 1 CAPSULE (50,000 UNITS TOTAL) BY MOUTH ONCE A WEEK. 11/13/16  Yes Jaynee Eagles, PA-C  Metoclopramide HCl 5 MG TBDP Take 1 tablet (5 mg total) by mouth 4 (four) times daily -  before meals and at bedtime. Patient not taking: Reported on 01/12/2018 07/10/17   Shawnee Knapp, MD  ondansetron (ZOFRAN-ODT) 8 MG disintegrating tablet Take 1 tablet (8 mg total) by mouth every 8 (eight) hours as needed for nausea or vomiting. Patient not taking: Reported on 01/12/2018 07/10/17   Shawnee Knapp, MD   Allergies  Allergen Reactions  . Topamax [Topiramate] Hives  . Dilaudid [Hydromorphone Hcl] Hives   Family History  Problem Relation Age of Onset  . Healthy Mother    Social History   Socioeconomic History  . Marital status: Single    Spouse name: [name]  . Number of children: 4  . Years of education: 109  . Highest education level: Not on file  Occupational History    Comment: [employer]  Social Needs  . Financial resource strain: Not on file  . Food insecurity:    Worry: Not on file    Inability: Not on file  . Transportation needs:    Medical: Not on file    Non-medical: Not on file  Tobacco Use  . Smoking status: Former Smoker    Last attempt to quit: 10/08/1987    Years since quitting: 30.2  . Smokeless tobacco: Never Used  Substance and Sexual Activity  . Alcohol use: Yes    Alcohol/week: 0.0 oz    Comment: 1 glass of wine occasionally  . Drug use: No  . Sexual activity: Not on file  Lifestyle  . Physical activity:    Days per week: Not on file    Minutes per session: Not on file  . Stress: Not on file    Relationships  . Social connections:    Talks on phone: Not on file    Gets together: Not on file    Attends religious service: Not on file    Active member of club or organization: Not on file    Attends meetings of clubs or organizations: Not on file    Relationship status: Not on file  Other Topics Concern  . Not on file  Social History Narrative   Pt lives at home with her husband, daughter, and 2 grandchildren.    She has four children    Husband needs high-level  of her care due to h/o brain tumor   Drives a city bus   [place] working outside in her yard and gardening.   drinks one cup of coffee daily.    Depression screen [facility] 2/9 01/12/2018 07/10/2017 05/08/2017 11/07/2016 10/04/2016  Decreased Interest 0 0 0 0 0  Down, Depressed, Hopeless 0 0 0 0 0  PHQ - 2 Score 0 0 0 0 0    Review of Systems  Constitutional: Negative for chills, fatigue, fever and unexpected weight change.  Respiratory: Negative for cough.   Gastrointestinal: Negative for constipation, diarrhea, nausea and vomiting.  Musculoskeletal: Positive for arthralgias and back pain.  Skin: Negative for rash and wound.  Neurological: Negative for dizziness, weakness and headaches.       Objective:   Physical Exam  Constitutional: She is oriented to person, place, and time. She appears well-developed and well-nourished. No distress.  HENT:  Head: Normocephalic and atraumatic.  Eyes: Pupils are equal, round, and reactive to light. EOM are normal.  Neck: Neck supple.  Cardiovascular: Normal rate.  Pulmonary/Chest: Effort normal. No respiratory distress.  Musculoskeletal: Normal range of motion.  Neurological: She is alert and oriented to person, place, and time.  Skin: Skin is warm and dry.  Psychiatric: She has a normal mood and affect. Her behavior is normal.  Nursing note and vitals reviewed.   Pulse 78   Resp 16   Ht 5\' 3"  (1.6 m)   Wt 167 lb (75.8 kg)   SpO2 98%   BMI 29.58 kg/m    Dg Ankle Complete  Right  Result Date: 01/12/2018 CLINICAL DATA:  Pain at the right ankle EXAM: RIGHT ANKLE - COMPLETE 3+ VIEW COMPARISON:  None. FINDINGS: There is no evidence of fracture, dislocation, or joint effusion. There is no evidence of arthropathy or other focal bone abnormality. Soft tissues are unremarkable. IMPRESSION: Negative. Electronically Signed   By: Donavan Foil M.D.   On: 01/12/2018 17:14   Dg Shoulder Left  Result Date: 01/12/2018 CLINICAL DATA:  Pain in left shoulder EXAM: LEFT SHOULDER - 2+ VIEW COMPARISON:  None. FINDINGS: There is no evidence of fracture or dislocation. There is no evidence of arthropathy or other focal bone abnormality. Soft tissues are unremarkable. IMPRESSION: Negative. Electronically Signed   By: Donavan Foil M.D.   On: 01/12/2018 17:17        Assessment & Plan:   1. Pain in joint involving right ankle and foot   2. Pain of both shoulder joints   3. Other insomnia   4. Fibromyalgia   5. Adrenal nodule ([place])   6. Medication monitoring encounter   7. Vitamin D deficiency     Orders Placed This Encounter  Procedures  . DG Ankle Complete Right    Standing Status:   Future    Number of Occurrences:   1    Standing Expiration Date:   01/12/2019    Order Specific Question:   Reason for Exam (SYMPTOM  OR DIAGNOSIS REQUIRED)    Answer:   pain of insiduous constant onset, worse at night    Order Specific Question:   Is the patient pregnant?    Answer:   No    Order Specific Question:   Preferred imaging location?    Answer:   External  . DG Shoulder Left    Standing Status:   Future    Number of Occurrences:   1    Standing Expiration Date:   01/12/2019  Order Specific Question:   Reason for Exam (SYMPTOM  OR DIAGNOSIS REQUIRED)    Answer:   pain of insiduous constant onset, worse at night    Order Specific Question:   Is the patient pregnant?    Answer:   No    Order Specific Question:   Preferred imaging location?    Answer:   External  . TSH  . VITAMIN  D 25 Hydroxy (Vit-D Deficiency, Fractures)  . Vitamin B12  . Sedimentation Rate    Meds ordered this encounter  Medications  . DULoxetine (CYMBALTA) 60 MG capsule    Sig: Take 2 capsules (120 mg total) by mouth daily.    Dispense:  180 capsule    Refill:  1  . traMADol (ULTRAM) 50 MG tablet    Sig: Take 1-2 tablets (50-100 mg total) by mouth every 6 (six) hours as needed.    Dispense:  270 tablet    Refill:  0  . DISCONTD: HYDROcodone-acetaminophen (NORCO) 10-325 MG tablet    Sig: Take 1 tablet by mouth every 4 (four) hours as needed (pain).    Dispense:  60 tablet    Refill:  0    For chronic pain  . cyclobenzaprine (FLEXERIL) 10 MG tablet    Sig: Take 1 tablet (10 mg total) by mouth at bedtime.    Dispense:  30 tablet    Refill:  3  . predniSONE (DELTASONE) 20 MG tablet    Sig: Take 3 tabs po qam x 3d, then 2 tabs po qam x 4d, then 1 tab po qam x 7d, then 1/2 tab po qam x 8d    Dispense:  28 tablet    Refill:  0    I personally performed the services described in this documentation, which was scribed in my presence. The recorded information has been reviewed and considered, and addended by me as needed.   [name], M.D.  Primary Care at [facility] [address] [place], [place] [address] [phone] phone [phone] fax  03/05/18 5:48 PM

## 2018-01-13 LAB — VITAMIN B12: Vitamin B-12: 1210 pg/mL (ref 232–1245)

## 2018-01-13 LAB — TSH: TSH: 1.79 u[IU]/mL (ref 0.450–4.500)

## 2018-01-13 LAB — VITAMIN D 25 HYDROXY (VIT D DEFICIENCY, FRACTURES): VIT D 25 HYDROXY: 11.4 ng/mL — AB (ref 30.0–100.0)

## 2018-01-13 LAB — SEDIMENTATION RATE: Sed Rate: 7 mm/hr (ref 0–40)

## 2018-02-01 ENCOUNTER — Other Ambulatory Visit: Payer: Self-pay | Admitting: Family Medicine

## 2018-02-07 ENCOUNTER — Other Ambulatory Visit: Payer: Self-pay | Admitting: Family Medicine

## 2018-02-10 ENCOUNTER — Other Ambulatory Visit: Payer: Self-pay | Admitting: Family Medicine

## 2018-02-10 NOTE — Telephone Encounter (Signed)
LOV 01/12/18 Dr. Brigitte Pulse Last refill 01/12/18  #28  0 refill

## 2018-02-10 NOTE — Telephone Encounter (Signed)
Copied from [place] [id number]. Topic: Quick Communication - Rx Refill/Question >> Feb 10, 2018  4:31 PM Ahmed Prima L wrote: Medication: HYDROcodone-acetaminophen (NORCO) 10-325 MG tablet Has the patient contacted their pharmacy? no (Agent: If no, request that the patient contact the pharmacy for the refill.) Preferred Pharmacy (with phone number or street name): [facility] [address]: Please be advised that RX refills may take up to 3 business days. We ask that you follow-up with your pharmacy.

## 2018-02-11 NOTE — Telephone Encounter (Signed)
Refill of Hydrocodone  LOV 01/12/18  Dr. Brigitte Pulse  [facility] 01/12/18  #60  0 refills  Pt has appt 03/05/18 with Dr. Brigitte Pulse

## 2018-02-14 MED ORDER — HYDROCODONE-ACETAMINOPHEN 10-325 MG PO TABS: 1.0000 | ORAL_TABLET | ORAL | 0 refills | Status: DC | PRN

## 2018-03-05 ENCOUNTER — Ambulatory Visit: Payer: BLUE CROSS/BLUE SHIELD | Admitting: Family Medicine

## 2018-03-05 ENCOUNTER — Encounter: Payer: Self-pay | Admitting: Family Medicine

## 2018-03-05 ENCOUNTER — Other Ambulatory Visit: Payer: Self-pay

## 2018-03-05 VITALS — BP 140/80 | HR 66 | Temp 97.7°F | Resp 18 | Ht 63.0 in | Wt 165.0 lb

## 2018-03-05 DIAGNOSIS — E559 Vitamin D deficiency, unspecified: Secondary | ICD-10-CM

## 2018-03-05 DIAGNOSIS — M353 Polymyalgia rheumatica: Secondary | ICD-10-CM

## 2018-03-05 DIAGNOSIS — F339 Major depressive disorder, recurrent, unspecified: Secondary | ICD-10-CM | POA: Diagnosis not present

## 2018-03-05 DIAGNOSIS — G43001 Migraine without aura, not intractable, with status migrainosus: Secondary | ICD-10-CM

## 2018-03-05 DIAGNOSIS — R110 Nausea: Secondary | ICD-10-CM | POA: Diagnosis not present

## 2018-03-05 DIAGNOSIS — M797 Fibromyalgia: Secondary | ICD-10-CM | POA: Diagnosis not present

## 2018-03-05 DIAGNOSIS — R7303 Prediabetes: Secondary | ICD-10-CM

## 2018-03-05 DIAGNOSIS — D72819 Decreased white blood cell count, unspecified: Secondary | ICD-10-CM

## 2018-03-05 DIAGNOSIS — Z79899 Other long term (current) drug therapy: Secondary | ICD-10-CM

## 2018-03-05 DIAGNOSIS — M2550 Pain in unspecified joint: Secondary | ICD-10-CM

## 2018-03-05 MED ORDER — PREDNISONE 20 MG PO TABS: ORAL_TABLET | ORAL | 1 refills | Status: DC

## 2018-03-05 MED ORDER — DULOXETINE HCL 60 MG PO CPEP: 60.0000 mg | ORAL_CAPSULE | Freq: Every day | ORAL | 1 refills | Status: DC

## 2018-03-05 MED ORDER — VITAMIN D (ERGOCALCIFEROL) 1.25 MG (50000 UNIT) PO CAPS: 50000.0000 [IU] | ORAL_CAPSULE | ORAL | 1 refills | Status: DC

## 2018-03-05 MED ORDER — METOCLOPRAMIDE HCL 5 MG PO TABS
5.0000 mg | ORAL_TABLET | Freq: Four times a day (QID) | ORAL | 0 refills | Status: DC | PRN
Start: 2018-03-05 — End: 2018-10-15

## 2018-03-05 NOTE — Patient Instructions (Addendum)
IF you received an x-ray today, you will receive an invoice from [facility]. Please contact [facility] at [phone] with questions or concerns regarding your invoice.   IF you received labwork today, you will receive an invoice from [place]. Please contact [facility] at [phone] with questions or concerns regarding your invoice.   Our billing staff will not be able to assist you with questions regarding bills from these companies.  You will be contacted with the lab results as soon as they are available. The fastest way to get your results is to activate your My Chart account. Instructions are located on the last page of this paperwork. If you have not heard from [place] regarding the results in 2 weeks, please contact this office.     Polymyalgia Rheumatica Polymyalgia rheumatica (PMR) is an inflammatory disorder that causes aching and stiffness in your muscles and joints. Sometimes, PMR leads to a more dangerous condition (temporal arteritis or giant cell arteritis), which can cause vision loss. What are the causes? The exact cause of PMR is not known. What increases the risk? This condition is more likely to develop in:  Females.  People who are 61 years of age or older.  Caucasians.  What are the signs or symptoms?  Pain and stiffness are the main symptoms of PMR. Symptoms may start slowly or suddenly. The symptoms:  May be worse after inactivity and in the morning.  May affect your: ? Hips, buttocks, and thighs. ? Neck, arms, and shoulders. This can make it hard to raise your arms above your head. ? Hands and wrists.  Other symptoms include:  Fever.  Tiredness.  Weakness.  Decreased appetite. This may lead to weight loss.  How is this diagnosed? This condition is diagnosed with a medical history and physical exam. You may need to see a health care provider who specializes in diseases of the joint, muscles, and bones (rheumatologist). You  may also have tests, including:  Blood tests.  X-rays.  How is this treated? PMR usually goes away without treatment, but it may take years for that to happen. In the meantime, your health care provider may recommend low-dose steroids to help manage your symptoms of pain and stiffness. Regular exercise and rest will also help your symptoms. Follow these instructions at home:  Take over-the-counter and prescription medicines only as told by your health care provider.  Make sure to get enough rest and sleep.  Eat a healthy and nutritious diet.  Try to exercise most days of the week. Ask your health care provider what type of exercise is best for you.  Keep all follow-up visits as told by your health are provider. This is important. Contact a health care provider if:  Your symptoms are not controlled with medicine.  You have side effects from steroids. These may include: ? Weight gain. ? Swelling. ? Insomnia. ? Mood changes. ? Bruising. ? High blood sugar readings, if you have diabetes. ? Higher than normal blood pressure readings, if you monitor your blood pressure. Get help right away if:  You develop symptoms of temporal arteritis, such as: ? A change in vision. ? Severe headache. ? Scalp pain. ? Jaw pain. This information is not intended to replace advice given to you by your health care provider. Make sure you discuss any questions you have with your health care provider. Document Released: 10/31/2004 Document Revised: 02/29/2016 Document Reviewed: 04/05/2015 Elsevier Interactive Patient Education  2018 [employer]. Prednisone tablets What is  this medicine? PREDNISONE (PRED ni sone) is a corticosteroid. It is commonly used to treat inflammation of the skin, joints, lungs, and other organs. Common conditions treated include asthma, allergies, and arthritis. It is also used for other conditions, such as blood disorders and diseases of the adrenal glands. This medicine  may be used for other purposes; ask your health care provider or pharmacist if you have questions. COMMON BRAND NAME(S): Deltasone, Predone, Sterapred, Sterapred DS What should I tell my health care provider before I take this medicine? They need to know if you have any of these conditions: -Cushing's syndrome -diabetes -glaucoma -heart disease -high blood pressure -infection (especially a virus infection such as chickenpox, cold sores, or herpes) -kidney disease -liver disease -mental illness -myasthenia gravis -osteoporosis -seizures -stomach or intestine problems -thyroid disease -an unusual or allergic reaction to lactose, prednisone, other medicines, foods, dyes, or preservatives -pregnant or trying to get pregnant -breast-feeding How should I use this medicine? Take this medicine by mouth with a glass of water. Follow the directions on the prescription label. Take this medicine with food. If you are taking this medicine once a day, take it in the morning. Do not take more medicine than you are told to take. Do not suddenly stop taking your medicine because you may develop a severe reaction. Your doctor will tell you how much medicine to take. If your doctor wants you to stop the medicine, the dose may be slowly lowered over time to avoid any side effects. Talk to your pediatrician regarding the use of this medicine in children. Special care may be needed. Overdosage: If you think you have taken too much of this medicine contact a poison control center or emergency room at once. NOTE: This medicine is only for you. Do not share this medicine with others. What if I miss a dose? If you miss a dose, take it as soon as you can. If it is almost time for your next dose, talk to your doctor or health care professional. You may need to miss a dose or take an extra dose. Do not take double or extra doses without advice. What may interact with this medicine? Do not take this medicine with any  of the following medications: -metyrapone -mifepristone This medicine may also interact with the following medications: -aminoglutethimide -amphotericin B -aspirin and aspirin-like medicines -barbiturates -certain medicines for diabetes, like glipizide or glyburide -cholestyramine -cholinesterase inhibitors -cyclosporine -digoxin -diuretics -ephedrine -female hormones, like estrogens and birth control pills -isoniazid -ketoconazole -NSAIDS, medicines for pain and inflammation, like ibuprofen or naproxen -phenytoin -rifampin -toxoids -vaccines -warfarin This list may not describe all possible interactions. Give your health care provider a list of all the medicines, herbs, non-prescription drugs, or dietary supplements you use. Also tell them if you smoke, drink alcohol, or use illegal drugs. Some items may interact with your medicine. What should I watch for while using this medicine? Visit your doctor or health care professional for regular checks on your progress. If you are taking this medicine over a prolonged period, carry an identification card with your name and address, the type and dose of your medicine, and your doctor's name and address. This medicine may increase your risk of getting an infection. Tell your doctor or health care professional if you are around anyone with measles or chickenpox, or if you develop sores or blisters that do not heal properly. If you are going to have surgery, tell your doctor or health care professional that you have taken  this medicine within the last twelve months. Ask your doctor or health care professional about your diet. You may need to lower the amount of salt you eat. This medicine may affect blood sugar levels. If you have diabetes, check with your doctor or health care professional before you change your diet or the dose of your diabetic medicine. What side effects may I notice from receiving this medicine? Side effects that you  should report to your doctor or health care professional as soon as possible: -allergic reactions like skin rash, itching or hives, swelling of the face, lips, or tongue -changes in emotions or moods -changes in vision -depressed mood -eye pain -fever or chills, cough, sore throat, pain or difficulty passing urine -increased thirst -swelling of ankles, feet Side effects that usually do not require medical attention (report to your doctor or health care professional if they continue or are bothersome): -confusion, excitement, restlessness -headache -nausea, vomiting -skin problems, acne, thin and shiny skin -trouble sleeping -weight gain This list may not describe all possible side effects. Call your doctor for medical advice about side effects. You may report side effects to FDA at 1-800-FDA-1088. Where should I keep my medicine? Keep out of the reach of children. Store at room temperature between 15 and 30 degrees C (59 and 86 degrees F). Protect from light. Keep container tightly closed. Throw away any unused medicine after the expiration date. NOTE: This sheet is a summary. It may not cover all possible information. If you have questions about this medicine, talk to your doctor, pharmacist, or health care provider.  2018 Elsevier/Gold Standard (2011-05-09 10:57:14)

## 2018-03-05 NOTE — Progress Notes (Signed)
Subjective:  By signing my name below, I, Diane [name], attest that this documentation has been prepared under the direction and in the presence of Diane Cheadle, MD. Electronically Signed: [name], [place]. 03/05/2018 , 6:25 PM .  Patient was seen in Room 1 .   Patient ID: [name], female    DOB: Oct 20, 1955, 62 y.o.   MRN: [id number] Chief Complaint  Patient presents with  . Knee Pain    Pt would like injections for pain in both knees  . Follow-up  . Shoulder Pain    Pt would like injections for both shoulders.   HPI Diane [name] is a 62 y.o. female who presents to Primary Care at [facility] for follow up of arthralgias. She requests injections for both shoulders and both knees.   Chronic MSK Pain/Fibromyglia/Polymyalgia Rheumatica I last saw patient 7 weeks prior, stating shoulder pain 8/10 (left shoulder worse than right, though right handed; seemed to locate to acromial bursa) and was using tylenol arthritis and hydrocodone without relief, as well as biofreeze. She had received injections from Dr. Brien Few from [facility] previously. She failed tramadol. Her left shoulder xray was normal, and right ankle xray was normal.  Increased her hydrocodone from 5mg  to 10mg , and increased her duloxetine from 60mg  to 120mg . Placed patient on prednisone 60mg  3 week taper with cyclobenzaprine qhs for possible diagnosis of polymyalgia rheumatica with therapeutic trial of steroids.   Patient states she felt a huge amount better while taking prednisone, but pain started to return when she reached 1 tablet a day. She noticed dramatic improvement in her arms and legs pains on the very first day that she started taking the prednisone. She rates her pain 7/10 today. Her symptoms are worse in the evening, because they keep her up at night. She notes it's painful when she gets started, but after moving around, her symptoms start to improve. She's been taking the Flexeril at night, but  with minimal improvement in sleep. She still has tramadol at home taking 2 tablets qhs with flexeril, and takes half hydrocodone at work. She was taking tylenol-arthritis but she ran out.  She denies taking Vitamin D supplement.   She mentions she was having sweats and chills even after prednisone course.   HTN She takes amlodipine 5 qd without much swelling in her calves, but noticed some swelling in her knees.   Chronic recurrent HAs Her headaches have been improving this week.   Chronic nausea and constipation She also felt nauseated for about 2 weeks with the prednisone, and so she stopped taking fish oil and Vitamin B supplements. She's been taking lactulose about 3 times a week, although still somewhat constipated.  She's been taking phenergan BID last week but without relief; so she went back to ondansetron. She was prescribed metoclopramide in Oct 2018. She mentions her bowels do feel stuck sometimes; hasn't had colonoscopy done as GI told her it could be very complicated and so she is NOT a good candidate for this screening procedure as at high risk for complications. She's had past surgical history for bowel obstruction.   Past Medical History:  Diagnosis Date  . Headache(784.0)   . Hypertension   . Multiple sites of bowel obstruction ([place]) 2005  . Nephrolithiasis   . Nephrolithiasis   . Uterine fibroid    Past Surgical History:  Procedure Laterality Date  . bowel obstruction    . CHOLECYSTECTOMY    . endometrial curretage  2005  .  GANGLION CYST EXCISION Right 2001   hand  . HERNIA REPAIR Left    inguinal   Prior to Admission medications   Medication Sig Start Date End Date Taking? Authorizing Provider  amLODipine (NORVASC) 5 MG tablet TAKE 1 TABLET (5 MG TOTAL) BY MOUTH DAILY. 06/21/17   Shawnee Knapp, MD  butalbital-acetaminophen-caffeine (FIORICET, ESGIC) [phone] MG tablet Take 1 tablet by mouth every 6 (six) hours as needed for headache. 07/10/17   Shawnee Knapp, MD    cyclobenzaprine (FLEXERIL) 10 MG tablet Take 1 tablet (10 mg total) by mouth at bedtime. 01/12/18   Shawnee Knapp, MD  DULoxetine (CYMBALTA) 60 MG capsule Take 2 capsules (120 mg total) by mouth daily. 01/12/18   Shawnee Knapp, MD  HYDROcodone-acetaminophen [facility]) 10-325 MG tablet Take 1 tablet by mouth every 4 (four) hours as needed (pain). 02/14/18   Shawnee Knapp, MD  lactulose (CHRONULAC) 10 GM/15ML solution TAKE 30 MLS (20 G TOTAL) BY MOUTH 2 (TWO) TIMES DAILY. 06/05/17   Shawnee Knapp, MD  predniSONE (DELTASONE) 20 MG tablet Take 3 tabs po qam x 3d, then 2 tabs po qam x 4d, then 1 tab po qam x 7d, then 1/2 tab po qam x 8d 01/12/18   Shawnee Knapp, MD  promethazine (PHENERGAN) 25 MG tablet Take 1 tablet (25 mg total) by mouth every 8 (eight) hours as needed for nausea or vomiting (migraine headache). 05/08/16   Shawnee Knapp, MD  traMADol (ULTRAM) 50 MG tablet Take 1-2 tablets (50-100 mg total) by mouth every 6 (six) hours as needed. 01/12/18   Shawnee Knapp, MD  Vitamin D, Ergocalciferol, (DRISDOL) 50000 units CAPS capsule TAKE 1 CAPSULE (50,000 UNITS TOTAL) BY MOUTH ONCE A WEEK. 11/13/16   Jaynee Eagles, PA-C   Allergies  Allergen Reactions  . Topamax [Topiramate] Hives  . Dilaudid [Hydromorphone Hcl] Hives   Family History  Problem Relation Age of Onset  . Healthy Mother    Social History   Socioeconomic History  . Marital status: Single    Spouse name: Diane [name]  . Number of children: 4  . Years of education: 58  . Highest education level: Not on file  Occupational History    Comment: [employer]  Social Needs  . Financial resource strain: Not on file  . Food insecurity:    Worry: Not on file    Inability: Not on file  . Transportation needs:    Medical: Not on file    Non-medical: Not on file  Tobacco Use  . Smoking status: Former Smoker    Last attempt to quit: 10/08/1987    Years since quitting: 30.4  . Smokeless tobacco: Never Used  Substance and Sexual Activity  . Alcohol use: Yes    Alcohol/week:  0.0 oz    Comment: 1 glass of wine occasionally  . Drug use: No  . Sexual activity: Not on file  Lifestyle  . Physical activity:    Days per week: Not on file    Minutes per session: Not on file  . Stress: Not on file  Relationships  . Social connections:    Talks on phone: Not on file    Gets together: Not on file    Attends religious service: Not on file    Active member of club or organization: Not on file    Attends meetings of clubs or organizations: Not on file    Relationship status: Not on file  Other Topics Concern  .  Not on file  Social History Narrative   Pt lives at home with her husband, daughter, and 2 grandchildren.    She has four children    Husband needs high-level of her care due to h/o brain tumor   Drives a city bus   [place] working outside in her yard and gardening.   drinks one cup of coffee daily.    Depression screen [facility] 2/9 03/05/2018 01/12/2018 07/10/2017 05/08/2017 11/07/2016  Decreased Interest 0 0 0 0 0  Down, Depressed, Hopeless 0 0 0 0 0  PHQ - 2 Score 0 0 0 0 0    Review of Systems  Constitutional: Positive for chills and fatigue. Negative for fever and unexpected weight change.  Respiratory: Negative for cough and chest tightness.   Cardiovascular: Negative for chest pain, palpitations and leg swelling.  Gastrointestinal: Positive for nausea. Negative for constipation, diarrhea and vomiting.  Genitourinary: Positive for difficulty urinating (hesitency but minimal). Negative for decreased urine volume, dysuria, pelvic pain and urgency.  Musculoskeletal: Positive for arthralgias, back pain, gait problem, joint swelling, myalgias, neck pain and neck stiffness.  Skin: Negative for rash and wound.  Neurological: Negative for dizziness, weakness and headaches.       Objective:   Physical Exam  Constitutional: She is oriented to person, place, and time. She appears well-developed and well-nourished. No distress.  HENT:  Head: Normocephalic and  atraumatic.  Eyes: Pupils are equal, round, and reactive to light. EOM are normal.  Neck: Neck supple.  Cardiovascular: Normal rate.  Pulmonary/Chest: Effort normal. No respiratory distress.  Musculoskeletal: Normal range of motion.  Neurological: She is alert and oriented to person, place, and time.  Skin: Skin is warm and dry.  Psychiatric: She has a normal mood and affect. Her behavior is normal.  Nursing note and vitals reviewed.   BP 140/80 (BP Location: Left Arm, Patient Position: Sitting, Cuff Size: Normal)   Pulse 66   Temp 97.7 F (36.5 C) (Oral)   Resp 18   Ht 5\' 3"  (1.6 m)   Wt 165 lb (74.8 kg)   SpO2 97%   BMI 29.23 kg/m      Assessment & Plan:    1. Polymyalgia rheumatica (HCC) - IL-6 and fibrinogen have both been shown to correlate with disease activity of PMR. Recheck labs.  However, encouraging pt had such a marked improvement "felt amazing" on the prednisone so restart course but then after gets down to 20 will start very slow wean off over the next several months. She has never had a rheum eval prior due to normal labs but may be indicated if has trouble getting off of prednisone.  2. Arthralgia of multiple joints - saw Dr. Brien Few at [facility] many yrs ago  3. Vitamin D deficiency - restart high dose sent to pharm today  4. Nausea without vomiting - with chronic constipation - sounds like gastroparesis sxs so due trial of reglan - never took rx given prior  5. Prediabetes -  Lab Results  Component Value Date   HGBA1C 5.5 03/05/2018   HGBA1C 5.7 (H) 05/08/2017    6. Migraine without aura and with status migrainosus, not intractable   7. Recurrent major depressive disorder, remission status unspecified ([place])   8. Fibromyalgia - diagnosed by PCP in 2013 when her joint pain was not responding to ortho inj and pain worsening.  Never seen rheum prior. no benefit since increasing cymbalta and concern could be exacerbating GI sxs so decrease cymbalta back  to  60 (from 120). Cont flexeril 10 qhs  9. Polypharmacy   10. Leukopenia, unspecified type     Orders Placed This Encounter  Procedures  . CK  . CBC with Differential/Platelet  . Comprehensive metabolic panel  . Lipase  . Sedimentation Rate  . C-reactive protein  . ANA w/Reflex if Positive  . Hemoglobin A1c  . Rheumatoid factor  . CYCLIC CITRUL PEPTIDE ANTIBODY, IGG/IGA  . Interleukin-6, Plasma  . Fibrinogen  . POC Hemoccult Bld/Stl (3-Cd Home Screen)    Standing Status:   Future    Standing Expiration Date:   03/06/2019    Meds ordered this encounter  Medications  . metoCLOPramide (REGLAN) 5 MG tablet    Sig: Take 1 tablet (5 mg total) by mouth every 6 (six) hours as needed for nausea. Try to take beofre meals and at bedtime if needed    Dispense:  120 tablet    Refill:  0  . Vitamin D, Ergocalciferol, (DRISDOL) 50000 units CAPS capsule    Sig: Take 1 capsule (50,000 Units total) by mouth once a week.    Dispense:  12 capsule    Refill:  1  . predniSONE (DELTASONE) 20 MG tablet    Sig: Take 3 tabs po qam x 3d, then 2 tabs po qam x 3d, then 1 tab po qam until follow-up    Dispense:  40 tablet    Refill:  1  . DULoxetine (CYMBALTA) 60 MG capsule    Sig: Take 1 capsule (60 mg total) by mouth daily.    Dispense:  90 capsule    Refill:  1    D/C prior rx for 120mg  qd - sig change.  Pt does not need currently. Just wanted to have change of sig on file w/ pharm for future refills    I personally performed the services described in this documentation, which was scribed in my presence. The recorded information has been reviewed and considered, and addended by me as needed.   Diane [name], M.D.  Primary Care at [facility] [address] [place], [place] [address] [phone] phone [phone] fax  03/20/18 1:30 PM

## 2018-03-10 LAB — COMPREHENSIVE METABOLIC PANEL
A/G RATIO: 1.6 (ref 1.2–2.2)
ALK PHOS: 96 IU/L (ref 39–117)
ALT: 10 IU/L (ref 0–32)
AST: 23 IU/L (ref 0–40)
Albumin: 4.6 g/dL (ref 3.6–4.8)
BILIRUBIN TOTAL: 0.3 mg/dL (ref 0.0–1.2)
BUN/Creatinine Ratio: 14 (ref 12–28)
BUN: 9 mg/dL (ref 8–27)
CHLORIDE: 103 mmol/L (ref 96–106)
CO2: 24 mmol/L (ref 20–29)
Calcium: 9.5 mg/dL (ref 8.7–10.3)
Creatinine, Ser: 0.63 mg/dL (ref 0.57–1.00)
GFR calc Af Amer: 113 mL/min/{1.73_m2} (ref >59)
GFR calc non Af Amer: 98 mL/min/{1.73_m2} (ref >59)
GLUCOSE: 90 mg/dL (ref 65–99)
Globulin, Total: 2.8 g/dL (ref 1.5–4.5)
POTASSIUM: 4.1 mmol/L (ref 3.5–5.2)
Sodium: 143 mmol/L (ref 134–144)
Total Protein: 7.4 g/dL (ref 6.0–8.5)

## 2018-03-10 LAB — CBC WITH DIFFERENTIAL/PLATELET
BASOS ABS: 0 10*3/uL (ref 0.0–0.2)
BASOS: 1 %
EOS (ABSOLUTE): 0.1 10*3/uL (ref 0.0–0.4)
Eos: 2 %
Hematocrit: 40.1 % (ref 34.0–46.6)
Hemoglobin: 13.5 g/dL (ref 11.1–15.9)
Immature Grans (Abs): 0 10*3/uL (ref 0.0–0.1)
Immature Granulocytes: 0 %
Lymphocytes Absolute: 1.5 10*3/uL (ref 0.7–3.1)
Lymphs: 43 %
MCH: 28.6 pg (ref 26.6–33.0)
MCHC: 33.7 g/dL (ref 31.5–35.7)
MCV: 85 fL (ref 79–97)
MONOS ABS: 0.3 10*3/uL (ref 0.1–0.9)
Monocytes: 7 %
NEUTROS ABS: 1.5 10*3/uL (ref 1.4–7.0)
NEUTROS PCT: 47 %
PLATELETS: 295 10*3/uL (ref 150–450)
RBC: 4.72 x10E6/uL (ref 3.77–5.28)
RDW: 14.2 % (ref 12.3–15.4)
WBC: 3.4 10*3/uL (ref 3.4–10.8)

## 2018-03-10 LAB — SEDIMENTATION RATE: SED RATE: 26 mm/h (ref 0–40)

## 2018-03-10 LAB — LIPASE: LIPASE: 14 U/L (ref 14–72)

## 2018-03-10 LAB — CK: CK TOTAL: 217 U/L — AB (ref 24–173)

## 2018-03-10 LAB — C-REACTIVE PROTEIN: CRP: 7.1 mg/L — AB (ref 0.0–4.9)

## 2018-03-10 LAB — INTERLEUKIN-6, PLASMA: INTERLEUKIN-6, PLASMA: 9.6 pg/mL (ref 0.0–12.2)

## 2018-03-10 LAB — HEMOGLOBIN A1C
Est. average glucose Bld gHb Est-mCnc: 111 mg/dL
HEMOGLOBIN A1C: 5.5 % (ref 4.8–5.6)

## 2018-03-10 LAB — ANA W/REFLEX IF POSITIVE: ANA: NEGATIVE

## 2018-03-10 LAB — CYCLIC CITRUL PEPTIDE ANTIBODY, IGG/IGA: CYCLIC CITRULLIN PEPTIDE AB: 2 U (ref 0–19)

## 2018-03-10 LAB — RHEUMATOID FACTOR

## 2018-03-18 ENCOUNTER — Other Ambulatory Visit: Payer: Self-pay | Admitting: Family Medicine

## 2018-03-18 NOTE — Telephone Encounter (Signed)
butaldital-acetaminophen-caffiene refill Last Refill:07/10/17 # 80 No RF Last OV: 01/12/18 PCP: [name] MD Pharmacy:[facility]

## 2018-03-26 ENCOUNTER — Emergency Department: Payer: No Typology Code available for payment source

## 2018-03-26 ENCOUNTER — Other Ambulatory Visit: Payer: Self-pay

## 2018-03-26 ENCOUNTER — Emergency Department
Admission: EM | Admit: 2018-03-26 | Discharge: 2018-03-26 | Disposition: A | Discharge status: Home / Self Care | Payer: No Typology Code available for payment source | Attending: Emergency Medicine | Admitting: Emergency Medicine

## 2018-03-26 ENCOUNTER — Encounter: Payer: Self-pay | Admitting: Emergency Medicine

## 2018-03-26 DIAGNOSIS — Y929 Unspecified place or not applicable: Secondary | ICD-10-CM | POA: Diagnosis not present

## 2018-03-26 DIAGNOSIS — Z87891 Personal history of nicotine dependence: Secondary | ICD-10-CM | POA: Insufficient documentation

## 2018-03-26 DIAGNOSIS — W228XXA Striking against or struck by other objects, initial encounter: Secondary | ICD-10-CM | POA: Insufficient documentation

## 2018-03-26 DIAGNOSIS — I10 Essential (primary) hypertension: Secondary | ICD-10-CM | POA: Insufficient documentation

## 2018-03-26 DIAGNOSIS — Y990 Civilian activity done for income or pay: Secondary | ICD-10-CM | POA: Diagnosis not present

## 2018-03-26 DIAGNOSIS — S0003XA Contusion of scalp, initial encounter: Secondary | ICD-10-CM | POA: Insufficient documentation

## 2018-03-26 DIAGNOSIS — S62102A Fracture of unspecified carpal bone, left wrist, initial encounter for closed fracture: Secondary | ICD-10-CM | POA: Diagnosis not present

## 2018-03-26 DIAGNOSIS — S0990XA Unspecified injury of head, initial encounter: Secondary | ICD-10-CM | POA: Diagnosis present

## 2018-03-26 DIAGNOSIS — Z79899 Other long term (current) drug therapy: Secondary | ICD-10-CM | POA: Diagnosis not present

## 2018-03-26 DIAGNOSIS — S5012XA Contusion of left forearm, initial encounter: Secondary | ICD-10-CM | POA: Insufficient documentation

## 2018-03-26 DIAGNOSIS — Y9389 Activity, other specified: Secondary | ICD-10-CM | POA: Insufficient documentation

## 2018-03-26 DIAGNOSIS — S52592A Other fractures of lower end of left radius, initial encounter for closed fracture: Secondary | ICD-10-CM | POA: Diagnosis not present

## 2018-03-26 MED ORDER — HYDROMORPHONE HCL 1 MG/ML IJ SOLN
1.0000 mg | Freq: Once | INTRAMUSCULAR | Status: AC
  Administered 2018-03-26: 1 mg via INTRAMUSCULAR
  Filled 2018-03-26: qty 1

## 2018-03-26 MED ORDER — NAPROXEN 500 MG PO TABS: 500.0000 mg | ORAL_TABLET | Freq: Two times a day (BID) | ORAL | Status: DC

## 2018-03-26 MED ORDER — OXYCODONE-ACETAMINOPHEN 5-325 MG PO TABS: 1.0000 | ORAL_TABLET | Freq: Once | ORAL | Status: DC

## 2018-03-26 MED ORDER — TRAMADOL HCL 50 MG PO TABS: 50.0000 mg | ORAL_TABLET | Freq: Two times a day (BID) | ORAL | 0 refills | Status: DC | PRN

## 2018-03-26 MED ORDER — KETOROLAC TROMETHAMINE 60 MG/2ML IM SOLN
60.0000 mg | Freq: Once | INTRAMUSCULAR | Status: AC
  Administered 2018-03-26: 60 mg via INTRAMUSCULAR
  Filled 2018-03-26: qty 2

## 2018-03-26 MED ORDER — IBUPROFEN 600 MG PO TABS: 600.0000 mg | ORAL_TABLET | Freq: Once | ORAL | Status: DC

## 2018-03-26 NOTE — ED Provider Notes (Signed)
[facility] Emergency Department Provider Note   ____________________________________________   First MD Initiated Contact with Patient 03/26/18 1305     (approximate)  I have reviewed the triage vital signs and the nursing notes.   HISTORY  Chief Complaint Wrist Pain    HPI Diane [name] is a 62 y.o. female patient complain of severe headache and left forearm/wrist pain secondary to falling off a cart at work.  Patient was seen at urgent care clinic and only had UDS performed.  Due to complaint of headache status post fall she was sent to get a CT scan and x-rays of her right wrist to the emergency room.  Patient rates the headache as a 8/10.  Patient denies vision disturbance or vertigo.  Patient rates her wrist pain as a 5/10.  Patient wrist was Ace wrapped at the urgent care clinic.  No palliative measures given for pain.  Past Medical History:  Diagnosis Date  . Headache(784.0)   . Hypertension   . Multiple sites of bowel obstruction ([place]) 2005  . Nephrolithiasis   . Nephrolithiasis   . Uterine fibroid     Patient Active Problem List   Diagnosis Date Noted  . Adrenal nodule ([place]) 09/27/2016  . Fibromyalgia 02/01/2016  . Arthritis, senescent 02/01/2016  . Vitamin D deficiency 02/01/2016  . Depression 08/31/2015  . Leukopenia 08/31/2015  . Polypharmacy 08/31/2015  . Migraine headache without aura 12/31/2012  . Hypertension 12/31/2012  . Insomnia 12/31/2012    Past Surgical History:  Procedure Laterality Date  . bowel obstruction    . CHOLECYSTECTOMY    . endometrial curretage  2005  . GANGLION CYST EXCISION Right 2001   hand  . HERNIA REPAIR Left    inguinal    Prior to Admission medications   Medication Sig Start Date End Date Taking? Authorizing Provider  amLODipine (NORVASC) 5 MG tablet TAKE 1 TABLET (5 MG TOTAL) BY MOUTH DAILY. 06/21/17   Shawnee Knapp, MD  butalbital-acetaminophen-caffeine (FIORICET, ESGIC) [phone] MG  tablet Take 1 tablet by mouth every 6 (six) hours as needed for headache. 07/10/17   Shawnee Knapp, MD  butalbital-acetaminophen-caffeine (FIORICET, ESGIC) [phone] MG tablet TAKE 1 TABLET BY MOUTH EVERY 6 HOURS AS NEEDED HEADACHE 03/20/18   Shawnee Knapp, MD  cyclobenzaprine (FLEXERIL) 10 MG tablet Take 1 tablet (10 mg total) by mouth at bedtime. 01/12/18   Shawnee Knapp, MD  DULoxetine (CYMBALTA) 60 MG capsule Take 1 capsule (60 mg total) by mouth daily. 03/05/18   Shawnee Knapp, MD  HYDROcodone-acetaminophen [facility]) 10-325 MG tablet Take 1 tablet by mouth every 4 (four) hours as needed (pain). 02/14/18   Shawnee Knapp, MD  lactulose (CHRONULAC) 10 GM/15ML solution TAKE 30 MLS (20 G TOTAL) BY MOUTH 2 (TWO) TIMES DAILY. 06/05/17   Shawnee Knapp, MD  metoCLOPramide (REGLAN) 5 MG tablet Take 1 tablet (5 mg total) by mouth every 6 (six) hours as needed for nausea. Try to take beofre meals and at bedtime if needed 03/05/18   Shawnee Knapp, MD  naproxen (NAPROSYN) 500 MG tablet Take 1 tablet (500 mg total) by mouth 2 (two) times daily with a meal. 03/26/18   Sable Feil, PA-C  predniSONE (DELTASONE) 20 MG tablet Take 3 tabs po qam x 3d, then 2 tabs po qam x 3d, then 1 tab po qam until follow-up 03/05/18   Shawnee Knapp, MD  traMADol (ULTRAM) 50 MG tablet Take 1-2 tablets (50-100  mg total) by mouth every 6 (six) hours as needed. 01/12/18   Shawnee Knapp, MD  traMADol (ULTRAM) 50 MG tablet Take 1 tablet (50 mg total) by mouth every 12 (twelve) hours as needed. 03/26/18   Sable Feil, PA-C  Vitamin D, Ergocalciferol, (DRISDOL) 50000 units CAPS capsule Take 1 capsule (50,000 Units total) by mouth once a week. 03/05/18   Shawnee Knapp, MD    Allergies Topamax [topiramate] and Dilaudid [hydromorphone hcl]  Family History  Problem Relation Age of Onset  . Healthy Mother     Social History Social History   Tobacco Use  . Smoking status: Former Smoker    Last attempt to quit: 10/08/1987    Years since quitting: 30.4  .  Smokeless tobacco: Never Used  Substance Use Topics  . Alcohol use: Yes    Alcohol/week: 0.0 oz    Comment: 1 glass of wine occasionally  . Drug use: No    Review of Systems  Constitutional: No fever/chills Eyes: No visual changes. ENT: No sore throat. Cardiovascular: Denies chest pain. Respiratory: Denies shortness of breath. Gastrointestinal: No abdominal pain.  No nausea, no vomiting.  No diarrhea.  No constipation. Genitourinary: Negative for dysuria. Musculoskeletal: Left wrist pain.. Skin: Negative for rash. Neurological: Positive for headaches, but denies focal weakness or numbness. Endocrine:Hypertension Allergic/Immunilogical: Topamax and Dilaudid.  ____________________________________________   PHYSICAL EXAM:  VITAL SIGNS: ED Triage Vitals  Enc Vitals Group     BP 03/26/18 1245 (!) 149/86     Pulse Rate 03/26/18 1245 65     Resp 03/26/18 1245 18     Temp 03/26/18 1245 98.6 F (37 C)     Temp Source 03/26/18 1245 Oral     SpO2 03/26/18 1245 98 %     Weight 03/26/18 1245 163 lb (73.9 kg)     Height 03/26/18 1245 5\' 5"  (1.651 m)     Head Circumference --      Peak Flow --      Pain Score 03/26/18 1251 8     Pain Loc --      Pain Edu? --      Excl. in [place]? --    Constitutional: Alert and oriented.  Moderate distress and anxious.   Eyes: Conjunctivae are normal. PERRL. EOMI. Head: Right Parietal edema.   Nose: No congestion/rhinnorhea. Neck: No stridor.  No cervical spine tenderness to palpation. Cardiovascular: Normal rate, regular rhythm. Grossly normal heart sounds.  Good peripheral circulation. Respiratory: Normal respiratory effort.  No retractions. Lungs CTAB. Musculoskeletal: No obvious deformity to the left wrist.  Mild edema.  Moderate guarding palpation distal radius Neurologic:  Normal speech and language. No gross focal neurologic deficits are appreciated. No gait instability. Skin:  Skin is warm, dry and intact. No rash noted. Psychiatric:  Mood and affect are normal. Speech and behavior are normal.  ____________________________________________   LABS (all labs ordered are listed, but only abnormal results are displayed)  Labs Reviewed - No data to display ____________________________________________  EKG   ____________________________________________  RADIOLOGY    Official radiology report(s): Dg Forearm Left  Result Date: 03/26/2018 CLINICAL DATA:  Fall. EXAM: LEFT FOREARM - 2 VIEW COMPARISON:  Left wrist x-rays from same day. FINDINGS: Predominantly transverse fracture of the distal radius metaphysis again noted. No additional fracture. The elbow is unremarkable. Bone mineralization is normal. Soft tissues are unremarkable. IMPRESSION: Unchanged distal radius fracture. No additional acute osseous abnormality. Electronically Signed   By: Orville Govern.D.  On: 03/26/2018 14:40   Dg Wrist Complete Left  Result Date: 03/26/2018 CLINICAL DATA:  Fall. EXAM: LEFT WRIST - COMPLETE 3+ VIEW COMPARISON:  No recent. FINDINGS: Angulated fracture of the distal radial metaphysis noted. Fracture slightly angulated posteriorly. No other focal bony abnormality identified. Patient is in a cast. IMPRESSION: Slightly angulated fracture of the distal left radial metaphysis. Electronically Signed   By: Marcello Moores  Register   On: 03/26/2018 13:26   Ct Head Wo Contrast  Result Date: 03/26/2018 CLINICAL DATA:  Fall at work.  Trauma to head. EXAM: CT HEAD WITHOUT CONTRAST TECHNIQUE: Contiguous axial images were obtained from the base of the skull through the vertex without intravenous contrast. COMPARISON:  None. FINDINGS: Brain: No acute infarct, hemorrhage, or mass lesion is present. The ventricles are of normal size. No significant extraaxial fluid collection is present. No significant white matter disease is present. The brainstem and cerebellum are normal. Vascular: No hyperdense vessel or unexpected calcification. Skull: Right frontal and  parietal scalp soft tissue swelling is present at the vertex. There is no underlying fracture. Calvarium is intact. No focal lytic or blastic lesions are present. Sinuses/Orbits: The paranasal sinuses and the mastoid air cells are clear. Globes and orbits are within normal limits. IMPRESSION: 1. Right parietal and frontal soft tissue swelling at the vertex without underlying fracture. 2. Normal CT appearance of the head. No acute intracranial abnormality. Electronically Signed   By: San Morelle M.D.   On: 03/26/2018 14:06    ____________________________________________   PROCEDURES  Procedure(s) performed: None  Procedures  Critical Care performed: No  ____________________________________________   INITIAL IMPRESSION / ASSESSMENT AND PLAN / ED COURSE  As part of my medical decision making, I reviewed the following data within the [place]    Patient presents with headache secondary to scalp contusion.  Patient left forearm and wrist pain secondary to a distal radial fracture.  Discussed CT and x-ray findings with patient.  Patient given discharge care instruction.  Patient placed in a splint and sling.  Patient advised take medication as directed.  Patient advised to follow orthopedic for definitive evaluation and treatment of the distal left radial fracture.      ____________________________________________   FINAL CLINICAL IMPRESSION(S) / ED DIAGNOSES  Final diagnoses:  Contusion of scalp, initial encounter  Contusion of left forearm, initial encounter  Closed fracture of left wrist, initial encounter     ED Discharge Orders        Ordered    traMADol (ULTRAM) 50 MG tablet  Every 12 hours PRN     03/26/18 1414    naproxen (NAPROSYN) 500 MG tablet  2 times daily with meals     03/26/18 1414       Note:  This document was prepared using Dragon voice recognition software and may include unintentional dictation errors.    Sable Feil,  PA-C 03/26/18 1457    Earleen Newport, MD 03/26/18 [phone]

## 2018-03-26 NOTE — ED Notes (Signed)
Esign not workin pt verbalized discharge instructions

## 2018-03-26 NOTE — Discharge Instructions (Signed)
Wear splint and sling until evaluation by orthopedics.

## 2018-03-26 NOTE — ED Triage Notes (Signed)
Pt reports that she was at work at [employer] and fell off a cart, she fell on the steps then on to the floor, she tried catching her self with her wrists. Pt was at urgent care and got UDS, she is here for xrays.

## 2018-04-10 ENCOUNTER — Encounter (HOSPITAL_COMMUNITY): Payer: Self-pay | Admitting: *Deleted

## 2018-04-10 ENCOUNTER — Other Ambulatory Visit: Payer: Self-pay

## 2018-04-11 ENCOUNTER — Ambulatory Visit (HOSPITAL_COMMUNITY)
Admission: RE | Admit: 2018-04-11 | Discharge: 2018-04-11 | Disposition: A | Discharge status: Home / Self Care | Payer: No Typology Code available for payment source | Source: Ambulatory Visit | Attending: Orthopedic Surgery | Admitting: Orthopedic Surgery

## 2018-04-11 ENCOUNTER — Encounter (HOSPITAL_COMMUNITY)
Admission: RE | Disposition: A | Discharge status: Home / Self Care | Payer: Self-pay | Source: Ambulatory Visit | Attending: Orthopedic Surgery

## 2018-04-11 DIAGNOSIS — Z538 Procedure and treatment not carried out for other reasons: Secondary | ICD-10-CM | POA: Diagnosis not present

## 2018-04-11 DIAGNOSIS — Y9289 Other specified places as the place of occurrence of the external cause: Secondary | ICD-10-CM | POA: Diagnosis not present

## 2018-04-11 DIAGNOSIS — Y990 Civilian activity done for income or pay: Secondary | ICD-10-CM | POA: Diagnosis not present

## 2018-04-11 DIAGNOSIS — W1789XA Other fall from one level to another, initial encounter: Secondary | ICD-10-CM | POA: Diagnosis not present

## 2018-04-11 DIAGNOSIS — S52502A Unspecified fracture of the lower end of left radius, initial encounter for closed fracture: Secondary | ICD-10-CM | POA: Diagnosis not present

## 2018-04-11 HISTORY — DX: Other complications of anesthesia, initial encounter: T88.59XA

## 2018-04-11 HISTORY — DX: Unspecified osteoarthritis, unspecified site: M19.90

## 2018-04-11 HISTORY — DX: Fibromyalgia: M79.7

## 2018-04-11 HISTORY — DX: Adverse effect of unspecified anesthetic, initial encounter: T41.45XA

## 2018-04-11 SURGERY — OPEN REDUCTION INTERNAL FIXATION (ORIF) DISTAL RADIUS FRACTURE
Anesthesia: Choice | Laterality: Left

## 2018-04-11 MED ORDER — LACTATED RINGERS IV SOLN: INTRAVENOUS | Status: DC

## 2018-04-11 MED ORDER — CEFAZOLIN SODIUM-DEXTROSE 2-4 GM/100ML-% IV SOLN
INTRAVENOUS | Status: AC
  Filled 2018-04-11: qty 100

## 2018-04-11 MED ORDER — CHLORHEXIDINE GLUCONATE 4 % EX LIQD: 60.0000 mL | Freq: Once | CUTANEOUS | Status: DC

## 2018-04-11 MED ORDER — CEFAZOLIN SODIUM-DEXTROSE 2-4 GM/100ML-% IV SOLN: 2.0000 g | INTRAVENOUS | Status: DC

## 2018-04-11 SURGICAL SUPPLY — 45 items
BANDAGE ACE 3X5.8 VEL STRL LF (GAUZE/BANDAGES/DRESSINGS) ×2 IMPLANT
BANDAGE ACE 4X5 VEL STRL LF (GAUZE/BANDAGES/DRESSINGS) ×2 IMPLANT
BLADE CLIPPER SURG (BLADE) IMPLANT
BNDG CMPR 9X4 STRL LF SNTH (GAUZE/BANDAGES/DRESSINGS) ×1
BNDG ESMARK 4X9 LF (GAUZE/BANDAGES/DRESSINGS) ×2 IMPLANT
BNDG GAUZE ELAST 4 BULKY (GAUZE/BANDAGES/DRESSINGS) ×2 IMPLANT
CANISTER SUCT 3000ML PPV (MISCELLANEOUS) ×2 IMPLANT
CORDS BIPOLAR (ELECTRODE) ×2 IMPLANT
COVER SURGICAL LIGHT HANDLE (MISCELLANEOUS) ×2 IMPLANT
CUFF TOURNIQUET SINGLE 18IN (TOURNIQUET CUFF) ×2 IMPLANT
CUFF TOURNIQUET SINGLE 24IN (TOURNIQUET CUFF) IMPLANT
DECANTER SPIKE VIAL GLASS SM (MISCELLANEOUS) ×2 IMPLANT
DRAPE OEC MINIVIEW 54X84 (DRAPES) ×2 IMPLANT
DRAPE SURG 17X11 SM STRL (DRAPES) ×2 IMPLANT
DRSG ADAPTIC 3X8 NADH LF (GAUZE/BANDAGES/DRESSINGS) ×2 IMPLANT
GAUZE SPONGE 4X4 12PLY STRL (GAUZE/BANDAGES/DRESSINGS) ×2 IMPLANT
GAUZE SPONGE 4X4 16PLY XRAY LF (GAUZE/BANDAGES/DRESSINGS) ×2 IMPLANT
GAUZE XEROFORM 5X9 LF (GAUZE/BANDAGES/DRESSINGS) ×2 IMPLANT
GLOVE BIOGEL PI IND STRL 8.5 (GLOVE) ×1 IMPLANT
GLOVE BIOGEL PI INDICATOR 8.5 (GLOVE) ×1
GLOVE SURG ORTHO 8.0 STRL STRW (GLOVE) ×2 IMPLANT
GOWN STRL REUS W/ TWL LRG LVL3 (GOWN DISPOSABLE) ×1 IMPLANT
GOWN STRL REUS W/ TWL XL LVL3 (GOWN DISPOSABLE) ×1 IMPLANT
GOWN STRL REUS W/TWL LRG LVL3 (GOWN DISPOSABLE) ×2
GOWN STRL REUS W/TWL XL LVL3 (GOWN DISPOSABLE) ×1
KIT BASIN OR (CUSTOM PROCEDURE TRAY) ×2 IMPLANT
KIT TURNOVER KIT B (KITS) ×2 IMPLANT
NEEDLE HYPO 25X1 1.5 SAFETY (NEEDLE) ×2 IMPLANT
NS IRRIG 1000ML POUR BTL (IV SOLUTION) ×2 IMPLANT
PACK ORTHO EXTREMITY (CUSTOM PROCEDURE TRAY) ×2 IMPLANT
PAD ARMBOARD 7.5X6 YLW CONV (MISCELLANEOUS) ×4 IMPLANT
PAD CAST 4YDX4 CTTN HI CHSV (CAST SUPPLIES) ×1 IMPLANT
PADDING CAST COTTON 4X4 STRL (CAST SUPPLIES) ×1
SOAP 2 % CHG 4 OZ (WOUND CARE) ×2 IMPLANT
SPONGE LAP 4X18 RFD (DISPOSABLE) ×2 IMPLANT
SUT PROLENE 4 0 PS 2 18 (SUTURE) IMPLANT
SUT VIC AB 2-0 CT1 27 (SUTURE)
SUT VIC AB 2-0 CT1 TAPERPNT 27 (SUTURE) IMPLANT
SUT VICRYL 4-0 PS2 18IN ABS (SUTURE) IMPLANT
SYR CONTROL 10ML LL (SYRINGE) IMPLANT
TOWEL OR 17X24 6PK STRL BLUE (TOWEL DISPOSABLE) ×2 IMPLANT
TOWEL OR 17X26 10 PK STRL BLUE (TOWEL DISPOSABLE) ×2 IMPLANT
TUBE CONNECTING 12X1/4 (SUCTIONS) ×2 IMPLANT
WATER STERILE IRR 1000ML POUR (IV SOLUTION) ×2 IMPLANT
YANKAUER SUCT BULB TIP NO VENT (SUCTIONS) IMPLANT

## 2018-04-11 NOTE — H&P (Signed)
WORK COMP AUTHORIZATION NOT COMPLETED SURGERY POSTPONED UNTIL AUTHORIZATION PT VOICED UNDERSTANDING OF REASON AND PLAN

## 2018-04-11 NOTE — Progress Notes (Signed)
Patient left without assistance from staff.

## 2018-04-30 ENCOUNTER — Encounter: Payer: Self-pay | Admitting: Family Medicine

## 2018-04-30 ENCOUNTER — Ambulatory Visit: Payer: BLUE CROSS/BLUE SHIELD | Admitting: Family Medicine

## 2018-04-30 ENCOUNTER — Other Ambulatory Visit: Payer: Self-pay

## 2018-04-30 VITALS — BP 132/88 | HR 83 | Temp 98.7°F | Ht 65.0 in | Wt 162.6 lb

## 2018-04-30 DIAGNOSIS — R232 Flushing: Secondary | ICD-10-CM

## 2018-04-30 DIAGNOSIS — E559 Vitamin D deficiency, unspecified: Secondary | ICD-10-CM | POA: Diagnosis not present

## 2018-04-30 DIAGNOSIS — M353 Polymyalgia rheumatica: Secondary | ICD-10-CM | POA: Diagnosis not present

## 2018-04-30 DIAGNOSIS — R748 Abnormal levels of other serum enzymes: Secondary | ICD-10-CM | POA: Diagnosis not present

## 2018-04-30 NOTE — Patient Instructions (Signed)
Myofascial Pain Syndrome and Fibromyalgia Myofascial pain syndrome and fibromyalgia are both pain disorders. This pain may be felt mainly in your muscles.  Myofascial pain syndrome: ? Always has trigger points or tender points in the muscle that will cause pain when pressed. The pain may come and go. ? Usually affects your neck, upper back, and shoulder areas. The pain often radiates into your arms and hands.  Fibromyalgia: ? Has muscle pains and tenderness that come and go. ? Is often associated with fatigue and sleep disturbances. ? Has trigger points. ? Tends to be long-lasting (chronic), but is not life-threatening.  Fibromyalgia and myofascial pain are not the same. However, they often occur together. If you have both conditions, each can make the other worse. Both are common and can cause enough pain and fatigue to make day-to-day activities difficult. What are the causes? The exact causes of fibromyalgia and myofascial pain are not known. People with certain gene types may be more likely to develop fibromyalgia. Some factors can be triggers for both conditions, such as:  Spine disorders.  Arthritis.  Severe injury (trauma) and other physical stressors.  Being under a lot of stress.  A medical illness.  What are the signs or symptoms? Fibromyalgia The main symptom of fibromyalgia is widespread pain and tenderness in your muscles. This can vary over time. Pain is sometimes described as stabbing, shooting, or burning. You may have tingling or numbness, too. You may also have sleep problems and fatigue. You may wake up feeling tired and groggy (fibro fog). Other symptoms may include:  Bowel and bladder problems.  Headaches.  Visual problems.  Problems with odors and noises.  Depression or mood changes.  Painful menstrual periods (dysmenorrhea).  Dry skin or eyes.  Myofascial pain syndrome Symptoms of myofascial pain syndrome include:  Tight, ropy bands of  muscle.  Uncomfortable sensations in muscular areas, such as: ? Aching. ? Cramping. ? Burning. ? Numbness. ? Tingling. ? Muscle weakness.  Trouble moving certain muscles freely (range of motion).  How is this diagnosed? There are no specific tests to diagnose fibromyalgia or myofascial pain syndrome. Both can be hard to diagnose because their symptoms are common in many other conditions. Your health care provider may suspect one or both of these conditions based on your symptoms and medical history. Your health care provider will also do a physical exam. The key to diagnosing fibromyalgia is having pain, fatigue, and other symptoms for more than three months that cannot be explained by another condition. The key to diagnosing myofascial pain syndrome is finding trigger points in muscles that are tender and cause pain elsewhere in your body (referred pain). How is this treated? Treating fibromyalgia and myofascial pain often requires a team of health care providers. This usually starts with your primary provider and a physical therapist. You may also find it helpful to work with alternative health care providers, such as massage therapists or acupuncturists. Treatment for fibromyalgia may include medicines. This may include nonsteroidal anti-inflammatory drugs (NSAIDs), along with other medicines. Treatment for myofascial pain may also include:  NSAIDs.  Cooling and stretching of muscles.  Trigger point injections.  Sound wave (ultrasound) treatments to stimulate muscles.  Follow these instructions at home:  Take medicines only as directed by your health care provider.  Exercise as directed by your health care provider or physical therapist.  Try to avoid stressful situations.  Practice relaxation techniques to control your stress. You may want to try: ? Biofeedback. ? Visual   imagery. ? Hypnosis. ? Muscle relaxation. ? Yoga. ? Meditation.  Talk to your health care provider  about alternative treatments, such as acupuncture or massage treatment.  Maintain a healthy lifestyle. This includes eating a healthy diet and getting enough sleep.  Consider joining a support group.  Do not do activities that stress or strain your muscles. That includes repetitive motions and heavy lifting. Where to find more information:  National Fibromyalgia Association: www.fmaware.org  Arthritis Foundation: www.arthritis.org  American Chronic Pain Association: www.theacpa.org/condition/myofascial-pain Contact a health care provider if:  You have new symptoms.  Your symptoms get worse.  You have side effects from your medicines.  You have trouble sleeping.  Your condition is causing depression or anxiety. This information is not intended to replace advice given to you by your health care provider. Make sure you discuss any questions you have with your health care provider. Document Released: 09/23/2005 Document Revised: 02/29/2016 Document Reviewed: 06/29/2014 Elsevier Interactive Patient Education  2018 Elsevier Inc.  

## 2018-04-30 NOTE — Progress Notes (Signed)
Subjective:  By signing my name below, I, Moises Blood, attest that this documentation has been prepared under the direction and in the presence of [name], MD. Electronically Signed: Moises Blood, [place]. 04/30/2018 , 6:18 PM .  Patient was seen in Room 4 .   Patient ID: Diane [name], female    DOB: 1956-01-03, 62 y.o.   MRN: [id number] Chief Complaint  Patient presents with  . Follow-up    pt states her shoulders are still bothering her    HPI Diane [name] is a 62 y.o. female who presents to Primary Care at [facility] for follow up of her shoulders. Patient fell off of a ladder at work, and broke her left wrist. She had wrist surgery done by Dr. Caralyn Guile about 3 weeks ago. She's currently out of work. She saw the PA yesterday, informed patient is doing well and will have cast removed in 2 weeks. Then, she will continue with physical therapy. For pain, she was prescribed oxycodone taking it once to twice a day. She's also been taking prednisone, which has been doing well for her knees, but still having shoulder pain.   Patient describes shoulder pain towards top of shoulder, up to her neck over the top of her deltoids. She states pain is present even when she's at rest, and hurts more when she's laying down on her shoulders. When she was working and lifting at work, it makes her pain worse. She usually sleep on her sides, but due to pain, she's been trying to sleep on her back more. She denies prior injections into her shoulders; has received them in her knees. She is still taking Vitamin D supplement.   She's been sweating more recently during the day in the past 6 months, over her face and scalp. She went through menopause in her 48s, and had hot flashes then. She denies any changes in skin, or her hair, breast tenderness, vaginal discharge, or vaginal bleeding. She hasn't drank much water today.   She hasn't been exercising, since being out of work. She has been gardening  though.   Past Medical History:  Diagnosis Date  . Arthritis   . Complication of anesthesia    "can't stand mask over face." " It makes my head hurt"  . Fibromyalgia   . Headache(784.0)    Migraines  . Hypertension   . Multiple sites of bowel obstruction ([place]) 2005  . Uterine fibroid    Past Surgical History:  Procedure Laterality Date  . CHOLECYSTECTOMY    . COLON RESECTION  1990  . endometrial curretage  2005  . GANGLION CYST EXCISION Right 2001   hand  . HERNIA REPAIR Left    inguinal  . TUBAL LIGATION     Prior to Admission medications   Medication Sig Start Date End Date Taking? Authorizing Provider  amLODipine (NORVASC) 5 MG tablet TAKE 1 TABLET (5 MG TOTAL) BY MOUTH DAILY. 06/21/17   Shawnee Knapp, MD  butalbital-acetaminophen-caffeine (FIORICET, ESGIC) [phone] MG tablet Take 1 tablet by mouth every 6 (six) hours as needed for headache. Patient not taking: Reported on 04/10/2018 07/10/17   Shawnee Knapp, MD  butalbital-acetaminophen-caffeine (FIORICET, ESGIC) [phone] MG tablet TAKE 1 TABLET BY MOUTH EVERY 6 HOURS AS NEEDED HEADACHE Patient taking differently: TAKE 2 TABLET BY MOUTH EVERY 6 HOURS AS NEEDED HEADACHE 03/20/18   Shawnee Knapp, MD  cyclobenzaprine (FLEXERIL) 10 MG tablet Take 1 tablet (10 mg total) by mouth at bedtime. 01/12/18  Shawnee Knapp, MD  DULoxetine (CYMBALTA) 60 MG capsule Take 1 capsule (60 mg total) by mouth daily. 03/05/18   Shawnee Knapp, MD  HYDROcodone-acetaminophen [facility]) 10-325 MG tablet Take 1 tablet by mouth every 4 (four) hours as needed (pain). 02/14/18   Shawnee Knapp, MD  lactulose (CHRONULAC) 10 GM/15ML solution TAKE 30 MLS (20 G TOTAL) BY MOUTH 2 (TWO) TIMES DAILY. Patient not taking: Reported on 04/10/2018 06/05/17   Shawnee Knapp, MD  metoCLOPramide (REGLAN) 5 MG tablet Take 1 tablet (5 mg total) by mouth every 6 (six) hours as needed for nausea. Try to take beofre meals and at bedtime if needed 03/05/18   Shawnee Knapp, MD  naproxen (NAPROSYN) 500 MG  tablet Take 1 tablet (500 mg total) by mouth 2 (two) times daily with a meal. 03/26/18   Sable Feil, PA-C  predniSONE (DELTASONE) 20 MG tablet Take 3 tabs po qam x 3d, then 2 tabs po qam x 3d, then 1 tab po qam until follow-up 03/05/18   Shawnee Knapp, MD  traMADol (ULTRAM) 50 MG tablet Take 1-2 tablets (50-100 mg total) by mouth every 6 (six) hours as needed. Patient not taking: Reported on 04/10/2018 01/12/18   Shawnee Knapp, MD  traMADol (ULTRAM) 50 MG tablet Take 1 tablet (50 mg total) by mouth every 12 (twelve) hours as needed. 03/26/18   Sable Feil, PA-C  Vitamin D, Ergocalciferol, (DRISDOL) 50000 units CAPS capsule Take 1 capsule (50,000 Units total) by mouth once a week. 03/05/18   Shawnee Knapp, MD   Allergies  Allergen Reactions  . Topamax [Topiramate] Hives  . Dilaudid [Hydromorphone Hcl] Hives   Family History  Problem Relation Age of Onset  . Healthy Mother    Social History   Socioeconomic History  . Marital status: Married    Spouse name: [name]  . Number of children: 4  . Years of education: 27  . Highest education level: Not on file  Occupational History    Comment: [employer]  Social Needs  . Financial resource strain: Not on file  . Food insecurity:    Worry: Not on file    Inability: Not on file  . Transportation needs:    Medical: Not on file    Non-medical: Not on file  Tobacco Use  . Smoking status: Former Smoker    Years: 8.00    Last attempt to quit: 10/08/1987    Years since quitting: 30.5  . Smokeless tobacco: Never Used  Substance and Sexual Activity  . Alcohol use: Yes    Alcohol/week: 0.0 oz    Comment: 1 glass of wine occasionally  . Drug use: No  . Sexual activity: Not on file  Lifestyle  . Physical activity:    Days per week: Not on file    Minutes per session: Not on file  . Stress: Not on file  Relationships  . Social connections:    Talks on phone: Not on file    Gets together: Not on file    Attends religious service: Not on file     Active member of club or organization: Not on file    Attends meetings of clubs or organizations: Not on file    Relationship status: Not on file  Other Topics Concern  . Not on file  Social History Narrative   Pt lives at home with her husband, daughter, and 2 grandchildren.    She has four children  Husband needs high-level of her care due to h/o brain tumor   Drives a city bus   [place] working outside in her yard and gardening.   drinks one cup of coffee daily.    Depression screen [facility] 2/9 03/05/2018 01/12/2018 07/10/2017 05/08/2017 11/07/2016  Decreased Interest 0 0 0 0 0  Down, Depressed, Hopeless 0 0 0 0 0  PHQ - 2 Score 0 0 0 0 0     Review of Systems  Constitutional: Negative for chills, fatigue, fever and unexpected weight change.  Respiratory: Negative for cough.   Gastrointestinal: Negative for constipation, diarrhea, nausea and vomiting.  Musculoskeletal: Positive for arthralgias.  Skin: Negative for rash and wound.  Neurological: Negative for dizziness, weakness and headaches.       Objective:   Physical Exam  Constitutional: She is oriented to person, place, and time. She appears well-developed and well-nourished. No distress.  HENT:  Head: Normocephalic and atraumatic.  Eyes: Pupils are equal, round, and reactive to light. EOM are normal.  Neck: Neck supple.  Cardiovascular: Normal rate.  Pulmonary/Chest: Effort normal. No respiratory distress.  Musculoskeletal: Normal range of motion.  Left shoulder: tender over the clavicle, approximately 1 inch from the most medial aspect  Neurological: She is alert and oriented to person, place, and time.  Skin: Skin is warm and dry.  Psychiatric: She has a normal mood and affect. Her behavior is normal.  Nursing note and vitals reviewed.   BP 132/88 (BP Location: Right Arm, Patient Position: Sitting, Cuff Size: Normal)   Pulse 83   Temp 98.7 F (37.1 C) (Oral)   Ht 5\' 5"  (1.651 m)   Wt 162 lb 9.6 oz (73.8 kg)   SpO2  97%   BMI 27.06 kg/m      Assessment & Plan:   1. Polymyalgia rheumatica (HCC)   2. Elevated CK   3. Hot flashes   4. Vitamin D deficiency     Orders Placed This Encounter  Procedures  . Sedimentation rate  . CK     I personally performed the services described in this documentation, which was scribed in my presence. The recorded information has been reviewed and considered, and addended by me as needed.   [name], M.D.  Primary Care at [facility] [address] [place], [place] [address] [phone] phone [phone] fax  06/01/18 4:42 PM

## 2018-05-01 ENCOUNTER — Other Ambulatory Visit: Payer: Self-pay | Admitting: Family Medicine

## 2018-05-01 LAB — CK: CK TOTAL: 81 U/L (ref 24–173)

## 2018-05-01 LAB — SEDIMENTATION RATE

## 2018-05-04 ENCOUNTER — Other Ambulatory Visit: Payer: Self-pay | Admitting: Family Medicine

## 2018-05-04 NOTE — Telephone Encounter (Signed)
LOV  04/30/18 Dr. Brigitte Pulse Last refill 03/05/18 # 40 with 1 refill

## 2018-05-05 ENCOUNTER — Telehealth: Payer: Self-pay | Admitting: Family Medicine

## 2018-05-05 NOTE — Telephone Encounter (Signed)
Copied from [place] [id number]. Topic: General - Other >> May 05, 2018  8:17 AM Yvette Rack wrote: Reason for CRM: pt calling stating that Dr Brigitte Pulse didn't send in her traMADol (ULTRAM) 50 MG tablet   and that she was going to put her on Gabapentin pt states that she would like to get put back on the HYDROcodone-acetaminophen (NORCO) 10-325 MG tablet because she is in a lot of pain pt was in the office on 04-30-18

## 2018-05-15 NOTE — Telephone Encounter (Signed)
Patient calling to check on these medications being sent to the pharmacy. Please advise. States that she is in a lot of pain.

## 2018-05-19 NOTE — Telephone Encounter (Signed)
Tried contacting pt via phone, no answer and no voicemail to leave message.  Tramadol refused and prednisone refused. In crm pt advises she is in a lot of pain and wants to be put back on norco and is in a lot of pain.  Tried contacting pt to advise if she is in a lot of pain she will need to come in for ov with shaw in order to get any pain medication.  Last visit 04/30/18. DGaddy, CMA

## 2018-06-01 ENCOUNTER — Other Ambulatory Visit: Payer: Self-pay

## 2018-06-01 ENCOUNTER — Ambulatory Visit: Payer: BLUE CROSS/BLUE SHIELD | Admitting: Family Medicine

## 2018-06-01 ENCOUNTER — Encounter: Payer: Self-pay | Admitting: Family Medicine

## 2018-06-01 VITALS — BP 132/82 | HR 81 | Temp 98.0°F | Resp 16 | Ht 64.96 in | Wt 169.2 lb

## 2018-06-01 DIAGNOSIS — Z79891 Long term (current) use of opiate analgesic: Secondary | ICD-10-CM

## 2018-06-01 DIAGNOSIS — M25562 Pain in left knee: Secondary | ICD-10-CM | POA: Diagnosis not present

## 2018-06-01 MED ORDER — GABAPENTIN 300 MG PO CAPS: ORAL_CAPSULE | ORAL | 3 refills | Status: DC

## 2018-06-01 MED ORDER — HYDROCODONE-ACETAMINOPHEN 10-325 MG PO TABS: 1.0000 | ORAL_TABLET | ORAL | 0 refills | Status: DC | PRN

## 2018-06-01 MED ORDER — TRIAMCINOLONE ACETONIDE 40 MG/ML IJ SUSP: 40.0000 mg | Freq: Once | INTRAMUSCULAR | Status: DC

## 2018-06-01 NOTE — Patient Instructions (Addendum)
If you have lab work done today you will be contacted with your lab results within the next 2 weeks.  If you have not heard from [place] then please contact us. The fastest way to get your results is to register for My Chart.   IF you received an x-ray today, you will receive an invoice from [facility]. Please contact [facility] at [phone] with questions or concerns regarding your invoice.   IF you received labwork today, you will receive an invoice from [place]. Please contact [facility] at [phone] with questions or concerns regarding your invoice.   Our billing staff will not be able to assist you with questions regarding bills from these companies.  You will be contacted with the lab results as soon as they are available. The fastest way to get your results is to activate your My Chart account. Instructions are located on the last page of this paperwork. If you have not heard from [place] regarding the results in 2 weeks, please contact this office.     Knee Injection, Care After Refer to this sheet in the next few weeks. These instructions provide you with information about caring for yourself after your procedure. Your health care provider may also give you more specific instructions. Your treatment has been planned according to current medical practices, but problems sometimes occur. Call your health care provider if you have any problems or questions after your procedure. What can I expect after the procedure? After the procedure, it is common to have:  Soreness.  Warmth.  Swelling.  You may have more pain, swelling, and warmth than you did before the injection. This reaction may last for about one day. Follow these instructions at home: Bathing  If you were given a bandage (dressing), keep it dry until your health care provider says it can be removed. Ask your health care provider when you can start showering or taking a bath. Managing pain, stiffness,  and swelling  If directed, apply ice to the injection area: ? Put ice in a plastic bag. ? Place a towel between your skin and the bag. ? Leave the ice on for 20 minutes, 2-3 times per day.  Do not apply heat to your knee.  Raise the injection area above the level of your heart while you are sitting or lying down. Activity  Avoid strenuous activities for as long as directed by your health care provider. Ask your health care provider when you can return to your normal activities. General instructions  Take medicines only as directed by your health care provider.  Do not take aspirin or other over-the-counter medicines unless your health care provider says you can.  Check your injection site every day for signs of infection. Watch for: ? Redness, swelling, or pain. ? Fluid, blood, or pus.  Follow your health care provider's instructions about dressing changes and removal. Contact a health care provider if:  You have symptoms at your injection site that last longer than two days after your procedure.  You have redness, swelling, or pain in your injection area.  You have fluid, blood, or pus coming from your injection site.  You have warmth in your injection area.  You have a fever.  Your pain is not controlled with medicine. Get help right away if:  Your knee turns very red.  Your knee becomes very swollen.  Your knee pain is severe. This information is not intended to replace advice given to you by your health  care provider. Make sure you discuss any questions you have with your health care provider. Document Released: 10/14/2014 Document Revised: 05/29/2016 Document Reviewed: 08/03/2014 Elsevier Interactive Patient Education  2018 [employer].  Knee Injection A knee injection is a procedure to get medicine into your knee joint. Your health care provider puts a needle into the joint and injects medicine with an attached syringe. The injected medicine may relieve the  pain, swelling, and stiffness of arthritis. The injected medicine may also help to lubricate and cushion your knee joint. You may need more than one injection. Tell a health care provider about:  Any allergies you have.  All medicines you are taking, including vitamins, herbs, eye drops, creams, and over-the-counter medicines.  Any problems you or family members have had with anesthetic medicines.  Any blood disorders you have.  Any surgeries you have had.  Any medical conditions you have. What are the risks? Generally, this is a safe procedure. However, problems may occur, including:  Infection.  Bleeding.  Worsening symptoms.  Damage to the area around your knee.  Allergic reaction to any of the medicines.  Skin reactions from repeated injections.  What happens before the procedure?  Ask your health care provider about changing or stopping your regular medicines. This is especially important if you are taking diabetes medicines or blood thinners.  Plan to have someone take you home after the procedure. What happens during the procedure?  You will sit or lie down in a position for your knee to be treated.  The skin over your kneecap will be cleaned with a germ-killing solution (antiseptic).  You will be given a medicine that numbs the area (local anesthetic). You may feel some stinging.  After your knee becomes numb, you will have a second injection. This is the medicine. This needle is carefully placed between your kneecap and your knee. The medicine is injected into the joint space.  At the end of the procedure, the needle will be removed.  A bandage (dressing) may be placed over the injection site. The procedure may vary among health care providers and hospitals. What happens after the procedure?  You may have to move your knee through its full range of motion. This helps to get all of the medicine into your joint space.  Your blood pressure, heart rate,  breathing rate, and blood oxygen level will be monitored often until the medicines you were given have worn off.  You will be watched to make sure that you do not have a reaction to the injected medicine. This information is not intended to replace advice given to you by your health care provider. Make sure you discuss any questions you have with your health care provider. Document Released: 12/15/2006 Document Revised: 02/23/2016 Document Reviewed: 08/03/2014 Elsevier Interactive Patient Education  2018 [employer].

## 2018-06-01 NOTE — Progress Notes (Signed)
Subjective:    Patient ID: VELECIA OVITT, female    DOB: 07/12/1956, 62 y.o.   MRN: [id number] Chief Complaint  Patient presents with  . Knee Pain    left knee pain x 3 weeks     HPI   Prednisone ddidn't help shoulder but helped knee a lot - left knee sometimes swollen burns and radiates all the way up to her left glut - mainly in the medial distal area - can't even stand to have knees touch eachother.  Left knee xray 2.5 yrs ago showed DJD Lt>Rt in medial component.   Broke her wrist and has an appointment with Dr. Apolonio Schneiders tomorrow - [facility] [place].   Past Medical History:  Diagnosis Date  . Arthritis   . Complication of anesthesia    "can't stand mask over face." " It makes my head hurt"  . Fibromyalgia   . Headache(784.0)    Migraines  . Hypertension   . Multiple sites of bowel obstruction ([facility]) 2005  . Uterine fibroid    Past Surgical History:  Procedure Laterality Date  . CHOLECYSTECTOMY    . COLON RESECTION  1990  . endometrial curretage  2005  . GANGLION CYST EXCISION Right 2001   hand  . HERNIA REPAIR Left    inguinal  . TUBAL LIGATION     Current Outpatient Medications on File Prior to Visit  Medication Sig Dispense Refill  . amLODipine (NORVASC) 5 MG tablet TAKE 1 TABLET (5 MG TOTAL) BY MOUTH DAILY. 90 tablet 3  . butalbital-acetaminophen-caffeine (FIORICET, ESGIC) 50-325-40 MG tablet TAKE 1 TABLET BY MOUTH EVERY 6 HOURS AS NEEDED HEADACHE (Patient taking differently: TAKE 2 TABLET BY MOUTH EVERY 6 HOURS AS NEEDED HEADACHE) 80 tablet 0  . cyclobenzaprine (FLEXERIL) 10 MG tablet Take 1 tablet (10 mg total) by mouth at bedtime. 30 tablet 3  . DULoxetine (CYMBALTA) 60 MG capsule Take 1 capsule (60 mg total) by mouth daily. 90 capsule 1  . ergocalciferol (VITAMIN D2) 50000 units capsule ergocalciferol (vitamin D2) 50,000 unit capsule  TAKE ONE CAPSULE BY MOUTH ONE TIME PER WEEK    . lactulose (CHRONULAC) 10 GM/15ML solution TAKE 30 MLS (20 G TOTAL)  BY MOUTH 2 (TWO) TIMES DAILY. 473 mL 0  . metoCLOPramide (REGLAN) 5 MG tablet Take 1 tablet (5 mg total) by mouth every 6 (six) hours as needed for nausea. Try to take beofre meals and at bedtime if needed 120 tablet 0  . ondansetron (ZOFRAN-ODT) 8 MG disintegrating tablet ondansetron 8 mg disintegrating tablet  TAKE 1 TABLET EVERY 8 HOURS AS NEEDED FOR NAUSEA AND VOMITING    . Vitamin D, Ergocalciferol, (DRISDOL) 50000 units CAPS capsule Take 1 capsule (50,000 Units total) by mouth once a week. 12 capsule 1   No current facility-administered medications on file prior to visit.    Allergies  Allergen Reactions  . Topamax [Topiramate] Hives  . Dilaudid [Hydromorphone Hcl] Hives   Family History  Problem Relation Age of Onset  . Healthy Mother    Social History   Socioeconomic History  . Marital status: Married    Spouse name: [name]  . Number of children: 4  . Years of education: 65  . Highest education level: Not on file  Occupational History    Comment: [employer]  Social Needs  . Financial resource strain: Not on file  . Food insecurity:    Worry: Not on file    Inability: Not on file  . Transportation  needs:    Medical: Not on file    Non-medical: Not on file  Tobacco Use  . Smoking status: Former Smoker    Years: 8.00    Last attempt to quit: 10/08/1987    Years since quitting: 30.7  . Smokeless tobacco: Never Used  Substance and Sexual Activity  . Alcohol use: Yes    Alcohol/week: 0.0 standard drinks    Comment: 1 glass of wine occasionally  . Drug use: No  . Sexual activity: Not on file  Lifestyle  . Physical activity:    Days per week: Not on file    Minutes per session: Not on file  . Stress: Not on file  Relationships  . Social connections:    Talks on phone: Not on file    Gets together: Not on file    Attends religious service: Not on file    Active member of club or organization: Not on file    Attends meetings of clubs or organizations: Not on file     Relationship status: Not on file  Other Topics Concern  . Not on file  Social History Narrative   Pt lives at home with her husband, daughter, and 2 grandchildren.    She has four children    Husband needs high-level of her care due to h/o brain tumor   Drives a city bus   [place] working outside in her yard and gardening.   drinks one cup of coffee daily.    Depression screen [facility] 2/9 06/01/2018 03/05/2018 01/12/2018 07/10/2017 05/08/2017  Decreased Interest 0 0 0 0 0  Down, Depressed, Hopeless 0 0 0 0 0  PHQ - 2 Score 0 0 0 0 0    Review of Systems See hpi    Objective:   Physical Exam  Constitutional: She is oriented to person, place, and time. She appears well-developed and well-nourished. No distress.  HENT:  Head: Normocephalic and atraumatic.  Right Ear: External ear normal.  Eyes: Conjunctivae are normal. No scleral icterus.  Pulmonary/Chest: Effort normal.  Neurological: She is alert and oriented to person, place, and time.  Skin: Skin is warm and dry. She is not diaphoretic. No erythema.  Psychiatric: She has a normal mood and affect. Her behavior is normal.   BP 132/82   Pulse 81   Temp 98 F (36.7 C) (Oral)   Resp 16   Ht 5' 4.96" (1.65 m)   Wt 169 lb 3.2 oz (76.7 kg)   SpO2 97%   BMI 28.19 kg/m     PROCEDURE NOTE : Left Knee Injection After discussing the risks, benefits, and expected versus possible outcomes of the injection, all additional questions were elicited and answered, and patient elected to undergo the above named procedure. Verbal informed consent obtained. The target structure prepped and injected as detailed below:  Prep: Alcohol x 3 followed by betadine x 3.  Approach: Superior anterolateral approach to left suprapatellar bursa with bilateral knees extended on exam bed and pillow under left knee to allow a minimal ~10 degrees flexion.  Anesthes  Ethyl chloride  Needle:  22g 1.5 inch  Aspirate:  n/a  Meds:  4.5 cc 1% lidocaine plain mixed into 5 cc  syringe with:40mg  Depomedrol  Dressing: Small pressure gauze and Band-Aid  EBL: 0 This procedure was well tolerated and there were no complications.   Post-procedural injection site and joint care reviewed in detail with additional information available in AVS. Blanket activity restrictions unnecessary as long as using  common sense and real-time symptoms to guide progressive return to normal levels of activity. Pt understands to RTC immed or to ER for any development of rash or erythema, drainage from injection site, increasing joint and/or extremity swelling, decreased joint mobility or increasing instability, or any other rapidly progressing adverse changes in health status, symptoms, or function noted. Call the office if experiencing any worsening of joint pain symptoms and any other questions or concerns.  Assessment & Plan:   1. Arthralgia of left knee   2. Long term current use of opiate analgesic   Cortisone injection done today to attempt pain relief and recurrent swelling. Routine UDS done to comply with standards for care of what is unfortunately becoming chronic pian for pt and may require long term chronic opioid therapy. Start gabapentin to see if will help with pain.  Orders Placed This Encounter  Procedures  . ToxASSURE Select 13 (MW), Urine  . Ambulatory referral to Orthopedic Surgery    Referral Priority:   Routine    Referral Type:   Surgical    Referral Reason:   Specialty Services Required    Requested Specialty:   Orthopedic Surgery    Number of Visits Requested:   1    Meds ordered this encounter  Medications  . triamcinolone acetonide (KENALOG-40) injection 40 mg  . gabapentin (NEURONTIN) 300 MG capsule    Sig: 1 tab po qhs x 1 wk, 1 tab po bid x 1 wk, 1 tab po tid.    Dispense:  90 capsule    Refill:  3  . HYDROcodone-acetaminophen (NORCO) 10-325 MG tablet    Sig: Take 1 tablet by mouth every 4 (four) hours as needed (pain).    Dispense:  120 tablet     Refill:  0    For chronic pain    [name], MD, MPH Primary Care at [place], [place]  [phone] Office phone  [phone] Office fax   07/04/18 11:03 PM

## 2018-06-16 ENCOUNTER — Telehealth: Payer: Self-pay | Admitting: Family Medicine

## 2018-06-16 NOTE — Telephone Encounter (Signed)
Called pt. To reschedule visit with dr. Brigitte Pulse on 07/02/18. Left detailed VM to reschedule

## 2018-07-02 ENCOUNTER — Ambulatory Visit: Payer: BLUE CROSS/BLUE SHIELD | Admitting: Family Medicine

## 2018-07-10 ENCOUNTER — Other Ambulatory Visit: Payer: Self-pay | Admitting: Family Medicine

## 2018-07-10 NOTE — Telephone Encounter (Signed)
Requested medication (s) are due for refill today: Yes  Requested medication (s) are on the active medication list: Yes  Last refill:  03/20/18  Future visit scheduled: Yes  Notes to clinic:  Unable to refill per protocol     Requested Prescriptions  Pending Prescriptions Disp Refills   butalbital-acetaminophen-caffeine (FIORICET, ESGIC) 50-325-40 MG tablet [Pharmacy Med Name: BUTALB-ACETAMIN-CAFF 50-325-40] 80 tablet 0    Sig: TAKE 1 TABLET BY MOUTH EVERY 6 HOURS AS NEEDED HEADACHE     Not Delegated - Analgesics:  Non-Opioid Analgesic Combinations Failed - 07/10/2018  4:40 PM      Failed - This refill cannot be delegated      Passed - Valid encounter within last 12 months    Recent Outpatient Visits          1 month ago Arthralgia of left knee   [facility], Laurey Arrow, MD   2 months ago Polymyalgia rheumatica [facility])   [facility], Laurey Arrow, MD   4 months ago Polymyalgia rheumatica [facility])   [facility], Laurey Arrow, MD   5 months ago Pain in joint involving right ankle and foot   [facility], Laurey Arrow, MD   1 year ago Nausea without vomiting   [facility], Laurey Arrow, MD      Future Appointments            In 1 month Brigitte Pulse Laurey Arrow, MD Primary Care at [place], [facility]

## 2018-07-13 NOTE — Telephone Encounter (Signed)
Although this is a controlled substance, it is not listed in the controlled substance registry.  Refill sent in for #80, no refills  Today I have utilized the [place] Controlled Substance Registry's online query to confirm compliance regarding the patient's controlled medications. My review reveals appropriate prescription fills and that I am the sole provider of these medications. Rechecks will occur regularly and the patient is aware of our use of the system.  Last narcotic #120 hydrocodone-APAP 10-325mg  written and filled 06/01/18.

## 2018-07-23 ENCOUNTER — Telehealth: Payer: Self-pay | Admitting: Family Medicine

## 2018-07-23 NOTE — Telephone Encounter (Signed)
Left a VM in regards to her appt on 08/11/2018 at 4:00 pm. The provider is leaving a little bit early on that day and needs to be rescheduled.

## 2018-08-03 DIAGNOSIS — S66212D Strain of extensor muscle, fascia and tendon of left thumb at wrist and hand level, subsequent encounter: Secondary | ICD-10-CM | POA: Diagnosis not present

## 2018-08-04 ENCOUNTER — Other Ambulatory Visit: Payer: Self-pay | Admitting: Family Medicine

## 2018-08-04 NOTE — Telephone Encounter (Signed)
Requested Prescriptions  Pending Prescriptions Disp Refills  . amLODipine (NORVASC) 5 MG tablet [Pharmacy Med Name: AMLODIPINE BESYLATE 5 MG TAB] 90 tablet 0    Sig: TAKE 1 TABLET (5 MG TOTAL) BY MOUTH DAILY.     Cardiovascular:  Calcium Channel Blockers Passed - 08/04/2018  2:03 AM      Passed - Last BP in normal range    BP Readings from Last 1 Encounters:  06/01/18 132/82         Passed - Valid encounter within last 6 months    Recent Outpatient Visits          2 months ago Arthralgia of left knee   [facility], Laurey Arrow, MD   3 months ago Polymyalgia rheumatica [facility])   [facility], Laurey Arrow, MD   5 months ago Polymyalgia rheumatica [facility])   [facility], Laurey Arrow, MD   6 months ago Pain in joint involving right ankle and foot   [facility], Laurey Arrow, MD   1 year ago Nausea without vomiting   [facility], Laurey Arrow, MD

## 2018-08-10 ENCOUNTER — Other Ambulatory Visit: Payer: Self-pay | Admitting: Family Medicine

## 2018-08-10 NOTE — Telephone Encounter (Signed)
Copied from [place] [id number]. Topic: Quick Communication - See Telephone Encounter >> Aug 10, 2018  2:05 PM Vernona Rieger wrote: CRM for notification. See Telephone encounter for: 08/10/18.  HYDROcodone-acetaminophen (NORCO) 10-325 MG tablet gabapentin (NEURONTIN) 300 MG capsule  [id number] - [place], [place] [address] [place] [address]  Patient states she needs these refilled until her appointment for December 5th, patient had an appt for tomorrow and Dr Brigitte Pulse canceled.

## 2018-08-11 ENCOUNTER — Ambulatory Visit: Payer: BLUE CROSS/BLUE SHIELD | Admitting: Family Medicine

## 2018-08-11 MED ORDER — GABAPENTIN 300 MG PO CAPS: ORAL_CAPSULE | ORAL | 3 refills | Status: DC

## 2018-08-11 NOTE — Telephone Encounter (Signed)
Requested Prescriptions  Pending Prescriptions Disp Refills  . gabapentin (NEURONTIN) 300 MG capsule 90 capsule 3    Sig: 1 tab po qhs x 1 wk, 1 tab po bid x 1 wk, 1 tab po tid.     Neurology: Anticonvulsants - gabapentin Passed - 08/10/2018  2:12 PM      Passed - Valid encounter within last 12 months    Recent Outpatient Visits          2 months ago Arthralgia of left knee   [facility], Laurey Arrow, MD   3 months ago Polymyalgia rheumatica [facility])   [facility], Laurey Arrow, MD   5 months ago Polymyalgia rheumatica [facility])   [facility], Laurey Arrow, MD   7 months ago Pain in joint involving right ankle and foot   [facility], Laurey Arrow, MD   1 year ago Nausea without vomiting   [facility], Laurey Arrow, MD      Future Appointments            In 1 month Shawnee Knapp, MD Primary Care at [place], [facility]         . HYDROcodone-acetaminophen (NORCO) 10-325 MG tablet 120 tablet 0    Sig: Take 1 tablet by mouth every 4 (four) hours as needed (pain).     Not Delegated - Analgesics:  Opioid Agonist Combinations Failed - 08/10/2018  2:12 PM      Failed - This refill cannot be delegated      Failed - Urine Drug Screen completed in last 360 days.      Passed - Valid encounter within last 6 months    Recent Outpatient Visits          2 months ago Arthralgia of left knee   [facility], Laurey Arrow, MD   3 months ago Polymyalgia rheumatica [facility])   [facility], Laurey Arrow, MD   5 months ago Polymyalgia rheumatica [facility])   [facility], Laurey Arrow, MD   7 months ago Pain in joint involving right ankle and foot   [facility], Laurey Arrow, MD   1 year ago Nausea without vomiting   [facility], Laurey Arrow, MD      Future Appointments            In 1 month Brigitte Pulse Laurey Arrow, MD Primary Care at [place], [facility]

## 2018-08-11 NOTE — Telephone Encounter (Signed)
Requested medication (s) are due for refill today: yes  Requested medication (s) are on the active medication list: yes  Last refill:  07/10/18  Future visit scheduled: yes  Notes to clinic:  undelegated    Requested Prescriptions  Pending Prescriptions Disp Refills   HYDROcodone-acetaminophen (NORCO) 10-325 MG tablet 120 tablet 0    Sig: Take 1 tablet by mouth every 4 (four) hours as needed (pain).     Not Delegated - Analgesics:  Opioid Agonist Combinations Failed - 08/10/2018  2:12 PM      Failed - This refill cannot be delegated      Failed - Urine Drug Screen completed in last 360 days.      Passed - Valid encounter within last 6 months    Recent Outpatient Visits          2 months ago Arthralgia of left knee   [facility], Laurey Arrow, MD   3 months ago Polymyalgia rheumatica [facility])   [facility], Laurey Arrow, MD   5 months ago Polymyalgia rheumatica [facility])   [facility], Laurey Arrow, MD   7 months ago Pain in joint involving right ankle and foot   [facility], Laurey Arrow, MD   1 year ago Nausea without vomiting   [facility], Laurey Arrow, MD      Future Appointments            In 1 month Shawnee Knapp, MD Primary Care at [place], [facility]         Signed Prescriptions Disp Refills   gabapentin (NEURONTIN) 300 MG capsule 90 capsule 3    Sig: 1 tab po qhs x 1 wk, 1 tab po bid x 1 wk, 1 tab po tid.     Neurology: Anticonvulsants - gabapentin Passed - 08/10/2018  2:12 PM      Passed - Valid encounter within last 12 months    Recent Outpatient Visits          2 months ago Arthralgia of left knee   [facility], Laurey Arrow, MD   3 months ago Polymyalgia rheumatica [facility])   [facility], Laurey Arrow, MD   5 months ago Polymyalgia rheumatica [facility])   [facility], Laurey Arrow, MD   7 months ago Pain in joint involving right ankle and foot   [facility], Laurey Arrow, MD   1 year ago  Nausea without vomiting   [facility], Laurey Arrow, MD      Future Appointments            In 1 month Shawnee Knapp, MD Primary Care at [place], [facility]

## 2018-08-24 ENCOUNTER — Other Ambulatory Visit: Payer: Self-pay | Admitting: Family Medicine

## 2018-08-25 NOTE — Telephone Encounter (Signed)
Requested Prescriptions  Pending Prescriptions Disp Refills  . DULoxetine (CYMBALTA) 60 MG capsule [Pharmacy Med Name: DULOXETINE HCL DR 60 MG CAP] 90 capsule 1    Sig: TAKE 1 CAPSULE BY MOUTH EVERY DAY     Psychiatry: Antidepressants - SNRI Passed - 08/24/2018  1:18 AM      Passed - Completed PHQ-2 or PHQ-9 in the last 360 days.      Passed - Last BP in normal range    BP Readings from Last 1 Encounters:  06/01/18 132/82         Passed - Valid encounter within last 6 months    Recent Outpatient Visits          2 months ago Arthralgia of left knee   [facility], Laurey Arrow, MD   3 months ago Polymyalgia rheumatica [facility])   [facility], Laurey Arrow, MD   5 months ago Polymyalgia rheumatica [facility])   [facility], Laurey Arrow, MD   7 months ago Pain in joint involving right ankle and foot   [facility], Laurey Arrow, MD   1 year ago Nausea without vomiting   [facility], Laurey Arrow, MD      Future Appointments            In 2 weeks Shawnee Knapp, MD Primary Care at [place], [facility]

## 2018-09-10 ENCOUNTER — Encounter: Payer: Self-pay | Admitting: Family Medicine

## 2018-09-10 ENCOUNTER — Ambulatory Visit: Payer: BLUE CROSS/BLUE SHIELD | Admitting: Family Medicine

## 2018-09-10 ENCOUNTER — Other Ambulatory Visit: Payer: Self-pay

## 2018-09-10 VITALS — BP 145/87 | HR 75 | Temp 98.3°F | Resp 12 | Ht 65.0 in | Wt 174.2 lb

## 2018-09-10 DIAGNOSIS — D702 Other drug-induced agranulocytosis: Secondary | ICD-10-CM

## 2018-09-10 DIAGNOSIS — Z79899 Other long term (current) drug therapy: Secondary | ICD-10-CM | POA: Diagnosis not present

## 2018-09-10 DIAGNOSIS — Z5181 Encounter for therapeutic drug level monitoring: Secondary | ICD-10-CM

## 2018-09-10 DIAGNOSIS — M1712 Unilateral primary osteoarthritis, left knee: Secondary | ICD-10-CM | POA: Diagnosis not present

## 2018-09-10 DIAGNOSIS — M797 Fibromyalgia: Secondary | ICD-10-CM

## 2018-09-10 DIAGNOSIS — I10 Essential (primary) hypertension: Secondary | ICD-10-CM

## 2018-09-10 DIAGNOSIS — R519 Headache, unspecified: Secondary | ICD-10-CM

## 2018-09-10 DIAGNOSIS — R51 Headache: Secondary | ICD-10-CM

## 2018-09-10 DIAGNOSIS — G894 Chronic pain syndrome: Secondary | ICD-10-CM

## 2018-09-10 DIAGNOSIS — E559 Vitamin D deficiency, unspecified: Secondary | ICD-10-CM

## 2018-09-10 DIAGNOSIS — G43001 Migraine without aura, not intractable, with status migrainosus: Secondary | ICD-10-CM

## 2018-09-10 MED ORDER — VERAPAMIL HCL ER 180 MG PO TBCR: 180.0000 mg | EXTENDED_RELEASE_TABLET | Freq: Every day | ORAL | 0 refills | Status: DC

## 2018-09-10 MED ORDER — CYCLOBENZAPRINE HCL 10 MG PO TABS: 10.0000 mg | ORAL_TABLET | Freq: Every day | ORAL | 3 refills | Status: DC

## 2018-09-10 MED ORDER — HYDROCODONE-ACETAMINOPHEN 10-325 MG PO TABS: 1.0000 | ORAL_TABLET | ORAL | 0 refills | Status: DC | PRN

## 2018-09-10 MED ORDER — AMITRIPTYLINE HCL 10 MG PO TABS: 10.0000 mg | ORAL_TABLET | Freq: Every day | ORAL | 1 refills | Status: DC

## 2018-09-10 MED ORDER — GABAPENTIN 300 MG PO CAPS: 600.0000 mg | ORAL_CAPSULE | Freq: Three times a day (TID) | ORAL | 3 refills | Status: DC

## 2018-09-10 MED ORDER — AMLODIPINE BESYLATE 5 MG PO TABS: 5.0000 mg | ORAL_TABLET | Freq: Every day | ORAL | 0 refills | Status: DC

## 2018-09-10 NOTE — Patient Instructions (Addendum)
Increase gabapentin to 1 tab in the morning 1 tab in the afternoon and 2 tabs in the evening x1 week Then increase to 2 tabs in the morning, 1 tab in the afternoon, and 2 tabs in the evening x1 week. Then increase to 2 tabs by mouth 3 times a day  You will get a call from [facility] to schedule an evaluation with an orthopedic surgeon about your knee.  You need a repeat x-ray and to consider the gel/hyaluronic acid joint injections.  For your bones and leg cramps: Start a vitamin D supplement of ~4,000iu a day AND a vitamin E 800iu supplement before bed AND a vitamin B complex supplement one to 3 times a day (chose a vitamin B complex which contains at least vitamin B1 (thiamine) 50mg , vitamin B2 (riboflavin) 5mg , vitamin B6 (pyridoxine) 30mg , and vitamin B12 (cyanocobalamin) 233mcg).    If you have lab work done today you will be contacted with your lab results within the next 2 weeks.  If you have not heard from [place] then please contact us. The fastest way to get your results is to register for My Chart.   IF you received an x-ray today, you will receive an invoice from [facility]. Please contact [facility] at [phone] with questions or concerns regarding your invoice.   IF you received labwork today, you will receive an invoice from [facility]. Please contact [facility] at [phone] with questions or concerns regarding your invoice.   Our billing staff will not be able to assist you with questions regarding bills from these companies.  You will be contacted with the lab results as soon as they are available. The fastest way to get your results is to activate your My Chart account. Instructions are located on the last page of this paperwork. If you have not heard from [place] regarding the results in 2 weeks, please contact this office.     Knee Injection, Care After Refer to this sheet in the next few weeks. These instructions provide you with information about  caring for yourself after your procedure. Your health care provider may also give you more specific instructions. Your treatment has been planned according to current medical practices, but problems sometimes occur. Call your health care provider if you have any problems or questions after your procedure. What can I expect after the procedure? After the procedure, it is common to have:  Soreness.  Warmth.  Swelling.  You may have more pain, swelling, and warmth than you did before the injection. This reaction may last for about one day. Follow these instructions at home: Bathing  If you were given a bandage (dressing), keep it dry until your health care provider says it can be removed. Ask your health care provider when you can start showering or taking a bath. Managing pain, stiffness, and swelling  If directed, apply ice to the injection area: ? Put ice in a plastic bag. ? Place a towel between your skin and the bag. ? Leave the ice on for 20 minutes, 2-3 times per day.  Do not apply heat to your knee.  Raise the injection area above the level of your heart while you are sitting or lying down. Activity  Avoid strenuous activities for as long as directed by your health care provider. Ask your health care provider when you can return to your normal activities. General instructions  Take medicines only as directed by your health care provider.  Do not take aspirin or other  over-the-counter medicines unless your health care provider says you can.  Check your injection site every day for signs of infection. Watch for: ? Redness, swelling, or pain. ? Fluid, blood, or pus.  Follow your health care provider's instructions about dressing changes and removal. Contact a health care provider if:  You have symptoms at your injection site that last longer than two days after your procedure.  You have redness, swelling, or pain in your injection area.  You have fluid, blood, or pus  coming from your injection site.  You have warmth in your injection area.  You have a fever.  Your pain is not controlled with medicine. Get help right away if:  Your knee turns very red.  Your knee becomes very swollen.  Your knee pain is severe. This information is not intended to replace advice given to you by your health care provider. Make sure you discuss any questions you have with your health care provider. Document Released: 10/14/2014 Document Revised: 05/29/2016 Document Reviewed: 08/03/2014 Elsevier Interactive Patient Education  2018 [employer].  Knee Injection A knee injection is a procedure to get medicine into your knee joint. Your health care provider puts a needle into the joint and injects medicine with an attached syringe. The injected medicine may relieve the pain, swelling, and stiffness of arthritis. The injected medicine may also help to lubricate and cushion your knee joint. You may need more than one injection. Tell a health care provider about:  Any allergies you have.  All medicines you are taking, including vitamins, herbs, eye drops, creams, and over-the-counter medicines.  Any problems you or family members have had with anesthetic medicines.  Any blood disorders you have.  Any surgeries you have had.  Any medical conditions you have. What are the risks? Generally, this is a safe procedure. However, problems may occur, including:  Infection.  Bleeding.  Worsening symptoms.  Damage to the area around your knee.  Allergic reaction to any of the medicines.  Skin reactions from repeated injections.  What happens before the procedure?  Ask your health care provider about changing or stopping your regular medicines. This is especially important if you are taking diabetes medicines or blood thinners.  Plan to have someone take you home after the procedure. What happens during the procedure?  You will sit or lie down in a position for  your knee to be treated.  The skin over your kneecap will be cleaned with a germ-killing solution (antiseptic).  You will be given a medicine that numbs the area (local anesthetic). You may feel some stinging.  After your knee becomes numb, you will have a second injection. This is the medicine. This needle is carefully placed between your kneecap and your knee. The medicine is injected into the joint space.  At the end of the procedure, the needle will be removed.  A bandage (dressing) may be placed over the injection site. The procedure may vary among health care providers and hospitals. What happens after the procedure?  You may have to move your knee through its full range of motion. This helps to get all of the medicine into your joint space.  Your blood pressure, heart rate, breathing rate, and blood oxygen level will be monitored often until the medicines you were given have worn off.  You will be watched to make sure that you do not have a reaction to the injected medicine. This information is not intended to replace advice given to you by your  health care provider. Make sure you discuss any questions you have with your health care provider. Document Released: 12/15/2006 Document Revised: 02/23/2016 Document Reviewed: 08/03/2014 Elsevier Interactive Patient Education  2018 [employer].  Osteoarthritis Osteoarthritis is a type of arthritis that affects tissue that covers the ends of bones in joints (cartilage). Cartilage acts as a cushion between the bones and helps them move smoothly. Osteoarthritis results when cartilage in the joints gets worn down. Osteoarthritis is sometimes called "wear and tear" arthritis. Osteoarthritis is the most common form of arthritis. It often occurs in older people. It is a condition that gets worse over time (a progressive condition). Joints that are most often affected by this condition are in:  Fingers.  Toes.  Hips.  Knees.  Spine,  including neck and lower back.  What are the causes? This condition is caused by age-related wearing down of cartilage that covers the ends of bones. What increases the risk? The following factors may make you more likely to develop this condition:  Older age.  Being overweight or obese.  Overuse of joints, such as in athletes.  Past injury of a joint.  Past surgery on a joint.  Family history of osteoarthritis.  What are the signs or symptoms? The main symptoms of this condition are pain, swelling, and stiffness in the joint. The joint may lose its shape over time. Small pieces of bone or cartilage may break off and float inside of the joint, which may cause more pain and damage to the joint. Small deposits of bone (osteophytes) may grow on the edges of the joint. Other symptoms may include:  A grating or scraping feeling inside the joint when you move it.  Popping or creaking sounds when you move.  Symptoms may affect one or more joints. Osteoarthritis in a major joint, such as your knee or hip, can make it painful to walk or exercise. If you have osteoarthritis in your hands, you might not be able to grip items, twist your hand, or control small movements of your hands and fingers (fine motor skills). How is this diagnosed? This condition may be diagnosed based on:  Your medical history.  A physical exam.  Your symptoms.  X-rays of the affected joint(s).  Blood tests to rule out other types of arthritis.  How is this treated? There is no cure for this condition, but treatment can help to control pain and improve joint function. Treatment plans may include:  A prescribed exercise program that allows for rest and joint relief. You may work with a physical therapist.  A weight control plan.  Pain relief techniques, such as: ? Applying heat and cold to the joint. ? Electric pulses delivered to nerve endings under the skin (transcutaneous electrical nerve stimulation,  or TENS). ? Massage. ? Certain nutritional supplements.  NSAIDs or prescription medicines to help relieve pain.  Medicine to help relieve pain and inflammation (corticosteroids). This can be given by mouth (orally) or as an injection.  Assistive devices, such as a brace, wrap, splint, specialized glove, or cane.  Surgery, such as: ? An osteotomy. This is done to reposition the bones and relieve pain or to remove loose pieces of bone and cartilage. ? Joint replacement surgery. You may need this surgery if you have very bad (advanced) osteoarthritis.  Follow these instructions at home: Activity  Rest your affected joints as directed by your health care provider.  Do not drive or use heavy machinery while taking prescription pain medicine.  Exercise  as directed. Your health care provider or physical therapist may recommend specific types of exercise, such as: ? Strengthening exercises. These are done to strengthen the muscles that support joints that are affected by arthritis. They can be performed with weights or with exercise bands to add resistance. ? Aerobic activities. These are exercises, such as brisk walking or water aerobics, that get your heart pumping. ? Range-of-motion activities. These keep your joints easy to move. ? Balance and agility exercises. Managing pain, stiffness, and swelling  If directed, apply heat to the affected area as often as told by your health care provider. Use the heat source that your health care provider recommends, such as a moist heat pack or a heating pad. ? If you have a removable assistive device, remove it as told by your health care provider. ? Place a towel between your skin and the heat source. If your health care provider tells you to keep the assistive device on while you apply heat, place a towel between the assistive device and the heat source. ? Leave the heat on for 20-30 minutes. ? Remove the heat if your skin turns bright red. This is  especially important if you are unable to feel pain, heat, or cold. You may have a greater risk of getting burned.  If directed, put ice on the affected joint: ? If you have a removable assistive device, remove it as told by your health care provider. ? Put ice in a plastic bag. ? Place a towel between your skin and the bag. If your health care provider tells you to keep the assistive device on during icing, place a towel between the assistive device and the bag. ? Leave the ice on for 20 minutes, 2-3 times a day. General instructions  Take over-the-counter and prescription medicines only as told by your health care provider.  Maintain a healthy weight. Follow instructions from your health care provider for weight control. These may include dietary restrictions.  Do not use any products that contain nicotine or tobacco, such as cigarettes and e-cigarettes. These can delay bone healing. If you need help quitting, ask your health care provider.  Use assistive devices as directed by your health care provider.  Keep all follow-up visits as told by your health care provider. This is important. Where to find more information:  [employer] of Arthritis and Musculoskeletal and Skin Diseases: www.niams.[employer].es  [employer] on Aging: http://kim-miller.com/  American College of Rheumatology: www.rheumatology.org Contact a health care provider if:  Your skin turns red.  You develop a rash.  You have pain that gets worse.  You have a fever along with joint or muscle aches. Get help right away if:  You lose a lot of weight.  You suddenly lose your appetite.  You have night sweats. Summary  Osteoarthritis is a type of arthritis that affects tissue covering the ends of bones in joints (cartilage).  This condition is caused by age-related wearing down of cartilage that covers the ends of bones.  The main symptom of this condition is pain, swelling, and stiffness in the  joint.  There is no cure for this condition, but treatment can help to control pain and improve joint function. This information is not intended to replace advice given to you by your health care provider. Make sure you discuss any questions you have with your health care provider. Document Released: 09/23/2005 Document Revised: 05/27/2016 Document Reviewed: 05/27/2016 Elsevier Interactive Patient Education  [employer].

## 2018-09-10 NOTE — Progress Notes (Signed)
Subjective:    Patient: Diane [name]  DOB: 04/20/1956; 61 y.o.   MRN: [id number]  Chief Complaint  Patient presents with  . Follow-up    fibromyalgia and left knee pain x 2-3 weeks really hurt. need refill on hydrocodone an possible bp meds, also would like to change headache medication    HPI  Went back to work 2 weeks ago and shot wore off - it is killing her.  The shot was done 8/26 and helped till she went back to work. Never saw orthopedics for her knee that I referred.  Tolerating gabapentin well w/o side effects - seems to help. Wt Readings from Last 3 Encounters:  09/10/18 174 lb 3.2 oz (79 kg)  06/01/18 169 lb 3.2 oz (76.7 kg)  04/30/18 162 lb 9.6 oz (73.8 kg)   Not checking BP outside office at all.   Not taking high dose once wekly vit D  Having night time leg cramps so severe - especially when she is active in the yard during the day. Is at peace when she is out in the yard  Medical History Past Medical History:  Diagnosis Date  . Arthritis   . Complication of anesthesia    "can't stand mask over face." " It makes my head hurt"  . Fibromyalgia   . Headache(784.0)    Migraines  . Hypertension   . Multiple sites of bowel obstruction ([place]) 2005  . Uterine fibroid    Past Surgical History:  Procedure Laterality Date  . CHOLECYSTECTOMY    . COLON RESECTION  1990  . endometrial curretage  2005  . GANGLION CYST EXCISION Right 2001   hand  . HERNIA REPAIR Left    inguinal  . TUBAL LIGATION     Current Outpatient Medications on File Prior to Visit  Medication Sig Dispense Refill  . amLODipine (NORVASC) 5 MG tablet TAKE 1 TABLET (5 MG TOTAL) BY MOUTH DAILY. 90 tablet 0  . butalbital-acetaminophen-caffeine (FIORICET, ESGIC) 50-325-40 MG tablet Take 1-2 tablets by mouth every 6 (six) hours as needed for headache. 80 tablet 0  . cyclobenzaprine (FLEXERIL) 10 MG tablet Take 1 tablet (10 mg total) by mouth at bedtime. 30 tablet 3  . DULoxetine (CYMBALTA) 60  MG capsule TAKE 1 CAPSULE BY MOUTH EVERY DAY 90 capsule 1  . ergocalciferol (VITAMIN D2) 50000 units capsule ergocalciferol (vitamin D2) 50,000 unit capsule  TAKE ONE CAPSULE BY MOUTH ONE TIME PER WEEK    . gabapentin (NEURONTIN) 300 MG capsule 1 tab po qhs x 1 wk, 1 tab po bid x 1 wk, 1 tab po tid. 90 capsule 3  . HYDROcodone-acetaminophen (NORCO) 10-325 MG tablet Take 1 tablet by mouth every 4 (four) hours as needed (pain). 120 tablet 0  . metoCLOPramide (REGLAN) 5 MG tablet Take 1 tablet (5 mg total) by mouth every 6 (six) hours as needed for nausea. Try to take beofre meals and at bedtime if needed 120 tablet 0  . ondansetron (ZOFRAN-ODT) 8 MG disintegrating tablet ondansetron 8 mg disintegrating tablet  TAKE 1 TABLET EVERY 8 HOURS AS NEEDED FOR NAUSEA AND VOMITING    . Vitamin D, Ergocalciferol, (DRISDOL) 50000 units CAPS capsule Take 1 capsule (50,000 Units total) by mouth once a week. 12 capsule 1   Current Facility-Administered Medications on File Prior to Visit  Medication Dose Route Frequency Provider Last Rate Last Dose  . triamcinolone acetonide (KENALOG-40) injection 40 mg  40 mg Intra-articular Once Shawnee Knapp, MD  Allergies  Allergen Reactions  . Topamax [Topiramate] Hives  . Dilaudid [Hydromorphone Hcl] Hives   Family History  Problem Relation Age of Onset  . Healthy Mother    Social History   Socioeconomic History  . Marital status: Married    Spouse name: Diane [name]  . Number of children: 4  . Years of education: 62  . Highest education level: Not on file  Occupational History    Comment: [employer]  Social Needs  . Financial resource strain: Not on file  . Food insecurity:    Worry: Not on file    Inability: Not on file  . Transportation needs:    Medical: Not on file    Non-medical: Not on file  Tobacco Use  . Smoking status: Former Smoker    Years: 8.00    Last attempt to quit: 10/08/1987    Years since quitting: 30.9  . Smokeless tobacco: Never Used    Substance and Sexual Activity  . Alcohol use: Yes    Alcohol/week: 0.0 standard drinks    Comment: 1 glass of wine occasionally  . Drug use: No  . Sexual activity: Not on file  Lifestyle  . Physical activity:    Days per week: Not on file    Minutes per session: Not on file  . Stress: Not on file  Relationships  . Social connections:    Talks on phone: Not on file    Gets together: Not on file    Attends religious service: Not on file    Active member of club or organization: Not on file    Attends meetings of clubs or organizations: Not on file    Relationship status: Not on file  Other Topics Concern  . Not on file  Social History Narrative   Pt lives at home with her husband, daughter, and 2 grandchildren.    She has four children    Husband needs high-level of her care due to h/o brain tumor   Drives a city bus   [place] working outside in her yard and gardening.   drinks one cup of coffee daily.    Depression screen [facility] 2/9 09/10/2018 06/01/2018 03/05/2018 01/12/2018 07/10/2017  Decreased Interest 0 0 0 0 0  Down, Depressed, Hopeless 0 0 0 0 0  PHQ - 2 Score 0 0 0 0 0    ROS As noted in HPI  Objective:  BP (!) 145/87 (BP Location: Right Arm, Patient Position: Sitting, Cuff Size: Large)   Pulse 75   Temp 98.3 F (36.8 C) (Oral)   Resp 12   Ht 5\' 5"  (1.651 m)   Wt 174 lb 3.2 oz (79 kg)   SpO2 96%   BMI 28.99 kg/m  Physical Exam  POC TESTING Office Visit on 09/10/2018  Component Date Value Ref Range Status  . Summary 09/10/2018 FINAL   Final   Comment: ==================================================================== TOXASSURE SELECT 13 (MW) ==================================================================== Test                             Result       Flag       Units Drug Present   Hydrocodone                    114                     ng/mg creat   Hydromorphone  59                      ng/mg creat   Dihydrocodeine                 55                       ng/mg creat   Norhydrocodone                 812                     ng/mg creat    Sources of hydrocodone include scheduled prescription    medications. Hydromorphone, dihydrocodeine and norhydrocodone are    expected metabolites of hydrocodone. Hydromorphone and    dihydrocodeine are also available as scheduled prescription    medications.   Butalbital                     PRESENT ==================================================================== Test                      Result    Flag   Units      Ref Range   Creatinine              210                                        mg/dL      >=20 ==================================================================== Declared Medications:  Medication list was not provided. ==================================================================== For clinical consultation, please call [phone]. ====================================================================   . WBC 09/10/2018 3.5  3.4 - 10.8 x10E3/uL Final  . RBC 09/10/2018 4.82  3.77 - 5.28 x10E6/uL Final  . Hemoglobin 09/10/2018 13.5  11.1 - 15.9 g/dL Final  . Hematocrit 09/10/2018 40.5  34.0 - 46.6 % Final  . MCV 09/10/2018 84  79 - 97 fL Final  . MCH 09/10/2018 28.0  26.6 - 33.0 pg Final  . MCHC 09/10/2018 33.3  31.5 - 35.7 g/dL Final  . RDW 09/10/2018 13.1  12.3 - 15.4 % Final  . Platelets 09/10/2018 328  150 - 450 x10E3/uL Final  . Neutrophils 09/10/2018 40  Not Estab. % Final  . Lymphs 09/10/2018 47  Not Estab. % Final  . Monocytes 09/10/2018 9  Not Estab. % Final  . Eos 09/10/2018 3  Not Estab. % Final  . Basos 09/10/2018 1  Not Estab. % Final  . Neutrophils Absolute 09/10/2018 1.4  1.4 - 7.0 x10E3/uL Final  . Lymphocytes Absolute 09/10/2018 1.7  0.7 - 3.1 x10E3/uL Final  . Monocytes Absolute 09/10/2018 0.3  0.1 - 0.9 x10E3/uL Final  . EOS (ABSOLUTE) 09/10/2018 0.1  0.0 - 0.4 x10E3/uL Final  . Basophils Absolute 09/10/2018 0.0  0.0 - 0.2 x10E3/uL Final   . Immature Granulocytes 09/10/2018 0  Not Estab. % Final  . Immature Grans (Abs) 09/10/2018 0.0  0.0 - 0.1 x10E3/uL Final  . Total Iron Binding Capacity 09/10/2018 332  250 - 450 ug/dL Final  . UIBC 09/10/2018 258  118 - 369 ug/dL Final  . Iron 09/10/2018 74  27 - 139 ug/dL Final  . Iron Saturation 09/10/2018 22  15 - 55 % Final  . Ferritin 09/10/2018 83  15 - 150 ng/mL Final  . Vit D, 25-Hydroxy 09/10/2018 12.8* 30.0 - 100.0 ng/mL Final  Comment: Vitamin D deficiency has been defined by the [place] practice guideline as a level of serum 25-OH vitamin D less than 20 ng/mL (1,2). The Endocrine Society went on to further define vitamin D insufficiency as a level between 21 and 29 ng/mL (2). 1. IOM (Institute of Medicine). 2010. Dietary reference    intakes for calcium and D. [place]: The    [employer]. 2. Holick MF, Binkley Crawford, Bischoff-Ferrari HA, et al.    Evaluation, treatment, and prevention of vitamin D    deficiency: an Endocrine Society clinical practice    guideline. JCEM. 2011 Jul; 96(7):1911-30.     Risks/benefits of corticosteroid injection reviewed and informed consent obtained.  Knee positioned at 10 deg flexion, cleaned with EtOH x 4.  Anesthesia w/ ethyl chloride cold spray. Superior lateral approach used to inject suprapatellar bursa with 40mg  of DepoMedrol and 5cc of 1% lidocaine using 21g 1 1/2in needle without complications. Pt tolerated procedure well. No EBL.  Assessment & Plan:   1. Chronic daily headache   2. Other drug-induced neutropenia ([place])   3. Fibromyalgia   4. Primary osteoarthritis of left knee   5. High risk medication use   6. Medication monitoring encounter   7. Vitamin D deficiency   8. Migraine without aura and with status migrainosus, not intractable   9. Essential hypertension   10. Chronic pain syndrome    Patient will continue on current chronic medications other than changes  noted above, so ok to refill when needed.   See after visit summary for patient specific instructions.  Orders Placed This Encounter  Procedures  . ToxASSURE Select 13 (MW), Urine  . CBC with Differential/Platelet  . Iron, TIBC and Ferritin Panel  . VITAMIN D 25 Hydroxy (Vit-D Deficiency, Fractures)  . Ambulatory referral to Orthopedic Surgery    Referral Priority:   Routine    Referral Type:   Surgical    Referral Reason:   Specialty Services Required    Requested Specialty:   Orthopedic Surgery    Number of Visits Requested:   1    Meds ordered this encounter  Medications  . gabapentin (NEURONTIN) 300 MG capsule    Sig: Take 2 capsules (600 mg total) by mouth 3 (three) times daily.    Dispense:  90 capsule    Refill:  3  . cyclobenzaprine (FLEXERIL) 10 MG tablet    Sig: Take 1 tablet (10 mg total) by mouth at bedtime.    Dispense:  30 tablet    Refill:  3  . DISCONTD: amLODipine (NORVASC) 5 MG tablet    Sig: Take 1 tablet (5 mg total) by mouth daily.    Dispense:  90 tablet    Refill:  0  . amitriptyline (ELAVIL) 10 MG tablet    Sig: Take 1 tablet (10 mg total) by mouth at bedtime.    Dispense:  90 tablet    Refill:  1  . HYDROcodone-acetaminophen (NORCO) 10-325 MG tablet    Sig: Take 1 tablet by mouth every 4 (four) hours as needed (pain).    Dispense:  120 tablet    Refill:  0    For chronic pain  . verapamil (CALAN-SR) 180 MG CR tablet    Sig: Take 1 tablet (180 mg total) by mouth at bedtime.    Dispense:  90 tablet    Refill:  0    D/C AMLODIPINE - CHANGE IN MED TO HELP HEADACHE  Patient verbalized to me that they understand the following: diagnosis, what is being done for them, what to expect and what should be done at home.  Their questions have been answered. They understand that I am unable to predict every possible medication interaction or adverse outcome and that if any unexpected symptoms arise, they should contact us and their pharmacist, as well as  never hesitate to seek urgent/emergent care at [facility] or ER if they think it might be warranted.    Over 40 min spent in face-to-face evaluation of and consultation with patient and coordination of care.  Over 50% of this time was spent counseling this patient regarding above.  [name], MD, MPH Primary Care at [facility] [address] [place], [place]  [phone] Office phone  [phone] Office fax  09/10/18 5:18 PM

## 2018-09-12 LAB — CBC WITH DIFFERENTIAL/PLATELET
Basophils Absolute: 0 10*3/uL (ref 0.0–0.2)
Basos: 1 %
EOS (ABSOLUTE): 0.1 10*3/uL (ref 0.0–0.4)
EOS: 3 %
HEMATOCRIT: 40.5 % (ref 34.0–46.6)
Hemoglobin: 13.5 g/dL (ref 11.1–15.9)
IMMATURE GRANULOCYTES: 0 %
Immature Grans (Abs): 0 10*3/uL (ref 0.0–0.1)
LYMPHS ABS: 1.7 10*3/uL (ref 0.7–3.1)
Lymphs: 47 %
MCH: 28 pg (ref 26.6–33.0)
MCHC: 33.3 g/dL (ref 31.5–35.7)
MCV: 84 fL (ref 79–97)
MONOS ABS: 0.3 10*3/uL (ref 0.1–0.9)
Monocytes: 9 %
NEUTROS ABS: 1.4 10*3/uL (ref 1.4–7.0)
NEUTROS PCT: 40 %
PLATELETS: 328 10*3/uL (ref 150–450)
RBC: 4.82 x10E6/uL (ref 3.77–5.28)
RDW: 13.1 % (ref 12.3–15.4)
WBC: 3.5 10*3/uL (ref 3.4–10.8)

## 2018-09-12 LAB — IRON,TIBC AND FERRITIN PANEL
Ferritin: 83 ng/mL (ref 15–150)
IRON: 74 ug/dL (ref 27–139)
Iron Saturation: 22 % (ref 15–55)
Total Iron Binding Capacity: 332 ug/dL (ref 250–450)
UIBC: 258 ug/dL (ref 118–369)

## 2018-09-12 LAB — VITAMIN D 25 HYDROXY (VIT D DEFICIENCY, FRACTURES): VIT D 25 HYDROXY: 12.8 ng/mL — AB (ref 30.0–100.0)

## 2018-09-14 LAB — TOXASSURE SELECT 13 (MW), URINE

## 2018-09-17 DIAGNOSIS — S52532D Colles' fracture of left radius, subsequent encounter for closed fracture with routine healing: Secondary | ICD-10-CM | POA: Diagnosis not present

## 2018-10-14 ENCOUNTER — Telehealth: Payer: Self-pay | Admitting: Family Medicine

## 2018-10-14 NOTE — Telephone Encounter (Signed)
Attempted to call patient to see if she is still actively taking her BP medication and if she has been monitoring her BP- no answer on cell #. Patient does have appointment tomorrow with Dr Carlota Raspberry.

## 2018-10-14 NOTE — Telephone Encounter (Signed)
Copied from [place] [id number]. Topic: Quick Communication - Rx Refill/Question >> Oct 14, 2018  8:18 AM Scherrie Gerlach wrote: Medication: amLODipine (NORVASC) 5 MG tablet  Pt states Dr Brigitte Pulse changed her bp med on 12/05, but the verapamil (CALAN-SR) 180 MG CR tablet is giving her a headache.  Pt states she cannot take this. Wants to go back to amlodipine.  Pt has appt tomorrow with Dr Carlota Raspberry, 10/15/2018 because she does not feel well.  Pt states her body aches all over. [id number] [place], [place]. [phone] (Phone) [phone] (Fax)

## 2018-10-15 ENCOUNTER — Other Ambulatory Visit: Payer: Self-pay

## 2018-10-15 ENCOUNTER — Encounter: Payer: Self-pay | Admitting: Family Medicine

## 2018-10-15 ENCOUNTER — Ambulatory Visit: Payer: BLUE CROSS/BLUE SHIELD | Admitting: Family Medicine

## 2018-10-15 VITALS — BP 127/81 | HR 85 | Temp 98.6°F | Resp 16 | Ht 65.0 in | Wt 171.5 lb

## 2018-10-15 DIAGNOSIS — A084 Viral intestinal infection, unspecified: Secondary | ICD-10-CM | POA: Diagnosis not present

## 2018-10-15 DIAGNOSIS — R6889 Other general symptoms and signs: Secondary | ICD-10-CM

## 2018-10-15 LAB — POCT INFLUENZA A/B
Influenza A, POC: NEGATIVE
Influenza B, POC: NEGATIVE

## 2018-10-15 MED ORDER — OMEPRAZOLE 20 MG PO CPDR: 20.0000 mg | DELAYED_RELEASE_CAPSULE | Freq: Every day | ORAL | 0 refills | Status: DC

## 2018-10-15 MED ORDER — PROMETHAZINE HCL 25 MG PO TABS: 25.0000 mg | ORAL_TABLET | Freq: Three times a day (TID) | ORAL | 0 refills | Status: DC | PRN

## 2018-10-15 NOTE — Patient Instructions (Addendum)
If you have lab work done today you will be contacted with your lab results within the next 2 weeks.  If you have not heard from [place] then please contact us. The fastest way to get your results is to register for My Chart.   IF you received an x-ray today, you will receive an invoice from [facility]. Please contact [facility] at [phone] with questions or concerns regarding your invoice.   IF you received labwork today, you will receive an invoice from [facility]. Please contact [facility] at [phone] with questions or concerns regarding your invoice.   Our billing staff will not be able to assist you with questions regarding bills from these companies.  You will be contacted with the lab results as soon as they are available. The fastest way to get your results is to activate your My Chart account. Instructions are located on the last page of this paperwork. If you have not heard from [place] regarding the results in 2 weeks, please contact this office.      Viral Gastroenteritis, Adult  Viral gastroenteritis is also known as the stomach flu. This condition is caused by certain germs (viruses). These germs can be passed from person to person very easily (are very contagious). This condition can cause sudden watery poop (diarrhea), fever, and throwing up (vomiting). Having watery poop and throwing up can make you feel weak and cause you to get dehydrated. Dehydration can make you tired and thirsty, make you have a dry mouth, and make it so you pee (urinate) less often. Older adults and people with other diseases or a weak defense system (immune system) are at higher risk for dehydration. It is important to replace the fluids that you lose from having watery poop and throwing up. Follow these instructions at home: Follow instructions from your doctor about how to care for yourself at home. Eating and drinking Follow these instructions as told by your doctor:  Take an  oral rehydration solution (ORS). This is a drink that is sold at pharmacies and stores.  Drink clear fluids in small amounts as you are able, such as: ? Water. ? Ice chips. ? Diluted fruit juice. ? Low-calorie sports drinks.  Eat bland, easy-to-digest foods in small amounts as you are able, such as: ? Bananas. ? Applesauce. ? Rice. ? Low-fat (lean) meats. ? Toast. ? Crackers.  Avoid fluids that have a lot of sugar or caffeine in them.  Avoid alcohol.  Avoid spicy or fatty foods. General instructions   Drink enough fluid to keep your pee (urine) clear or pale yellow.  Wash your hands often. If you cannot use soap and water, use hand sanitizer.  Make sure that all people in your home wash their hands well and often.  Rest at home while you get better.  Take over-the-counter and prescription medicines only as told by your doctor.  Watch your condition for any changes.  Take a warm bath to help with any burning or pain from having watery poop.  Keep all follow-up visits as told by your doctor. This is important. Contact a doctor if:  You cannot keep fluids down.  Your symptoms get worse.  You have new symptoms.  You feel light-headed or dizzy.  You have muscle cramps. Get help right away if:  You have chest pain.  You feel very weak or you pass out (faint).  You see blood in your throw-up.  Your throw-up looks like coffee grounds.  You  have bloody or black poop (stools) or poop that look like tar.  You have a very bad headache, a stiff neck, or both.  You have a rash.  You have very bad pain, cramping, or bloating in your belly (abdomen).  You have trouble breathing.  You are breathing very quickly.  Your heart is beating very quickly.  Your skin feels cold and clammy.  You feel confused.  You have pain when you pee.  You have signs of dehydration, such as: ? Dark pee, hardly any pee, or no pee. ? Cracked lips. ? Dry mouth. ? Sunken  eyes. ? Sleepiness. ? Weakness. This information is not intended to replace advice given to you by your health care provider. Make sure you discuss any questions you have with your health care provider. Document Released: 03/11/2008 Document Revised: 06/17/2018 Document Reviewed: 05/30/2015 Elsevier Interactive Patient Education  2019 [employer].

## 2018-10-15 NOTE — Progress Notes (Signed)
1/9/20204:44 PM  Diane [name] 01-02-1956, 63 y.o. female [id number]  Chief Complaint  Patient presents with  . URI    bodyaches, mucus, not feeling well had a sorethroat 1 wk ago but throat is better, loss of  appetite    HPI:   Patient is a 63 y.o. female with past medical history significant for depression, fibromylagia, migraines who presents today for sore throat, body aches and mucus for past week  PCP Dr Brigitte Pulse  2 weeks of body aches, ear pain, sore throat Coughing, dry, mostly at night, no SOB, coughs vigourous that she has vomited Felt was getting better and then yesterday started with diarrhea Denies any black tarry stool or red blood Denies any urinary sx No appetite, nauseaous Yesterday very diaphoretic but no fever Mild nasal congestion  Co-worker had the flu Has not had flu vaccine this season Has not had much to drink today, "taste like poison"   Fall Risk  10/15/2018 09/10/2018 06/01/2018 03/05/2018 01/12/2018  Falls in the past year? 0 0 Yes No No  Number falls in past yr: - - 1 - -  Injury with Fall? - - Yes - -     Depression screen [facility] 2/9 10/15/2018 09/10/2018 06/01/2018  Decreased Interest 0 0 0  Down, Depressed, Hopeless 0 0 0  PHQ - 2 Score 0 0 0    Allergies  Allergen Reactions  . Topamax [Topiramate] Hives  . Dilaudid [Hydromorphone Hcl] Hives    Prior to Admission medications   Medication Sig Start Date End Date Taking? Authorizing Provider  amitriptyline (ELAVIL) 10 MG tablet Take 1 tablet (10 mg total) by mouth at bedtime. 09/10/18  Yes Shawnee Knapp, MD  cyclobenzaprine (FLEXERIL) 10 MG tablet Take 1 tablet (10 mg total) by mouth at bedtime. 09/10/18  Yes Shawnee Knapp, MD  DULoxetine (CYMBALTA) 60 MG capsule TAKE 1 CAPSULE BY MOUTH EVERY DAY 08/25/18  Yes Shawnee Knapp, MD  gabapentin (NEURONTIN) 300 MG capsule Take 2 capsules (600 mg total) by mouth 3 (three) times daily. 09/10/18  Yes Shawnee Knapp, MD  HYDROcodone-acetaminophen [facility]) 10-325  MG tablet Take 1 tablet by mouth every 4 (four) hours as needed (pain). 09/10/18  Yes Shawnee Knapp, MD  metoCLOPramide (REGLAN) 5 MG tablet Take 1 tablet (5 mg total) by mouth every 6 (six) hours as needed for nausea. Try to take beofre meals and at bedtime if needed 03/05/18  Yes Shawnee Knapp, MD  ondansetron (ZOFRAN-ODT) 8 MG disintegrating tablet ondansetron 8 mg disintegrating tablet  TAKE 1 TABLET EVERY 8 HOURS AS NEEDED FOR NAUSEA AND VOMITING   Yes [provider]    Past Medical History:  Diagnosis Date  . Arthritis   . Complication of anesthesia    "can't stand mask over face." " It makes my head hurt"  . Fibromyalgia   . Headache(784.0)    Migraines  . Hypertension   . Multiple sites of bowel obstruction ([place]) 2005  . Uterine fibroid     Past Surgical History:  Procedure Laterality Date  . CHOLECYSTECTOMY    . COLON RESECTION  1990  . endometrial curretage  2005  . GANGLION CYST EXCISION Right 2001   hand  . HERNIA REPAIR Left    inguinal  . TUBAL LIGATION      Social History   Tobacco Use  . Smoking status: Former Smoker    Years: 8.00    Last attempt to quit: 10/08/1987  Years since quitting: 31.0  . Smokeless tobacco: Never Used  Substance Use Topics  . Alcohol use: Yes    Alcohol/week: 0.0 standard drinks    Comment: 1 glass of wine occasionally    Family History  Problem Relation Age of Onset  . Healthy Mother     ROS Per hpi  OBJECTIVE:  Blood pressure 127/81, pulse 85, temperature 98.6 F (37 C), temperature source Oral, resp. rate 16, height 5\' 5"  (1.651 m), weight 171 lb 8 oz (77.8 kg), SpO2 96 %. Body mass index is 28.54 kg/m.   Wt Readings from Last 3 Encounters:  10/15/18 171 lb 8 oz (77.8 kg)  09/10/18 174 lb 3.2 oz (79 kg)  06/01/18 169 lb 3.2 oz (76.7 kg)    Physical Exam Vitals signs and nursing note reviewed.  Constitutional:      Appearance: She is well-developed.  HENT:     Head: Normocephalic and atraumatic.      Right Ear: Hearing, tympanic membrane, ear canal and external ear normal.     Left Ear: Hearing, tympanic membrane, ear canal and external ear normal.     Nose: Congestion present.     Right Sinus: Maxillary sinus tenderness present. No frontal sinus tenderness.     Left Sinus: Maxillary sinus tenderness present. No frontal sinus tenderness.     Mouth/Throat:     Pharynx: Posterior oropharyngeal erythema present. No pharyngeal swelling or oropharyngeal exudate.  Eyes:     Conjunctiva/sclera: Conjunctivae normal.     Pupils: Pupils are equal, round, and reactive to light.  Neck:     Musculoskeletal: Neck supple.  Cardiovascular:     Rate and Rhythm: Normal rate and regular rhythm.     Heart sounds: Normal heart sounds. No murmur. No friction rub. No gallop.   Pulmonary:     Effort: Pulmonary effort is normal.     Breath sounds: Normal breath sounds. No wheezing or rales.  Abdominal:     General: Bowel sounds are normal.     Palpations: Abdomen is soft.     Tenderness: There is abdominal tenderness in the epigastric area. There is no guarding or rebound.  Lymphadenopathy:     Cervical: Cervical adenopathy present.  Skin:    General: Skin is warm and dry.  Neurological:     Mental Status: She is alert and oriented to person, place, and time.     Results for orders placed or performed in visit on 10/15/18 (from the past 24 hour(s))  POCT Influenza A/B     Status: None   Collection Time: 10/15/18  4:57 PM  Result Value Ref Range   Influenza A, POC Negative Negative   Influenza B, POC Negative Negative    ASSESSMENT and PLAN  1. Viral gastroenteritis Discussed supportive measures, new meds r/se/b and RTC precautions. Patient educational handout given.  2. Flu-like symptoms - POCT Influenza A/B  Other orders - promethazine (PHENERGAN) 25 MG tablet; Take 1 tablet (25 mg total) by mouth every 8 (eight) hours as needed for nausea or vomiting. - omeprazole (PRILOSEC) 20 MG  capsule; Take 1 capsule (20 mg total) by mouth daily.    Return if symptoms worsen or fail to improve.    Rutherford Guys, MD Primary Care at [facility] [place], [place] [address] Ph.  [phone] Fax [phone]

## 2018-11-07 DIAGNOSIS — M25562 Pain in left knee: Secondary | ICD-10-CM | POA: Diagnosis not present

## 2018-11-09 ENCOUNTER — Other Ambulatory Visit: Payer: Self-pay | Admitting: Family Medicine

## 2018-11-09 ENCOUNTER — Other Ambulatory Visit: Payer: Self-pay | Admitting: *Deleted

## 2018-11-09 MED ORDER — OMEPRAZOLE 20 MG PO CPDR: 20.0000 mg | DELAYED_RELEASE_CAPSULE | Freq: Every day | ORAL | 0 refills | Status: DC

## 2018-11-09 NOTE — Telephone Encounter (Signed)
Requested medication (s) are due for refill today: yes  Requested medication (s) are on the active medication list: yes  Last refill:  10/15/18  Future visit scheduled: no  Notes to clinic:  Medication was ordered with no refills for gastroenteritis. Called pt and unable to leave message to assess symptoms.    Requested Prescriptions  Pending Prescriptions Disp Refills   omeprazole (PRILOSEC) 20 MG capsule [Pharmacy Med Name: OMEPRAZOLE DR 20 MG CAPSULE] 30 capsule 0    Sig: TAKE 1 CAPSULE BY MOUTH EVERY DAY     Gastroenterology: Proton Pump Inhibitors Passed - 11/09/2018 12:35 PM      Passed - Valid encounter within last 12 months    Recent Outpatient Visits          3 weeks ago Viral gastroenteritis   [facility], Lilia Argue, MD   2 months ago Chronic daily headache   [facility], Laurey Arrow, MD   5 months ago Arthralgia of left knee   [facility], Laurey Arrow, MD   6 months ago Polymyalgia rheumatica [facility])   [facility], Laurey Arrow, MD   8 months ago Polymyalgia rheumatica [facility])   [facility], Laurey Arrow, MD

## 2018-11-14 DIAGNOSIS — M25562 Pain in left knee: Secondary | ICD-10-CM | POA: Diagnosis not present

## 2018-11-25 ENCOUNTER — Other Ambulatory Visit: Payer: Self-pay | Admitting: Family Medicine

## 2018-11-28 DIAGNOSIS — M13862 Other specified arthritis, left knee: Secondary | ICD-10-CM | POA: Diagnosis not present

## 2018-12-03 ENCOUNTER — Other Ambulatory Visit: Payer: Self-pay | Admitting: Family Medicine

## 2018-12-03 NOTE — Telephone Encounter (Signed)
Requested medication (s) are due for refill today: n/a  Requested medication (s) are on the active medication list: No  Last refill:  n/a  Future visit scheduled: No  Notes to clinic:  Medication is not on med list.    Requested Prescriptions  Pending Prescriptions Disp Refills   verapamil (CALAN-SR) 180 MG CR tablet [Pharmacy Med Name: VERAPAMIL ER 180 MG TABLET] 90 tablet 0    Sig: Take 1 tablet (180 mg total) by mouth at bedtime.     Cardiovascular:  Calcium Channel Blockers Passed - 12/03/2018  1:03 AM      Passed - Last BP in normal range    BP Readings from Last 1 Encounters:  10/15/18 127/81         Passed - Valid encounter within last 6 months    Recent Outpatient Visits          1 month ago Viral gastroenteritis   [facility], Lilia Argue, MD   2 months ago Chronic daily headache   [facility], Laurey Arrow, MD   6 months ago Arthralgia of left knee   [facility], Laurey Arrow, MD   7 months ago Polymyalgia rheumatica [facility])   [facility], Laurey Arrow, MD   9 months ago Polymyalgia rheumatica [facility])   [facility], Laurey Arrow, MD      Future Appointments            In 1 month Brigitte Pulse Laurey Arrow, MD Primary [facility] at [facility]

## 2018-12-11 ENCOUNTER — Telehealth: Payer: Self-pay | Admitting: Family Medicine

## 2018-12-11 NOTE — Telephone Encounter (Signed)
Called pt. And informed via voice mail them of Dr. Raul Del sudden absence and the necessity to cancel their appt scheduled with her. Pt. Was given her new practices information and was offered the opportunity to reschedule with [place]. [employer] Address and phone number  [address] [place] [address] Phone: [phone]

## 2018-12-13 ENCOUNTER — Other Ambulatory Visit: Payer: Self-pay | Admitting: Family Medicine

## 2018-12-13 MED ORDER — DULOXETINE HCL 60 MG PO CPEP: 60.0000 mg | ORAL_CAPSULE | Freq: Every day | ORAL | 1 refills | Status: DC

## 2018-12-13 MED ORDER — GABAPENTIN 300 MG PO CAPS: 600.0000 mg | ORAL_CAPSULE | Freq: Three times a day (TID) | ORAL | 3 refills | Status: DC

## 2018-12-13 MED ORDER — VITAMIN D (ERGOCALCIFEROL) 1.25 MG (50000 UNIT) PO CAPS: 50000.0000 [IU] | ORAL_CAPSULE | ORAL | 1 refills | Status: DC

## 2018-12-13 MED ORDER — HYDROCODONE-ACETAMINOPHEN 10-325 MG PO TABS: 1.0000 | ORAL_TABLET | ORAL | 0 refills | Status: DC | PRN

## 2018-12-14 ENCOUNTER — Other Ambulatory Visit: Payer: Self-pay | Admitting: Family Medicine

## 2018-12-15 NOTE — Telephone Encounter (Signed)
Requested medication (s) are due for refill today: yes  Requested medication (s) are on the active medication list: no  Last refill:  07/13/18 RX ended 09/10/18 "ineffective"  Future visit scheduled: no  Notes to clinic:  Medication not delegated to NT to refill   Requested Prescriptions  Pending Prescriptions Disp Refills   butalbital-acetaminophen-caffeine (FIORICET, ESGIC) 50-325-40 MG tablet [Pharmacy Med Name: BUTALB-ACETAMIN-CAFF 50-325-40] 80 tablet 0    Sig: Take 1-2 tablets by mouth every 6 (six) hours as needed for headache.     Not Delegated - Analgesics:  Non-Opioid Analgesic Combinations Failed - 12/14/2018  4:27 PM      Failed - This refill cannot be delegated      Passed - Valid encounter within last 12 months    Recent Outpatient Visits          2 months ago Viral gastroenteritis   [facility], Lilia Argue, MD   3 months ago Chronic daily headache   [facility], Laurey Arrow, MD   6 months ago Arthralgia of left knee   [facility], Laurey Arrow, MD   7 months ago Polymyalgia rheumatica [facility])   [facility], Laurey Arrow, MD   9 months ago Polymyalgia rheumatica [facility])   [facility], Laurey Arrow, MD

## 2018-12-21 ENCOUNTER — Other Ambulatory Visit: Payer: Self-pay | Admitting: Family Medicine

## 2018-12-21 ENCOUNTER — Encounter: Payer: Self-pay | Admitting: Family Medicine

## 2018-12-21 ENCOUNTER — Other Ambulatory Visit: Payer: Self-pay

## 2018-12-21 ENCOUNTER — Ambulatory Visit: Payer: BLUE CROSS/BLUE SHIELD | Admitting: Family Medicine

## 2018-12-21 VITALS — BP 120/74 | HR 82 | Temp 98.2°F | Resp 18 | Ht 65.0 in | Wt 171.2 lb

## 2018-12-21 DIAGNOSIS — B349 Viral infection, unspecified: Secondary | ICD-10-CM

## 2018-12-21 DIAGNOSIS — Z1231 Encounter for screening mammogram for malignant neoplasm of breast: Secondary | ICD-10-CM | POA: Diagnosis not present

## 2018-12-21 DIAGNOSIS — R5383 Other fatigue: Secondary | ICD-10-CM

## 2018-12-21 LAB — POC INFLUENZA A&B (BINAX/QUICKVUE)
Influenza A, POC: NEGATIVE
Influenza B, POC: NEGATIVE

## 2018-12-21 NOTE — Patient Instructions (Signed)
° ° ° °  If you have lab work done today you will be contacted with your lab results within the next 2 weeks.  If you have not heard from us then please contact us. The fastest way to get your results is to register for My Chart. ° ° °IF you received an x-ray today, you will receive an invoice from [facility]. Please contact [facility] at [phone] with questions or concerns regarding your invoice.  ° °IF you received labwork today, you will receive an invoice from [facility]. Please contact [facility] at [phone] with questions or concerns regarding your invoice.  ° °Our billing staff will not be able to assist you with questions regarding bills from these companies. ° °You will be contacted with the lab results as soon as they are available. The fastest way to get your results is to activate your My Chart account. Instructions are located on the last page of this paperwork. If you have not heard from us regarding the results in 2 weeks, please contact this office. °  ° ° ° °

## 2018-12-21 NOTE — Progress Notes (Signed)
3/16/202012:20 PM  HARLENE PETRALIA [birth year], 63 y.o. female [id number]  Chief Complaint  Patient presents with  . Fatigue    since friday   . Nausea    HPI:   Patient is a 63 y.o. female with past medical history significant for migraines, HTN, depression, insomnia, fibromyalgia who presents today for fatigue and nausea for past 4 days  Feeling very tired, rundown Nauseous, has not been vomiting, some abd pain, no diarrhea Having intermittent headaches and body aches Left ear pain, no nasal congestion, no sore throat, no cough No fever or chills No dysuria or hematuria Daughter sick with similar Has not had flu vaccine this season  Fall Risk  12/21/2018 10/15/2018 09/10/2018 06/01/2018 03/05/2018  Falls in the past year? 0 0 0 Yes No  Number falls in past yr: - - - 1 -  Injury with Fall? - - - Yes -  Follow up Falls evaluation completed - - - -     Depression screen [facility] 2/9 12/21/2018 10/15/2018 09/10/2018  Decreased Interest 0 0 0  Down, Depressed, Hopeless 0 0 0  PHQ - 2 Score 0 0 0    Allergies  Allergen Reactions  . Topamax [Topiramate] Hives  . Dilaudid [Hydromorphone Hcl] Hives    Prior to Admission medications   Medication Sig Start Date End Date Taking? Authorizing Provider  amitriptyline (ELAVIL) 10 MG tablet Take 1 tablet (10 mg total) by mouth at bedtime. 09/10/18  Yes Shawnee Knapp, MD  cyclobenzaprine (FLEXERIL) 10 MG tablet Take 1 tablet (10 mg total) by mouth at bedtime. 09/10/18  Yes Shawnee Knapp, MD  DULoxetine (CYMBALTA) 60 MG capsule Take 1 capsule (60 mg total) by mouth daily. 12/13/18  Yes Shawnee Knapp, MD  gabapentin (NEURONTIN) 300 MG capsule Take 2 capsules (600 mg total) by mouth 3 (three) times daily. 12/13/18  Yes Shawnee Knapp, MD  HYDROcodone-acetaminophen [facility]) 10-325 MG tablet Take 1 tablet by mouth every 4 (four) hours as needed (pain). 12/13/18  Yes Shawnee Knapp, MD  omeprazole (PRILOSEC) 20 MG capsule TAKE 1 CAPSULE BY MOUTH EVERY DAY 11/25/18   Yes Rutherford Guys, MD  promethazine (PHENERGAN) 25 MG tablet Take 1 tablet (25 mg total) by mouth every 8 (eight) hours as needed for nausea or vomiting. 10/15/18  Yes Rutherford Guys, MD  verapamil (CALAN-SR) 180 MG CR tablet TAKE 1 TABLET (180 MG TOTAL) BY MOUTH AT BEDTIME. 12/13/18  Yes Shawnee Knapp, MD  Vitamin D, Ergocalciferol, (DRISDOL) 1.25 MG (50000 UT) CAPS capsule Take 1 capsule (50,000 Units total) by mouth every 7 (seven) days. 12/13/18  Yes Shawnee Knapp, MD    Past Medical History:  Diagnosis Date  . Arthritis   . Complication of anesthesia    "can't stand mask over face." " It makes my head hurt"  . Fibromyalgia   . Headache(784.0)    Migraines  . Hypertension   . Multiple sites of bowel obstruction ([place]) 2005  . Uterine fibroid     Past Surgical History:  Procedure Laterality Date  . CHOLECYSTECTOMY    . COLON RESECTION  1990  . endometrial curretage  2005  . GANGLION CYST EXCISION Right 2001   hand  . HERNIA REPAIR Left    inguinal  . TUBAL LIGATION      Social History   Tobacco Use  . Smoking status: Former Smoker    Years: 8.00    Last attempt to quit: 10/08/1987  Years since quitting: 31.2  . Smokeless tobacco: Never Used  Substance Use Topics  . Alcohol use: Yes    Alcohol/week: 0.0 standard drinks    Comment: 1 glass of wine occasionally    Family History  Problem Relation Age of Onset  . Healthy Mother     ROS Per hpi  OBJECTIVE:  Blood pressure 120/74, pulse 82, temperature 98.2 F (36.8 C), temperature source Oral, resp. rate 18, height 5\' 5"  (1.651 m), weight 171 lb 3.2 oz (77.7 kg), SpO2 96 %. Body mass index is 28.49 kg/m.   Physical Exam Vitals signs and nursing note reviewed.  Constitutional:      Appearance: She is well-developed.  HENT:     Head: Normocephalic and atraumatic.     Right Ear: Hearing, tympanic membrane, ear canal and external ear normal.     Left Ear: Hearing, tympanic membrane, ear canal and external ear  normal.  Eyes:     Conjunctiva/sclera: Conjunctivae normal.     Pupils: Pupils are equal, round, and reactive to light.  Neck:     Musculoskeletal: Neck supple.  Cardiovascular:     Rate and Rhythm: Normal rate and regular rhythm.     Heart sounds: Normal heart sounds. No murmur. No friction rub. No gallop.   Pulmonary:     Effort: Pulmonary effort is normal.     Breath sounds: Normal breath sounds. No wheezing or rales.  Abdominal:     General: Bowel sounds are normal. There is no distension.     Palpations: Abdomen is soft. There is no mass.     Tenderness: There is no abdominal tenderness.  Lymphadenopathy:     Cervical: No cervical adenopathy.  Skin:    General: Skin is warm and dry.  Neurological:     Mental Status: She is alert and oriented to person, place, and time.      Results for orders placed or performed in visit on 12/21/18 (from the past 24 hour(s))  POC Influenza A&B(BINAX/QUICKVUE)     Status: Normal   Collection Time: 12/21/18 12:40 PM  Result Value Ref Range   Influenza A, POC Negative Negative   Influenza B, POC Negative Negative     ASSESSMENT and PLAN  1. Acute viral syndrome Discussed supportive measures, OTC meds and RTC precautions  2. Fatigue, unspecified type - POC Influenza A&B(BINAX/QUICKVUE)  3. Encounter for screening mammogram for breast cancer - MM DIGITAL SCREENING BILATERAL; Future  Return if symptoms worsen or fail to improve.    Rutherford Guys, MD Primary Care at [facility] [place], [place] [address] Ph.  [phone] Fax [phone]

## 2018-12-22 NOTE — Telephone Encounter (Signed)
Requested medication (s) are due for refill today: yes  Requested medication (s) are on the active medication list: No  Last refill:  07/23/18  Future visit scheduled: No  Notes to clinic:  Rx end date 09/10/18   Requested Prescriptions  Pending Prescriptions Disp Refills   butalbital-acetaminophen-caffeine (FIORICET, ESGIC) 50-325-40 MG tablet [Pharmacy Med Name: BUTALB-ACETAMIN-CAFF 50-325-40] 80 tablet 0    Sig: Take 1-2 tablets by mouth every 6 (six) hours as needed for headache.     Not Delegated - Analgesics:  Non-Opioid Analgesic Combinations Failed - 12/21/2018  3:17 PM      Failed - This refill cannot be delegated      Passed - Valid encounter within last 12 months    Recent Outpatient Visits          Yesterday Acute viral syndrome   [facility], Lilia Argue, MD   2 months ago Viral gastroenteritis   [facility], Lilia Argue, MD   3 months ago Chronic daily headache   [facility], Laurey Arrow, MD   6 months ago Arthralgia of left knee   [facility], Laurey Arrow, MD   7 months ago Polymyalgia rheumatica [facility])   [facility], Laurey Arrow, MD

## 2018-12-22 NOTE — Telephone Encounter (Signed)
Pharmacy is requesting a 90 supply of gabapentin.

## 2019-01-04 ENCOUNTER — Ambulatory Visit: Payer: BLUE CROSS/BLUE SHIELD | Admitting: Family Medicine
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
## 2019-11-02 ENCOUNTER — Other Ambulatory Visit: Payer: Self-pay | Admitting: Family Medicine

## 2019-11-02 ENCOUNTER — Ambulatory Visit
Admission: RE | Admit: 2019-11-02 | Discharge: 2019-11-02 | Disposition: A | Discharge status: Home / Self Care | Payer: No Typology Code available for payment source | Source: Ambulatory Visit | Attending: Family Medicine | Admitting: Family Medicine

## 2019-11-02 DIAGNOSIS — R52 Pain, unspecified: Secondary | ICD-10-CM

## 2020-02-25 ENCOUNTER — Ambulatory Visit: Payer: Self-pay | Admitting: Orthopedic Surgery

## 2020-02-29 ENCOUNTER — Ambulatory Visit: Payer: Self-pay | Admitting: Orthopedic Surgery

## 2020-02-29 NOTE — H&P (Signed)
Subjective:    Diane [name] is a pleasant 64 year old female who has been suffering from Low back pain and radicular left leg pain Since February 2021 When she had a fall at work. She continues to have ongoing pain despite Medrol Dosepak, formalized physical therapy, and narcotic pain medication prescribed by her PCP as well as gabapentin. Her pain continues to be debilitating affecting her quality-of-life and therefore she would like to move forward with surgical intervention. She is scheduled for Left L5-S1 Discectomy on 03/09/20 With Dr. Rolena Infante at [facility].  Patient Active Problem List   Diagnosis Date Noted  . Adrenal nodule ([place]) 09/27/2016  . Fibromyalgia 02/01/2016  . Arthritis, senescent 02/01/2016  . Vitamin D deficiency 02/01/2016  . Depression 08/31/2015  . Leukopenia 08/31/2015  . Polypharmacy 08/31/2015  . Migraine headache without aura 12/31/2012  . Hypertension 12/31/2012  . Insomnia 12/31/2012   Past Medical History:  Diagnosis Date  . Arthritis   . Complication of anesthesia    "can't stand mask over face." " It makes my head hurt"  . Fibromyalgia   . Headache(784.0)    Migraines  . Hypertension   . Multiple sites of bowel obstruction ([place]) 2005  . Uterine fibroid     Past Surgical History:  Procedure Laterality Date  . CHOLECYSTECTOMY    . COLON RESECTION  1990  . endometrial curretage  2005  . GANGLION CYST EXCISION Right 2001   hand  . HERNIA REPAIR Left    inguinal  . TUBAL LIGATION      Current Outpatient Medications  Medication Sig Dispense Refill Last Dose  . amitriptyline (ELAVIL) 10 MG tablet Take 1 tablet (10 mg total) by mouth at bedtime. 90 tablet 1   . cyclobenzaprine (FLEXERIL) 10 MG tablet Take 1 tablet (10 mg total) by mouth at bedtime. 30 tablet 3   . DULoxetine (CYMBALTA) 60 MG capsule Take 1 capsule (60 mg total) by mouth daily. 90 capsule 1   . gabapentin (NEURONTIN) 300 MG capsule TAKE 2 CAPSULES (600 MG TOTAL) BY MOUTH 3 (THREE)  TIMES DAILY. 540 capsule 1   . HYDROcodone-acetaminophen (NORCO) 10-325 MG tablet Take 1 tablet by mouth every 4 (four) hours as needed (pain). 120 tablet 0   . omeprazole (PRILOSEC) 20 MG capsule TAKE 1 CAPSULE BY MOUTH EVERY DAY 90 capsule 1   . promethazine (PHENERGAN) 25 MG tablet Take 1 tablet (25 mg total) by mouth every 8 (eight) hours as needed for nausea or vomiting. 20 tablet 0   . verapamil (CALAN-SR) 180 MG CR tablet TAKE 1 TABLET (180 MG TOTAL) BY MOUTH AT BEDTIME. 90 tablet 1   . Vitamin D, Ergocalciferol, (DRISDOL) 1.25 MG (50000 UT) CAPS capsule Take 1 capsule (50,000 Units total) by mouth every 7 (seven) days. 12 capsule 1    Current Facility-Administered Medications  Medication Dose Route Frequency Provider Last Rate Last Admin  . triamcinolone acetonide (KENALOG-40) injection 40 mg  40 mg Intra-articular Once Shawnee Knapp, MD       Allergies  Allergen Reactions  . Topamax [Topiramate] Hives  . Dilaudid [Hydromorphone Hcl] Hives    Social History   Tobacco Use  . Smoking status: Former Smoker    Years: 8.00    Quit date: 10/08/1987    Years since quitting: 32.4  . Smokeless tobacco: Never Used  Substance Use Topics  . Alcohol use: Yes    Alcohol/week: 0.0 standard drinks    Comment: 1 glass of wine occasionally  Family History  Problem Relation Age of Onset  . Healthy Mother     Review of Systems As stated in HPI  Objective:   Vitals: Ht: 5 ft 5 in 02/29/2020 08:40 am BP: 135/94 sitting L arm 02/29/2020 08:43 am Pulse: 100 bpm 02/29/2020 08:43 am  General: AAOX3, well developed and well nourished, NAD  Ambulation: normal gait pattern, uses no assistive device.  Inspection: No obvious deformity, scoleosis, kyphosis, loss of lordotic curve.  Heart: Regular rate and rhythm, no rubs, murmurs, or gallops  Lungs: Clear to auscultation bilaterally  Abdomen: Bowel sounds 4, nontender, no rebound tenderness, nondistended. No loss of bladder or bowel  control.  Palpation: Tender over spinous processes Local address a swollen  AROM: - Mild pain with range of motion of lumbar spine - Knee: flexion and extension normal and pain free bilaterally. - Ankle: Dorsiflexion, plantarflexion, inversion, eversion normal and pain free.  Dermatomes: Patient complains of pain and dysesthesias in the left S1 dermatome pattern  Myotomes: - Hip Flexion: Left 5/5, Right 5/5 - Knee Extension: Left 5/5, Right 5/5 - Ankle Dorsiflextion: Left 5/5, Right 5/5 - Ankle Plantarflexion: Left 5/5, Right 5/5  Reflexes: - Babinski: Left Ngative, Right Negative - Clonus: Negative  Special Tests: - Straight Leg Raise: Left Positive, Right Negative PV: Extremities warm and well profused. Posterior and dorsalis pedis pulse 2+ bilaterally, No pitting Edema, discoloration, calf tenderness   Lumbar MRI: completed on 01/19/20 was reviewed with the patient. It was completed at [facility]; I have independently reviewed the images as well as the radiology report. Mild scoliosis of the lumbar spine but no evidence of fracture or abnormal marrow replacing lesion. Severe left lateral disc osteophyte complex at L2-3 with mild left disc height loss. Moderate left neuroforaminal narrowing which contacts but does not compress the L2 exiting nerve root. Mild spinal stenosis. L3-4: Mild to moderate right neuroforaminal narrowing causing some contact to the L3 nerve root. Mild spinal stenosis. L4-5: Moderate degenerative disc disease with loss of disc space height. Moderate to severe right neuroforaminal narrowing causing impingement and mild mass-effect on the exiting L4 nerve root. L5-S1: Moderate large left disc extrusion producing compression of the traversing S1 nerve root no significant foraminal stenosis noted.  Assessment:    Annarosa continues to have severe left radicular leg pain in the S1 dermatome. While she has 5/5 motor strength she does have numbness,  dysesthesias, and a positive straight leg raise test with reproduction of S1 nerve pain on the left side. Her back pain while present is not as debilitating as the radicular left leg pain. Based on her clinical exam, and MRI I do believe her primary pain generator is the L5-S1 disc herniation posterior lateral to the left.  Plan:   I have gone into great detail discussing treatment options which could be a lumbar microdiscectomy versus a selective nerve root block. I did indicate now that we are 3-1/2 months status post her injury the surgery provides the most predictable relief of her leg pain. I have gone over the surgery in great detail as well as the risks and benefits. Risks and benefits of decompression/discectomy: Infection, bleeding, death, stroke, paralysis, ongoing or worse pain, need for additional surgery, leak of spinal fluid, adjacent segment degeneration requiring additional surgery, post-operative hematoma formation that can result in neurological compromise and the need for urgent/emergent re-operation. Loss in bowel and bladder control. Injury to major vessels that could result in the need for urgent abdominal surgery to stop  bleeding. Risk of deep venous thrombosis (DVT) and the need for additional treatment. Recurrent disc herniation resulting in the need for revision surgery, which could include fusion surgery (utilizing instrumentation such as pedicle screws and intervertebral cages).   Additional risk: If instrumentation is used there is a risk of migration, or breakage of that hardware that could require additional surgery.   We obtained preoperative medical clearance from the patient's primary care provider.   I have reviewed the patient's medications with her. She is not on any blood thinners, she does not take aspirin. She is not using any over-the-counter anti-inflammatory medication and have advised her not to use any anti-inflammatory medications leading up to surgery. She  did express understanding of this.   Patient is scheduled for LSO brace fitting on 03/02/2020 with physical therapy.   We have also discussed the post-operative recovery period to include: bathing/showering restrictions, wound healing, activity (and driving) restrictions, medications/pain mangement.   We have also discussed post-operative redflags to include: signs and symptoms of postoperative infection, DVT/PE.   All of patient's questions were invited and answered.   Follow-up: 2 weeks postoperatively

## 2020-03-01 NOTE — Progress Notes (Signed)
[id number] [name], [place] [address] PhoneSE:[phone] FaxXO:[phone]      Your procedure is scheduled on Thursday 03/09/2020.  Report to [facility] Entrance "A" at 12:00 P.M., and check in at the Admitting office.  Call this number if you have problems the morning of surgery:  [phone]  Call [phone] if you have any questions prior to your surgery date Monday-Friday 8am-4pm    Remember:  Do not eat or drink after midnight the night before your surgery   Take these medicines the morning of surgery with A SIP OF WATER: Duloxetine (Cymbalta) Gabapentin (Neurontin) Hydrocodone-acetaminophen (Norco) - if needed Sumatriptan (Imitrex) - if needed   As of today, STOP taking any Aspirin (unless otherwise instructed by your surgeon) and Aspirin containing products, Aleve, Naproxen, Ibuprofen, Motrin, Advil, Goody's, BC's, all herbal medications, fish oil, and all vitamins.                      Do not wear jewelry, make up, or nail polish            Do not wear lotions, powders, perfumes, or deodorant.            Do not shave 48 hours prior to surgery.              Do not bring valuables to the hospital.            [facility] is not responsible for any belongings or valuables.  Do NOT Smoke (Tobacco/Vapping) or drink Alcohol 24 hours prior to your procedure  If you use a CPAP at night, you may bring all equipment for your overnight stay.   Contacts, glasses, dentures or bridgework may not be worn into surgery.      For patients admitted to the hospital, discharge time will be determined by your treatment team.   Patients discharged the day of surgery will not be allowed to drive home, and someone needs to stay with them for 24 hours.    Special instructions:   [place]- Preparing For Surgery  Before surgery, you can play an important role. Because skin is not sterile, your skin needs to be as free  of germs as possible. You can reduce the number of germs on your skin by washing with CHG (chlorahexidine gluconate) Soap before surgery.  CHG is an antiseptic cleaner which kills germs and bonds with the skin to continue killing germs even after washing.    Oral Hygiene is also important to reduce your risk of infection.  Remember - BRUSH YOUR TEETH THE MORNING OF SURGERY WITH YOUR REGULAR TOOTHPASTE  Please do not use if you have an allergy to CHG or antibacterial soaps. If your skin becomes reddened/irritated stop using the CHG.  Do not shave (including legs and underarms) for at least 48 hours prior to first CHG shower. It is OK to shave your face.  Please follow these instructions carefully.   1. Shower the NIGHT BEFORE SURGERY and the MORNING OF SURGERY with CHG Soap.   2. If you chose to wash your hair, wash your hair first as usual with your normal shampoo.  3. After you shampoo, rinse your hair and body thoroughly to remove the shampoo.  4. Use CHG as you would any other liquid soap. You can apply CHG directly to the skin and wash gently with a scrungie or a clean washcloth.   5. Apply  the CHG Soap to your body ONLY FROM THE NECK DOWN.  Do not use on open wounds or open sores. Avoid contact with your eyes, ears, mouth and genitals (private parts). Wash Face and genitals (private parts)  with your normal soap.   6. Wash thoroughly, paying special attention to the area where your surgery will be performed.  7. Thoroughly rinse your body with warm water from the neck down.  8. DO NOT shower/wash with your normal soap after using and rinsing off the CHG Soap.  9. Pat yourself dry with a CLEAN TOWEL.  10. Wear CLEAN PAJAMAS to bed the night before surgery, wear comfortable clothes the morning of surgery  11. Place CLEAN SHEETS on your bed the night of your first shower and DO NOT SLEEP WITH PETS.   Day of Surgery: Shower with CHG soap as directed. Do not apply any  deodorants/lotions.  Please wear clean clothes to the hospital/surgery center.   Remember to brush your teeth WITH YOUR REGULAR TOOTHPASTE.   Please read over the following fact sheets that you were given.

## 2020-03-02 ENCOUNTER — Ambulatory Visit (HOSPITAL_COMMUNITY)
Admission: RE | Admit: 2020-03-02 | Discharge: 2020-03-02 | Disposition: A | Discharge status: Home / Self Care | Payer: No Typology Code available for payment source | Source: Ambulatory Visit | Attending: Orthopedic Surgery | Admitting: Orthopedic Surgery

## 2020-03-02 ENCOUNTER — Encounter (HOSPITAL_COMMUNITY)
Admission: RE | Admit: 2020-03-02 | Discharge: 2020-03-02 | Disposition: A | Discharge status: Home / Self Care | Payer: No Typology Code available for payment source | Source: Ambulatory Visit | Attending: Orthopedic Surgery | Admitting: Orthopedic Surgery

## 2020-03-02 ENCOUNTER — Encounter (HOSPITAL_COMMUNITY): Payer: Self-pay

## 2020-03-02 ENCOUNTER — Other Ambulatory Visit: Payer: Self-pay

## 2020-03-02 DIAGNOSIS — Z01818 Encounter for other preprocedural examination: Secondary | ICD-10-CM

## 2020-03-02 HISTORY — DX: Depression, unspecified: F32.A

## 2020-03-02 HISTORY — DX: Carpal tunnel syndrome, bilateral upper limbs: G56.03

## 2020-03-02 HISTORY — DX: Anxiety disorder, unspecified: F41.9

## 2020-03-02 HISTORY — DX: Gastro-esophageal reflux disease without esophagitis: K21.9

## 2020-03-02 LAB — BASIC METABOLIC PANEL
Anion gap: 10 (ref 5–15)
BUN: 10 mg/dL (ref 8–23)
CO2: 26 mmol/L (ref 22–32)
Calcium: 9.3 mg/dL (ref 8.9–10.3)
Chloride: 105 mmol/L (ref 98–111)
Creatinine, Ser: 0.63 mg/dL (ref 0.44–1.00)
GFR calc Af Amer: >60 mL/min (ref >60)
GFR calc non Af Amer: >60 mL/min (ref >60)
Glucose, Bld: 100 mg/dL — ABNORMAL HIGH (ref 70–99)
Potassium: 3.4 mmol/L — ABNORMAL LOW (ref 3.5–5.1)
Sodium: 141 mmol/L (ref 135–145)

## 2020-03-02 LAB — CBC
HCT: 40.9 % (ref 36.0–46.0)
Hemoglobin: 13.4 g/dL (ref 12.0–15.0)
MCH: 28.5 pg (ref 26.0–34.0)
MCHC: 32.8 g/dL (ref 30.0–36.0)
MCV: 87 fL (ref 80.0–100.0)
Platelets: 321 10*3/uL (ref 150–400)
RBC: 4.7 MIL/uL (ref 3.87–5.11)
RDW: 12.2 % (ref 11.5–15.5)
WBC: 5.1 10*3/uL (ref 4.0–10.5)
nRBC: 0 % (ref 0.0–0.2)

## 2020-03-02 LAB — APTT: aPTT: 25 seconds (ref 24–36)

## 2020-03-02 LAB — SURGICAL PCR SCREEN
MRSA, PCR: NEGATIVE
Staphylococcus aureus: POSITIVE — AB

## 2020-03-02 LAB — URINALYSIS, MICROSCOPIC (REFLEX)

## 2020-03-02 LAB — URINALYSIS, ROUTINE W REFLEX MICROSCOPIC
Bilirubin Urine: NEGATIVE
Glucose, UA: NEGATIVE mg/dL
Ketones, ur: 15 mg/dL — AB
Nitrite: NEGATIVE
Protein, ur: NEGATIVE mg/dL
Specific Gravity, Urine: >1.03 — ABNORMAL HIGH (ref 1.005–1.030)
pH: 6 (ref 5.0–8.0)

## 2020-03-02 LAB — PROTIME-INR
INR: 1 (ref 0.8–1.2)
Prothrombin Time: 12.5 seconds (ref 11.4–15.2)

## 2020-03-02 NOTE — Progress Notes (Signed)
Called surgeon's office, left message regarding UA results.  UA results also routed to Dr. Rolena Infante.

## 2020-03-02 NOTE — Progress Notes (Signed)
PCP: Dr. Brigitte Pulse Cardiologist: denies  EKG: today CXR: today ECHO: denies Stress Test: denies Cardiac Cath: denies  BP today 130/94--has not had BP meds or pain meds yet this AM, will be taking this morning Called [facility] with anesthesia, will forward chart for review per MD order, no need to see during appt today.  Covid screening scheduled for June 1 at [facility]  Patient denies shortness of breath, fever, cough, and chest pain at PAT appointment.  Patient verbalized understanding of instructions provided today at the PAT appointment.  Patient asked to review instructions at home and day of surgery.

## 2020-03-07 ENCOUNTER — Other Ambulatory Visit (HOSPITAL_COMMUNITY)
Admission: RE | Admit: 2020-03-07 | Discharge: 2020-03-07 | Disposition: A | Discharge status: Home / Self Care | Payer: No Typology Code available for payment source | Source: Ambulatory Visit | Attending: Orthopedic Surgery | Admitting: Orthopedic Surgery

## 2020-03-07 DIAGNOSIS — Z01812 Encounter for preprocedural laboratory examination: Secondary | ICD-10-CM | POA: Insufficient documentation

## 2020-03-07 DIAGNOSIS — Z20822 Contact with and (suspected) exposure to covid-19: Secondary | ICD-10-CM | POA: Diagnosis not present

## 2020-03-08 LAB — SARS CORONAVIRUS 2 (TAT 6-24 HRS): SARS Coronavirus 2: NEGATIVE

## 2020-03-09 ENCOUNTER — Encounter (HOSPITAL_COMMUNITY): Payer: Self-pay | Admitting: Orthopedic Surgery

## 2020-03-09 ENCOUNTER — Other Ambulatory Visit: Payer: Self-pay

## 2020-03-09 ENCOUNTER — Ambulatory Visit (HOSPITAL_COMMUNITY): Payer: No Typology Code available for payment source | Admitting: Anesthesiology

## 2020-03-09 ENCOUNTER — Ambulatory Visit (HOSPITAL_COMMUNITY): Payer: No Typology Code available for payment source

## 2020-03-09 ENCOUNTER — Observation Stay (HOSPITAL_COMMUNITY)
Admission: RE | Admit: 2020-03-09 | Discharge: 2020-03-10 | Disposition: A | Discharge status: Home / Self Care | Payer: No Typology Code available for payment source | Source: Ambulatory Visit | Attending: Orthopedic Surgery | Admitting: Orthopedic Surgery

## 2020-03-09 ENCOUNTER — Ambulatory Visit (HOSPITAL_COMMUNITY): Payer: No Typology Code available for payment source | Admitting: Physician Assistant

## 2020-03-09 ENCOUNTER — Encounter (HOSPITAL_COMMUNITY)
Admission: RE | Disposition: A | Discharge status: Home / Self Care | Payer: Self-pay | Source: Ambulatory Visit | Attending: Orthopedic Surgery

## 2020-03-09 DIAGNOSIS — Z885 Allergy status to narcotic agent status: Secondary | ICD-10-CM | POA: Diagnosis not present

## 2020-03-09 DIAGNOSIS — M5117 Intervertebral disc disorders with radiculopathy, lumbosacral region: Secondary | ICD-10-CM | POA: Diagnosis present

## 2020-03-09 DIAGNOSIS — Z419 Encounter for procedure for purposes other than remedying health state, unspecified: Secondary | ICD-10-CM

## 2020-03-09 DIAGNOSIS — M797 Fibromyalgia: Secondary | ICD-10-CM | POA: Diagnosis not present

## 2020-03-09 DIAGNOSIS — Z87891 Personal history of nicotine dependence: Secondary | ICD-10-CM | POA: Diagnosis not present

## 2020-03-09 DIAGNOSIS — Z79899 Other long term (current) drug therapy: Secondary | ICD-10-CM | POA: Diagnosis not present

## 2020-03-09 DIAGNOSIS — M1990 Unspecified osteoarthritis, unspecified site: Secondary | ICD-10-CM | POA: Insufficient documentation

## 2020-03-09 DIAGNOSIS — I10 Essential (primary) hypertension: Secondary | ICD-10-CM | POA: Diagnosis not present

## 2020-03-09 DIAGNOSIS — G43909 Migraine, unspecified, not intractable, without status migrainosus: Secondary | ICD-10-CM | POA: Diagnosis not present

## 2020-03-09 DIAGNOSIS — K219 Gastro-esophageal reflux disease without esophagitis: Secondary | ICD-10-CM | POA: Diagnosis not present

## 2020-03-09 DIAGNOSIS — M5126 Other intervertebral disc displacement, lumbar region: Secondary | ICD-10-CM | POA: Diagnosis present

## 2020-03-09 DIAGNOSIS — Z888 Allergy status to other drugs, medicaments and biological substances status: Secondary | ICD-10-CM | POA: Insufficient documentation

## 2020-03-09 HISTORY — PX: LUMBAR LAMINECTOMY/DECOMPRESSION MICRODISCECTOMY: SHX5026

## 2020-03-09 SURGERY — LUMBAR LAMINECTOMY/DECOMPRESSION MICRODISCECTOMY 1 LEVEL
Anesthesia: General | Site: Spine Lumbar | Laterality: Left

## 2020-03-09 MED ORDER — IRBESARTAN 150 MG PO TABS
150.0000 mg | ORAL_TABLET | Freq: Every day | ORAL | Status: DC
  Filled 2020-03-09: qty 1

## 2020-03-09 MED ORDER — HEMOSTATIC AGENTS (NO CHARGE) OPTIME
TOPICAL | Status: DC | PRN
  Administered 2020-03-09 (×2): 1 via TOPICAL

## 2020-03-09 MED ORDER — METHYLPREDNISOLONE ACETATE 40 MG/ML INJ SUSP (RADIOLOG
INTRAMUSCULAR | Status: DC | PRN
  Administered 2020-03-09: 120 mg

## 2020-03-09 MED ORDER — DULOXETINE HCL 30 MG PO CPEP: 60.0000 mg | ORAL_CAPSULE | Freq: Every day | ORAL | Status: DC

## 2020-03-09 MED ORDER — ONDANSETRON HCL 4 MG PO TABS: 4.0000 mg | ORAL_TABLET | Freq: Four times a day (QID) | ORAL | Status: DC | PRN

## 2020-03-09 MED ORDER — FLEET ENEMA 7-19 GM/118ML RE ENEM: 1.0000 | ENEMA | Freq: Once | RECTAL | Status: DC | PRN

## 2020-03-09 MED ORDER — CHLORHEXIDINE GLUCONATE 0.12 % MT SOLN
OROMUCOSAL | Status: AC
  Administered 2020-03-09: 15 mL via OROMUCOSAL
  Filled 2020-03-09: qty 15

## 2020-03-09 MED ORDER — HYDROMORPHONE HCL 1 MG/ML IJ SOLN
INTRAMUSCULAR | Status: AC
  Filled 2020-03-09: qty 1

## 2020-03-09 MED ORDER — LIDOCAINE 2% (20 MG/ML) 5 ML SYRINGE
INTRAMUSCULAR | Status: AC
  Filled 2020-03-09: qty 5

## 2020-03-09 MED ORDER — AMLODIPINE-OLMESARTAN 5-20 MG PO TABS: 1.0000 | ORAL_TABLET | Freq: Every day | ORAL | Status: DC

## 2020-03-09 MED ORDER — PROPOFOL 10 MG/ML IV BOLUS
INTRAVENOUS | Status: DC | PRN
  Administered 2020-03-09: 130 mg via INTRAVENOUS
  Administered 2020-03-09: 20 mg via INTRAVENOUS

## 2020-03-09 MED ORDER — BUPIVACAINE HCL (PF) 0.25 % IJ SOLN
INTRAMUSCULAR | Status: AC
  Filled 2020-03-09: qty 30

## 2020-03-09 MED ORDER — METHOCARBAMOL 1000 MG/10ML IJ SOLN
500.0000 mg | Freq: Four times a day (QID) | INTRAVENOUS | Status: DC | PRN
  Filled 2020-03-09: qty 5

## 2020-03-09 MED ORDER — CEFAZOLIN SODIUM-DEXTROSE 2-4 GM/100ML-% IV SOLN
2.0000 g | INTRAVENOUS | Status: AC
  Administered 2020-03-09: 2 g via INTRAVENOUS

## 2020-03-09 MED ORDER — ONDANSETRON HCL 4 MG/2ML IJ SOLN
INTRAMUSCULAR | Status: DC | PRN
  Administered 2020-03-09: 4 mg via INTRAVENOUS

## 2020-03-09 MED ORDER — AMLODIPINE BESYLATE 5 MG PO TABS: 5.0000 mg | ORAL_TABLET | Freq: Every day | ORAL | Status: DC

## 2020-03-09 MED ORDER — MIDAZOLAM HCL 2 MG/2ML IJ SOLN
INTRAMUSCULAR | Status: AC
  Filled 2020-03-09: qty 2

## 2020-03-09 MED ORDER — METHYLPREDNISOLONE ACETATE 40 MG/ML IJ SUSP
INTRAMUSCULAR | Status: AC
  Filled 2020-03-09: qty 1

## 2020-03-09 MED ORDER — AMITRIPTYLINE HCL 25 MG PO TABS
25.0000 mg | ORAL_TABLET | Freq: Every day | ORAL | Status: DC
  Administered 2020-03-09: 25 mg via ORAL
  Filled 2020-03-09: qty 1

## 2020-03-09 MED ORDER — ACETAMINOPHEN 325 MG PO TABS: 650.0000 mg | ORAL_TABLET | ORAL | Status: DC | PRN

## 2020-03-09 MED ORDER — PROMETHAZINE HCL 25 MG/ML IJ SOLN: 6.2500 mg | INTRAMUSCULAR | Status: DC | PRN

## 2020-03-09 MED ORDER — SODIUM CHLORIDE 0.9% FLUSH
3.0000 mL | Freq: Two times a day (BID) | INTRAVENOUS | Status: DC
  Administered 2020-03-09: 3 mL via INTRAVENOUS

## 2020-03-09 MED ORDER — ORAL CARE MOUTH RINSE: 15.0000 mL | Freq: Once | OROMUCOSAL | Status: AC

## 2020-03-09 MED ORDER — LACTATED RINGERS IV SOLN: INTRAVENOUS | Status: DC

## 2020-03-09 MED ORDER — 0.9 % SODIUM CHLORIDE (POUR BTL) OPTIME
TOPICAL | Status: DC | PRN
  Administered 2020-03-09: 2000 mL

## 2020-03-09 MED ORDER — MEPERIDINE HCL 25 MG/ML IJ SOLN: 6.2500 mg | INTRAMUSCULAR | Status: DC | PRN

## 2020-03-09 MED ORDER — METHOCARBAMOL 500 MG PO TABS
500.0000 mg | ORAL_TABLET | Freq: Four times a day (QID) | ORAL | Status: DC | PRN
  Administered 2020-03-09 – 2020-03-10 (×3): 500 mg via ORAL
  Filled 2020-03-09 (×3): qty 1

## 2020-03-09 MED ORDER — THROMBIN 20000 UNITS EX SOLR
CUTANEOUS | Status: AC
  Filled 2020-03-09: qty 20000

## 2020-03-09 MED ORDER — CEFAZOLIN SODIUM-DEXTROSE 1-4 GM/50ML-% IV SOLN
1.0000 g | Freq: Three times a day (TID) | INTRAVENOUS | Status: AC
  Administered 2020-03-09 – 2020-03-10 (×2): 1 g via INTRAVENOUS
  Filled 2020-03-09 (×2): qty 50

## 2020-03-09 MED ORDER — PROPOFOL 10 MG/ML IV BOLUS
INTRAVENOUS | Status: AC
  Filled 2020-03-09: qty 20

## 2020-03-09 MED ORDER — PHENYLEPHRINE 40 MCG/ML (10ML) SYRINGE FOR IV PUSH (FOR BLOOD PRESSURE SUPPORT)
PREFILLED_SYRINGE | INTRAVENOUS | Status: DC | PRN
  Administered 2020-03-09: 80 ug via INTRAVENOUS
  Administered 2020-03-09: 120 ug via INTRAVENOUS
  Administered 2020-03-09 (×2): 80 ug via INTRAVENOUS

## 2020-03-09 MED ORDER — CHLORHEXIDINE GLUCONATE 0.12 % MT SOLN: 15.0000 mL | Freq: Once | OROMUCOSAL | Status: AC

## 2020-03-09 MED ORDER — MIDAZOLAM HCL 5 MG/5ML IJ SOLN
INTRAMUSCULAR | Status: DC | PRN
  Administered 2020-03-09: 2 mg via INTRAVENOUS

## 2020-03-09 MED ORDER — PHENYLEPHRINE HCL-NACL 10-0.9 MG/250ML-% IV SOLN
INTRAVENOUS | Status: DC | PRN
Start: 2020-03-09 — End: 2020-03-09
  Administered 2020-03-09: 25 ug/min via INTRAVENOUS

## 2020-03-09 MED ORDER — THROMBIN 20000 UNITS EX SOLR: CUTANEOUS | Status: DC | PRN

## 2020-03-09 MED ORDER — GABAPENTIN 300 MG PO CAPS
600.0000 mg | ORAL_CAPSULE | Freq: Every day | ORAL | Status: DC
  Administered 2020-03-09: 600 mg via ORAL
  Filled 2020-03-09: qty 2

## 2020-03-09 MED ORDER — METHOCARBAMOL 500 MG PO TABS: 500.0000 mg | ORAL_TABLET | Freq: Three times a day (TID) | ORAL | 0 refills | Status: AC | PRN

## 2020-03-09 MED ORDER — ACETAMINOPHEN 650 MG RE SUPP: 650.0000 mg | RECTAL | Status: DC | PRN

## 2020-03-09 MED ORDER — HYDROMORPHONE HCL 1 MG/ML IJ SOLN
INTRAMUSCULAR | Status: DC | PRN
  Administered 2020-03-09: 1 mg via INTRAVENOUS

## 2020-03-09 MED ORDER — SODIUM CHLORIDE 0.9% FLUSH: 3.0000 mL | INTRAVENOUS | Status: DC | PRN

## 2020-03-09 MED ORDER — DEXAMETHASONE SODIUM PHOSPHATE 10 MG/ML IJ SOLN
INTRAMUSCULAR | Status: DC | PRN
Start: 2020-03-09 — End: 2020-03-09
  Administered 2020-03-09: 10 mg via INTRAVENOUS

## 2020-03-09 MED ORDER — ONDANSETRON HCL 4 MG/2ML IJ SOLN
INTRAMUSCULAR | Status: AC
  Filled 2020-03-09: qty 2

## 2020-03-09 MED ORDER — OXYCODONE HCL 5 MG PO TABS
10.0000 mg | ORAL_TABLET | ORAL | Status: DC | PRN
  Administered 2020-03-09 – 2020-03-10 (×5): 10 mg via ORAL
  Filled 2020-03-09 (×5): qty 2

## 2020-03-09 MED ORDER — ONDANSETRON HCL 4 MG/2ML IJ SOLN: 4.0000 mg | Freq: Four times a day (QID) | INTRAMUSCULAR | Status: DC | PRN

## 2020-03-09 MED ORDER — CEFAZOLIN SODIUM-DEXTROSE 2-4 GM/100ML-% IV SOLN
INTRAVENOUS | Status: AC
  Filled 2020-03-09: qty 100

## 2020-03-09 MED ORDER — OXYCODONE-ACETAMINOPHEN 10-325 MG PO TABS: 1.0000 | ORAL_TABLET | Freq: Four times a day (QID) | ORAL | 0 refills | Status: AC | PRN

## 2020-03-09 MED ORDER — ROCURONIUM BROMIDE 10 MG/ML (PF) SYRINGE
PREFILLED_SYRINGE | INTRAVENOUS | Status: DC | PRN
  Administered 2020-03-09: 70 mg via INTRAVENOUS

## 2020-03-09 MED ORDER — OXYCODONE HCL 5 MG PO TABS: 5.0000 mg | ORAL_TABLET | ORAL | Status: DC | PRN

## 2020-03-09 MED ORDER — EPINEPHRINE PF 1 MG/ML IJ SOLN
INTRAMUSCULAR | Status: AC
  Filled 2020-03-09: qty 1

## 2020-03-09 MED ORDER — ONDANSETRON HCL 4 MG PO TABS: 4.0000 mg | ORAL_TABLET | Freq: Three times a day (TID) | ORAL | 0 refills | Status: DC | PRN

## 2020-03-09 MED ORDER — KETAMINE HCL 10 MG/ML IJ SOLN
INTRAMUSCULAR | Status: DC | PRN
Start: 2020-03-09 — End: 2020-03-09
  Administered 2020-03-09 (×3): 10 mg via INTRAVENOUS

## 2020-03-09 MED ORDER — HYDROCODONE-ACETAMINOPHEN 7.5-325 MG PO TABS: 1.0000 | ORAL_TABLET | Freq: Once | ORAL | Status: DC | PRN

## 2020-03-09 MED ORDER — POLYETHYLENE GLYCOL 3350 17 G PO PACK: 17.0000 g | PACK | Freq: Every day | ORAL | Status: DC | PRN

## 2020-03-09 MED ORDER — ACETAMINOPHEN 10 MG/ML IV SOLN: 1000.0000 mg | Freq: Once | INTRAVENOUS | Status: DC | PRN

## 2020-03-09 MED ORDER — DEXAMETHASONE SODIUM PHOSPHATE 10 MG/ML IJ SOLN
INTRAMUSCULAR | Status: AC
  Filled 2020-03-09: qty 1

## 2020-03-09 MED ORDER — BUPIVACAINE-EPINEPHRINE 0.25% -1:200000 IJ SOLN
INTRAMUSCULAR | Status: DC | PRN
  Administered 2020-03-09: 10 mL

## 2020-03-09 MED ORDER — PHENYLEPHRINE 40 MCG/ML (10ML) SYRINGE FOR IV PUSH (FOR BLOOD PRESSURE SUPPORT)
PREFILLED_SYRINGE | INTRAVENOUS | Status: AC
  Filled 2020-03-09: qty 20

## 2020-03-09 MED ORDER — ROCURONIUM BROMIDE 10 MG/ML (PF) SYRINGE
PREFILLED_SYRINGE | INTRAVENOUS | Status: AC
  Filled 2020-03-09: qty 10

## 2020-03-09 MED ORDER — MENTHOL 3 MG MT LOZG: 1.0000 | LOZENGE | OROMUCOSAL | Status: DC | PRN

## 2020-03-09 MED ORDER — GABAPENTIN 300 MG PO CAPS: 300.0000 mg | ORAL_CAPSULE | Freq: Every day | ORAL | Status: DC

## 2020-03-09 MED ORDER — HYDROMORPHONE HCL 1 MG/ML IJ SOLN: 0.2500 mg | INTRAMUSCULAR | Status: DC | PRN

## 2020-03-09 MED ORDER — KETAMINE HCL 50 MG/5ML IJ SOSY
PREFILLED_SYRINGE | INTRAMUSCULAR | Status: AC
  Filled 2020-03-09: qty 5

## 2020-03-09 MED ORDER — FENTANYL CITRATE (PF) 250 MCG/5ML IJ SOLN
INTRAMUSCULAR | Status: AC
  Filled 2020-03-09: qty 5

## 2020-03-09 MED ORDER — SUGAMMADEX SODIUM 200 MG/2ML IV SOLN
INTRAVENOUS | Status: DC | PRN
  Administered 2020-03-09: 175 mg via INTRAVENOUS

## 2020-03-09 MED ORDER — PHENOL 1.4 % MT LIQD: 1.0000 | OROMUCOSAL | Status: DC | PRN

## 2020-03-09 MED ORDER — SODIUM CHLORIDE 0.9 % IV SOLN: 250.0000 mL | INTRAVENOUS | Status: DC

## 2020-03-09 MED ORDER — LIDOCAINE 2% (20 MG/ML) 5 ML SYRINGE
INTRAMUSCULAR | Status: DC | PRN
  Administered 2020-03-09: 100 mg via INTRAVENOUS

## 2020-03-09 MED ORDER — PANTOPRAZOLE SODIUM 20 MG PO TBEC
20.0000 mg | DELAYED_RELEASE_TABLET | Freq: Every day | ORAL | Status: DC
  Administered 2020-03-09: 20 mg via ORAL
  Filled 2020-03-09: qty 1

## 2020-03-09 SURGICAL SUPPLY — 59 items
BNDG GAUZE ELAST 4 BULKY (GAUZE/BANDAGES/DRESSINGS) ×2 IMPLANT
BUR EGG ELITE 4.0 (BURR) IMPLANT
BUR MATCHSTICK NEURO 3.0 LAGG (BURR) IMPLANT
CANISTER SUCT 3000ML PPV (MISCELLANEOUS) ×2 IMPLANT
CLSR STERI-STRIP ANTIMIC 1/2X4 (GAUZE/BANDAGES/DRESSINGS) ×2 IMPLANT
CORD BIPOLAR FORCEPS 12FT (ELECTRODE) ×2 IMPLANT
COVER SURGICAL LIGHT HANDLE (MISCELLANEOUS) ×2 IMPLANT
COVER WAND RF STERILE (DRAPES) ×2 IMPLANT
DRAIN CHANNEL 15F RND FF W/TCR (WOUND CARE) IMPLANT
DRAPE POUCH INSTRU U-SHP 10X18 (DRAPES) ×2 IMPLANT
DRAPE SURG 17X23 STRL (DRAPES) ×2 IMPLANT
DRAPE U-SHAPE 47X51 STRL (DRAPES) ×2 IMPLANT
DRSG OPSITE POSTOP 3X4 (GAUZE/BANDAGES/DRESSINGS) ×2 IMPLANT
DRSG OPSITE POSTOP 4X6 (GAUZE/BANDAGES/DRESSINGS) ×1 IMPLANT
DURAPREP 26ML APPLICATOR (WOUND CARE) ×2 IMPLANT
ELECT BLADE 4.0 EZ CLEAN MEGAD (MISCELLANEOUS)
ELECT CAUTERY BLADE 6.4 (BLADE) ×2 IMPLANT
ELECT PENCIL ROCKER SW 15FT (MISCELLANEOUS) ×2 IMPLANT
ELECT REM PT RETURN 9FT ADLT (ELECTROSURGICAL) ×2
ELECTRODE BLDE 4.0 EZ CLN MEGD (MISCELLANEOUS) IMPLANT
ELECTRODE REM PT RTRN 9FT ADLT (ELECTROSURGICAL) ×1 IMPLANT
EVACUATOR SILICONE 100CC (DRAIN) IMPLANT
GLOVE BIO SURGEON STRL SZ 6.5 (GLOVE) ×2 IMPLANT
GLOVE BIOGEL PI IND STRL 6.5 (GLOVE) ×1 IMPLANT
GLOVE BIOGEL PI IND STRL 8.5 (GLOVE) ×1 IMPLANT
GLOVE BIOGEL PI INDICATOR 6.5 (GLOVE) ×1
GLOVE BIOGEL PI INDICATOR 8.5 (GLOVE) ×1
GLOVE SS BIOGEL STRL SZ 8.5 (GLOVE) ×1 IMPLANT
GLOVE SUPERSENSE BIOGEL SZ 8.5 (GLOVE) ×1
GOWN STRL REUS W/ TWL LRG LVL3 (GOWN DISPOSABLE) ×2 IMPLANT
GOWN STRL REUS W/TWL 2XL LVL3 (GOWN DISPOSABLE) ×2 IMPLANT
GOWN STRL REUS W/TWL LRG LVL3 (GOWN DISPOSABLE) ×4
KIT BASIN OR (CUSTOM PROCEDURE TRAY) ×2 IMPLANT
KIT TURNOVER KIT B (KITS) ×2 IMPLANT
NDL SPNL 18GX3.5 QUINCKE PK (NEEDLE) ×2 IMPLANT
NEEDLE 22X1 1/2 (OR ONLY) (NEEDLE) ×2 IMPLANT
NEEDLE SPNL 18GX3.5 QUINCKE PK (NEEDLE) ×4 IMPLANT
NS IRRIG 1000ML POUR BTL (IV SOLUTION) ×2 IMPLANT
PACK LAMINECTOMY ORTHO (CUSTOM PROCEDURE TRAY) ×2 IMPLANT
PACK UNIVERSAL I (CUSTOM PROCEDURE TRAY) ×2 IMPLANT
PAD ARMBOARD 7.5X6 YLW CONV (MISCELLANEOUS) ×4 IMPLANT
PATTIES SURGICAL .5 X.5 (GAUZE/BANDAGES/DRESSINGS) ×3 IMPLANT
PATTIES SURGICAL .5 X1 (DISPOSABLE) ×2 IMPLANT
SPONGE SURGIFOAM ABS GEL 100 (HEMOSTASIS) IMPLANT
STRIP CLOSURE SKIN 1/2X4 (GAUZE/BANDAGES/DRESSINGS) ×1 IMPLANT
SURGIFLO W/THROMBIN 8M KIT (HEMOSTASIS) ×2 IMPLANT
SUT BONE WAX W31G (SUTURE) ×2 IMPLANT
SUT MNCRL AB 3-0 PS2 27 (SUTURE) ×2 IMPLANT
SUT VIC AB 0 CT1 27 (SUTURE)
SUT VIC AB 0 CT1 27XBRD ANBCTR (SUTURE) IMPLANT
SUT VIC AB 1 CT1 18XCR BRD 8 (SUTURE) ×1 IMPLANT
SUT VIC AB 1 CT1 8-18 (SUTURE) ×2
SUT VIC AB 2-0 CT1 18 (SUTURE) ×2 IMPLANT
SYR BULB IRRIG 60ML STRL (SYRINGE) ×2 IMPLANT
SYR CONTROL 10ML LL (SYRINGE) ×2 IMPLANT
TOWEL GREEN STERILE (TOWEL DISPOSABLE) ×2 IMPLANT
TOWEL GREEN STERILE FF (TOWEL DISPOSABLE) ×2 IMPLANT
WATER STERILE IRR 1000ML POUR (IV SOLUTION) ×2 IMPLANT
YANKAUER SUCT BULB TIP NO VENT (SUCTIONS) IMPLANT

## 2020-03-09 NOTE — Anesthesia Postprocedure Evaluation (Signed)
Anesthesia Post Note  Patient: Diane [name]  Procedure(s) Performed: Left L5-S1 disectomy (Left Spine Lumbar)     Patient location during evaluation: PACU Anesthesia Type: General Level of consciousness: awake and alert Pain management: pain level controlled Vital Signs Assessment: post-procedure vital signs reviewed and stable Respiratory status: spontaneous breathing, nonlabored ventilation, respiratory function stable and patient connected to nasal cannula oxygen Cardiovascular status: blood pressure returned to baseline and stable Postop Assessment: no apparent nausea or vomiting Anesthetic complications: no    Last Vitals:  Vitals:   03/09/20 1515 03/09/20 1533  BP:  109/73  Pulse: 92 93  Resp: 20 20  Temp: 36.5 C 36.7 C  SpO2: 99% 100%    Last Pain:  Vitals:   03/09/20 1533  TempSrc: Oral  PainSc:                  Barnet Glasgow

## 2020-03-09 NOTE — Op Note (Signed)
Operative report  Preoperative diagnosis: Left L5-S1 disc herniation with S1 radiculopathy  Postoperative diagnosis: Same  Operative procedure: Left L5-S1 discectomy  CPT code [id number]  Complications: None  Indications: Diane [name] is a very pleasant 64 year old female with longstanding low back pain who presented with acute onset of increasing left radicular leg pain.  Imaging studies confirmed an L5-S1 disc herniation with S1 nerve compression.  Attempts at conservative management had failed to alleviate her pain or improve her quality of life.  As result we elected to move forward with surgery.  Operative report  Patient was brought the operating room placed upon the operating table.  After successful induction of general anesthesia and endotracheal intubation teds SCDs were applied and she was turned prone onto the Wilson frame.  All bony prominences were well-padded.  Timeout was taken to confirm patient procedure and all other important data.  The back was then prepped and draped in a standard fashion.  2 needles were placed in the back for localization of the skin incision.  X-ray was taken and then I marked out the incision site.  This was then infiltrated with quarter percent Marcaine with epinephrine.  Midline incision was made and sharp dissection was carried out down to the deep fascia.  Deep fascia was sharply incised and I dissected using Bovie and Cobb to expose the L5 lamina.  Penfield 4 was placed underneath the lamina and initially we were at the L4 level.  I then readjusted and repositioned and expose the L5 lamina and took a second x-ray to confirm I was at the appropriate level.  Once confirmed the lamina was marked with a Cobb elevator and a self-retaining Taylor retractor was placed.  A laminotomy of L5 was then performed with a 3 mm Kerrison rongeur and then I gently dissected through the ligamentum flavum with my Penfield 4.  Once I developed a plane between the thecal sac and the  ligamentum flavum I removed this with the Kerrison rongeurs.  The S1 disc was noted to be under compression and dorsally displaced.  A medial facetectomy was performed with a 2 mm Kerrison rongeur in order to gain access to the posterior lateral aspect of the annulus.  With the S1 nerve root identified and protected and annulotomy was performed and I swept circumferentially with a nerve hook to deliver multiple small fragments of disc material.  Epidural veins were identified and coagulated with bipolar electrocautery.  At this point once I had removed all the disc fragments the S1 nerve root was noted to be no longer under compression.  It was freely mobile.  I then continued and used my Kerrison rongeur to perform a foraminotomy of S1 and to confirm the complete lateral recess decompression of S1.  At this point I could freely pass my [facility] elevator superiorly inferiorly and circumferentially along the level of the annulus to confirm adequate decompression/removal of all disc fragments S1 nerve root freely mobile and no longer under compression.  Hemostasis was obtained using bipolar electrocautery as well as FloSeal.  After final irrigation I did place 20 mg (1/2 cc) of Depo-Medrol over the nerve root for postoperative analgesia and then I placed a thrombin-soaked Gelfoam patty over the laminotomy site.  I then remove the retractors and closed the wound in a layered fashion with interrupted #1 Vicryl suture, running 0 Vicryl stitch, 2-0 Vicryl interrupted stitches and a 3-0 Monocryl for the skin.  Steri-Strips and a dry dressing were applied and the patient was ultimately  extubated transfer the PACU without incident.  The end of the case all needle sponge counts were correct there were no adverse intraoperative events.

## 2020-03-09 NOTE — Brief Op Note (Signed)
03/09/2020  1:28 PM  PATIENT:  Laurin Coder  64 y.o. female  PRE-OPERATIVE DIAGNOSIS:  Left L5-S1 herniated disc with radiculopathy  POST-OPERATIVE DIAGNOSIS:  Left L5-S1 herniated disc with radiculopathy  PROCEDURE:  Procedure(s) with comments: Left L5-S1 disectomy (Left) - 2 hrs  SURGEON:  Surgeon(s) and Role:    Melina Schools, MD - Primary  PHYSICIAN ASSISTANT:   ASSISTANTS: none   ANESTHESIA:   general  EBL:  25 mL   BLOOD ADMINISTERED:none  DRAINS: none   LOCAL MEDICATIONS USED:  MARCAINE    and OTHER depomedrol  SPECIMEN:  No Specimen  DISPOSITION OF SPECIMEN:  N/A  COUNTS:  YES  TOURNIQUET:  * No tourniquets in log *  DICTATION: .Dragon Dictation  PLAN OF CARE: Admit for overnight observation  PATIENT DISPOSITION:  PACU - hemodynamically stable.

## 2020-03-09 NOTE — Discharge Instructions (Signed)
  Surgical Spinal Decompression, Care After Refer to this sheet in the next few weeks. These instructions provide you with information about caring for yourself after your procedure. Your health care provider may also give you more specific instructions. Your treatment has been planned according to current medical practices, but problems sometimes occur. Call your health care provider if you have any problems or questions after your procedure. What can I expect after the procedure? It is common to have pain for the first few days after the procedure. Some people continue to have mild pain even after making a full recovery. Follow these instructions at home: Medicine Take medicines only as directed by your health care provider. Avoid taking over-the-counter pain medicines unless your health care provider tells you otherwise. These medicines interfere with the development and growth of new bone cells. If you were prescribed a narcotic pain medicine, take it exactly as told by your health care provider. Do not drink alcohol while on the medicine. Do not drive while on the medicine. Injury care Care for your back brace as told by your health care provider. Brace should be worn at all times for the first 6 weeks except when sleeping, bathing, and sitting leisurely around the home. You CANNOT drive while wearing the brace, however, you may wear it as a passenger.  If directed, apply ice to the injured area: Put ice in a plastic bag. Place a towel between your skin and the bag. Leave the ice on for 20 minutes, 2-3 times a day. Activity Perform physical therapy exercises as told by your health care provider. Exercise regularly. Start by taking short walks. Slowly increase your activity level over time. Gentle exercise helps to ease pain. Sit, stand, walk, turn in bed, and reposition yourself as told by your health care provider. This will help to keep your spine in proper alignment. Avoid bending and  twisting your body. Avoid doing strenuous household chores, such as vacuuming. Do not lift anything that is heavier than 10 lb (4.5 kg). Other Instructions Keep all follow-up visits as directed by your health care provider. This is important. Do not use any tobacco products, including cigarettes, chewing tobacco, or electronic cigarettes. If you need help quitting, ask your health care provider. Nicotine affects the way bones heal. Contact a health care provider if: Your pain gets worse. You have a fever. You have redness, swelling, or pain at the site of your incision. You have fluid, blood, or pus coming from your incision. You have numbness, tingling, or weakness in any part of your body. Get help right away if: Your incision feels swollen and tender, and the surrounding area looks like a lump. The lump may be red or bluish in color. You cannot move any part of your body (paralysis). You cannot control your bladder or bowels. This information is not intended to replace advice given to you by your health care provider. Make sure you discuss any questions you have with your health care provider. 

## 2020-03-09 NOTE — H&P (Signed)
Addendum H&P  Patient continues to have significant radicular left leg pain secondary to a disc herniation at L5-S1 on the left side.  Despite appropriate conservative management her quality of life is continued to deteriorate.  I have gone over the surgical procedure as well as the risks and benefits.  All of her questions were encouraged and addressed.  There is been no change in her clinical exam since her last office visit of 02/29/20.  Plan on moving forward with a left L5-S1 discectomy.

## 2020-03-09 NOTE — Transfer of Care (Signed)
Immediate Anesthesia Transfer of Care Note  Patient: Diane [name]  Procedure(s) Performed: Left L5-S1 disectomy (Left Spine Lumbar)  Patient Location: PACU  Anesthesia Type:General  Level of Consciousness: sedated and patient cooperative  Airway & Oxygen Therapy: Patient Spontanous Breathing and Patient connected to face mask oxygen  Post-op Assessment: Report given to RN and Post -op Vital signs reviewed and stable  Post vital signs: Reviewed  Last Vitals:  Vitals Value Taken Time  BP 122/80 03/09/20 1354  Temp    Pulse 97 03/09/20 1356  Resp 9 03/09/20 1356  SpO2 100 % 03/09/20 1356  Vitals shown include unvalidated device data.  Last Pain:  Vitals:   03/09/20 0919  TempSrc:   PainSc: 0-No pain      Patients Stated Pain Goal: 3 ([id number])  Complications: No apparent anesthesia complications

## 2020-03-09 NOTE — Anesthesia Preprocedure Evaluation (Signed)
Anesthesia Evaluation  Patient identified by MRN, date of birth, ID band Patient awake    Reviewed: Allergy & Precautions, NPO status , Patient's Chart, lab work & pertinent test results  Airway Mallampati: II  TM Distance: >3 FB Neck ROM: Full    Dental no notable dental hx. (+) Lower Dentures, Partial Upper   Pulmonary neg pulmonary ROS, former smoker,    Pulmonary exam normal breath sounds clear to auscultation       Cardiovascular hypertension, Pt. on medications Normal cardiovascular exam Rhythm:Regular Rate:Normal     Neuro/Psych  Headaches, Anxiety Depression    GI/Hepatic Neg liver ROS, GERD  Medicated and Controlled,  Endo/Other  negative endocrine ROS  Renal/GU K+ 3.4     Musculoskeletal  (+) Arthritis , Fibromyalgia -  Abdominal   Peds  Hematology Hgb 13.4   Anesthesia Other Findings   Reproductive/Obstetrics                             Anesthesia Physical Anesthesia Plan  ASA: II  Anesthesia Plan: General   Post-op Pain Management:    Induction: Intravenous  PONV Risk Score and Plan: Ondansetron, Dexamethasone, Treatment may vary due to age or medical condition and Midazolam  Airway Management Planned: Oral ETT  Additional Equipment:   Intra-op Plan:   Post-operative Plan: Extubation in OR  Informed Consent: I have reviewed the patients History and Physical, chart, labs and discussed the procedure including the risks, benefits and alternatives for the proposed anesthesia with the patient or authorized representative who has indicated his/her understanding and acceptance.     Dental advisory given  Plan Discussed with:   Anesthesia Plan Comments: (GA + ketamine 0.3 mcg/kg)        Anesthesia Quick Evaluation

## 2020-03-09 NOTE — Anesthesia Procedure Notes (Signed)
Procedure Name: Intubation Date/Time: 03/09/2020 11:12 AM Performed by: Jenne Campus, CRNA Pre-anesthesia Checklist: Patient identified, Emergency Drugs available, Suction available and Patient being monitored Patient Re-evaluated:Patient Re-evaluated prior to induction Oxygen Delivery Method: Circle System Utilized Preoxygenation: Pre-oxygenation with 100% oxygen Induction Type: IV induction Ventilation: Mask ventilation without difficulty Laryngoscope Size: Miller and 2 Grade View: Grade I Tube type: Oral Tube size: 7.0 mm Number of attempts: 1 Airway Equipment and Method: Stylet Placement Confirmation: ETT inserted through vocal cords under direct vision,  positive ETCO2 and breath sounds checked- equal and bilateral Secured at: 21 cm Tube secured with: Tape Dental Injury: Teeth and Oropharynx as per pre-operative assessment

## 2020-03-09 NOTE — OR Nursing (Signed)
Dr. Ellin Goodie confirmed L5-S1 Level per phone with Dr. Rolena Infante at 1215.

## 2020-03-10 DIAGNOSIS — M5117 Intervertebral disc disorders with radiculopathy, lumbosacral region: Secondary | ICD-10-CM | POA: Diagnosis not present

## 2020-03-10 NOTE — Progress Notes (Signed)
Patient is discharged from room 3C05 at this time. Alert and in stable condition. IV site d/c'd and instructions read to patient and daughter with understanding verbalized and all questions answered. Left unit via wheelchair with all belongings at side. 

## 2020-03-10 NOTE — Progress Notes (Signed)
    Subjective: Procedure(s) (LRB): Left L5-S1 disectomy (Left) 1 Day Post-Op  Patient reports pain as 1 on 0-10 scale.  Reports decreased leg pain reports incisional back pain   Positive void Negative bowel movement Positive flatus Negative chest pain or shortness of breath  Objective: Vital signs in last 24 hours: Temp:  [97.5 F (36.4 C)-98.6 F (37 C)] 98.6 F (37 C) (06/04 0458) Pulse Rate:  [86-123] 98 (06/04 0458) Resp:  [8-20] 18 (06/04 0458) BP: (95-131)/(62-107) 122/69 (06/04 0458) SpO2:  [92 %-100 %] 94 % (06/04 0458) Weight:  [78 kg] 78 kg (06/03 0912)  Intake/Output from previous day: 06/03 0701 - 06/04 0700 In: 1150 [I.V.:1000; IV Piggyback:150] Out: 25 [Blood:25]  Labs: No results for input(s): WBC, RBC, HCT, PLT in the last 72 hours. No results for input(s): NA, K, CL, CO2, BUN, CREATININE, GLUCOSE, CALCIUM in the last 72 hours. No results for input(s): LABPT, INR in the last 72 hours.  Physical Exam: Neurologically intact ABD soft Intact pulses distally Incision: dressing C/D/I and no drainage Compartment soft Body mass index is 29.52 kg/m.   Assessment/Plan: Patient stable  xrays n/a Continue mobilization with physical therapy Continue care  Advance diet Up with therapy  Patient doing well - no complaints of SOB overnight Patient states she has noted issues with SOB for several weeks - she did not disclose this to PCP or me.   Plan on d/c to home - she will discuss issue with Dr Brigitte Pulse (her PCP) in the near future.  Melina Schools, MD [facility] [phone]

## 2020-03-13 DIAGNOSIS — M25512 Pain in left shoulder: Secondary | ICD-10-CM | POA: Diagnosis not present

## 2020-03-13 DIAGNOSIS — I10 Essential (primary) hypertension: Secondary | ICD-10-CM | POA: Diagnosis not present

## 2020-03-13 DIAGNOSIS — D4412 Neoplasm of uncertain behavior of left adrenal gland: Secondary | ICD-10-CM | POA: Diagnosis not present

## 2020-03-13 DIAGNOSIS — M25571 Pain in right ankle and joints of right foot: Secondary | ICD-10-CM | POA: Diagnosis not present

## 2020-03-14 NOTE — Discharge Summary (Signed)
Patient ID: Diane [name] MRN: [id number] DOB/AGE: 1956/02/04 64 y.o.  Admit date: 03/09/2020 Discharge date: 03/14/2020  Admission Diagnoses:  Active Problems:   Lumbar disc herniation   Discharge Diagnoses:  Active Problems:   Lumbar disc herniation  status post Procedure(s): Left L5-S1 disectomy  Past Medical History:  Diagnosis Date   Anxiety    Arthritis    Carpal tunnel syndrome on both sides    Complication of anesthesia    "can't stand mask over face." " It makes my head hurt"   Depression    Fibromyalgia    GERD (gastroesophageal reflux disease)    Headache(784.0)    Migraines   Hypertension    Multiple sites of bowel obstruction ([place]) 2005   Uterine fibroid     Surgeries: Procedure(s): Left L5-S1 disectomy on 03/09/2020   Consultants:   Discharged Condition: Improved  Hospital Course: Diane [name] is an 64 y.o. female who was admitted 03/09/2020 for operative treatment of Left L5-S1 discectomy. Patient failed conservative treatments (please see the history and physical for the specifics) and had severe unremitting pain that affects sleep, daily activities and work/hobbies. After pre-op clearance, the patient was taken to the operating room on 03/09/2020 and underwent  Procedure(s): Left L5-S1 disectomy.    Patient was given perioperative antibiotics:  Anti-infectives (From admission, onward)   Start     Dose/Rate Route Frequency Ordered Stop   03/09/20 1900  ceFAZolin (ANCEF) IVPB 1 g/50 mL premix     1 g 100 mL/hr over 30 Minutes Intravenous Every 8 hours 03/09/20 1532 03/10/20 0330   03/09/20 0915  ceFAZolin (ANCEF) IVPB 2g/100 mL premix     2 g 200 mL/hr over 30 Minutes Intravenous To [facility]Stay [facility] 03/09/20 0858 03/09/20 1130   03/09/20 0859  ceFAZolin (ANCEF) 2-4 GM/100ML-% IVPB    Note to Pharmacy: Tamsen Snider   : cabinet override      03/09/20 0859 03/09/20 1133       Patient was given sequential compression devices  and early ambulation to prevent DVT.   Patient benefited maximally from hospital stay and there were no complications. At the time of discharge, the patient was urinating/moving their bowels without difficulty, tolerating a regular diet, pain is controlled with oral pain medications and they have been cleared by PT/OT.   Recent vital signs: No data found.   Recent laboratory studies: No results for input(s): WBC, HGB, HCT, PLT, NA, K, CL, CO2, BUN, CREATININE, GLUCOSE, INR, CALCIUM in the last 72 hours.  Invalid input(s): PT, 2   Discharge Medications:   Allergies as of 03/10/2020      Reactions   Topamax [topiramate] Hives   Dilaudid [hydromorphone Hcl] Hives      Medication List    STOP taking these medications   cyclobenzaprine 10 MG tablet Commonly known as: FLEXERIL   HYDROcodone-acetaminophen 10-325 MG tablet Commonly known as: NORCO   omeprazole 20 MG capsule Commonly known as: PRILOSEC   promethazine 25 MG tablet Commonly known as: PHENERGAN   verapamil 180 MG CR tablet Commonly known as: CALAN-SR     TAKE these medications   amitriptyline 25 MG tablet Commonly known as: ELAVIL Take 25 mg by mouth at bedtime. What changed: Another medication with the same name was removed. Continue taking this medication, and follow the directions you see here.   amLODipine-olmesartan 5-20 MG tablet Commonly known as: AZOR Take 1 tablet by mouth daily.   DULoxetine 60 MG capsule Commonly known as:  CYMBALTA Take 1 capsule (60 mg total) by mouth daily.   Emgality 120 MG/ML Soaj Generic drug: Galcanezumab-gnlm Inject 120 mg into the skin every 30 (thirty) days.   gabapentin 300 MG capsule Commonly known as: NEURONTIN TAKE 2 CAPSULES (600 MG TOTAL) BY MOUTH 3 (THREE) TIMES DAILY. What changed:   how much to take  when to take this  additional instructions   methocarbamol 500 MG tablet Commonly known as: Robaxin Take 1 tablet (500 mg total) by mouth every 8 (eight)  hours as needed for up to 5 days for muscle spasms.   ondansetron 4 MG tablet Commonly known as: Zofran Take 1 tablet (4 mg total) by mouth every 8 (eight) hours as needed for nausea or vomiting.   oxyCODONE-acetaminophen 10-325 MG tablet Commonly known as: Percocet Take 1 tablet by mouth every 6 (six) hours as needed for up to 5 days for pain.   pantoprazole 20 MG tablet Commonly known as: PROTONIX Take 20 mg by mouth daily before supper.   SUMAtriptan 100 MG tablet Commonly known as: IMITREX Take 100 mg by mouth every 2 (two) hours as needed for migraine. May repeat in 2 hours if headache persists or recurs.   Vitamin D (Ergocalciferol) 1.25 MG (50000 UNIT) Caps capsule Commonly known as: DRISDOL Take 1 capsule (50,000 Units total) by mouth every 7 (seven) days.       Diagnostic Studies: DG Chest 2 View  Result Date: 03/02/2020 CLINICAL DATA:  Low back pain EXAM: CHEST - 2 VIEW COMPARISON:  08/07/2016 FINDINGS: The heart size and mediastinal contours are within normal limits. Both lungs are clear. The visualized skeletal structures are unremarkable. IMPRESSION: No active cardiopulmonary disease. Electronically Signed   By: Donavan Foil M.D.   On: 03/02/2020 22:37   DG Lumbar Spine 1 View  Result Date: 03/09/2020 CLINICAL DATA:  Intraoperative localization EXAM: LUMBAR SPINE - 1 VIEW COMPARISON:  11/02/2019 FINDINGS: Lateral radiograph of the lumbar spine reveals needles within the posterior soft tissues at the L3-4 level and posterior to the L5 vertebral body. Disc space narrowing is noted at L4-5 with osteophytic change. IMPRESSION: Intraoperative lumbar localization as described. Electronically Signed   By: Inez Catalina M.D.   On: 03/09/2020 19:15   DG Spine Portable 1 View  Result Date: 03/09/2020 CLINICAL DATA:  Intraoperative localization for spine surgery. EXAM: PORTABLE SPINE - 1 VIEW COMPARISON:  Prior lumbar spine MRI 11/18/2016 and prior lumbar spine plain films  01/04/2017 and 11/02/2019. FINDINGS: Based on the radiographs patient has transitional lumbar anatomy with lumbarization of S1 and a disc space at S1-2. However, on all the prior studies the last full intervertebral disc space is labeled L5-S1 and the patient has chronic degenerative changes at L4-5. I do not have the recent MRI examination but apparently the patient has a disc herniation at L5-S1, below the chronically degenerated level (L4-5.) IMPRESSION: Surgical instrument marking the last full intervertebral disc space which has been consistently labeled L5-S1. Electronically Signed   By: Marijo Sanes M.D.   On: 03/09/2020 12:18    Discharge Instructions    Incentive spirometry RT   Complete by: As directed       Follow-up Information    Melina Schools, MD. Schedule an appointment as soon as possible for a visit in 2 weeks.   Specialty: Orthopedic Surgery Why: If symptoms worsen, For suture removal, For wound re-check Contact information: [address]  [place] [address] [phone]  Discharge Plan:  discharge to home  Disposition: stable    Signed: Yvonne Kendall Amore Ackman for [facility] PA-C [facility] [phone] 03/14/2020, 9:22 AM

## 2020-04-06 DIAGNOSIS — G5602 Carpal tunnel syndrome, left upper limb: Secondary | ICD-10-CM | POA: Diagnosis not present

## 2020-04-06 DIAGNOSIS — G5603 Carpal tunnel syndrome, bilateral upper limbs: Secondary | ICD-10-CM | POA: Diagnosis not present

## 2020-04-12 ENCOUNTER — Other Ambulatory Visit: Payer: Self-pay

## 2020-04-12 ENCOUNTER — Other Ambulatory Visit: Payer: Self-pay | Admitting: Family Medicine

## 2020-04-12 ENCOUNTER — Ambulatory Visit
Admission: RE | Admit: 2020-04-12 | Discharge: 2020-04-12 | Disposition: A | Discharge status: Home / Self Care | Payer: BC Managed Care – PPO | Source: Ambulatory Visit | Attending: Family Medicine | Admitting: Family Medicine

## 2020-04-12 DIAGNOSIS — R109 Unspecified abdominal pain: Secondary | ICD-10-CM

## 2020-04-12 DIAGNOSIS — R112 Nausea with vomiting, unspecified: Secondary | ICD-10-CM | POA: Diagnosis not present

## 2020-04-12 DIAGNOSIS — M25571 Pain in right ankle and joints of right foot: Secondary | ICD-10-CM | POA: Diagnosis not present

## 2020-04-12 DIAGNOSIS — I10 Essential (primary) hypertension: Secondary | ICD-10-CM | POA: Diagnosis not present

## 2020-04-12 DIAGNOSIS — R1032 Left lower quadrant pain: Secondary | ICD-10-CM | POA: Diagnosis not present

## 2020-04-12 DIAGNOSIS — D4412 Neoplasm of uncertain behavior of left adrenal gland: Secondary | ICD-10-CM | POA: Diagnosis not present

## 2020-04-12 DIAGNOSIS — Z79891 Long term (current) use of opiate analgesic: Secondary | ICD-10-CM | POA: Diagnosis not present

## 2020-04-14 DIAGNOSIS — R12 Heartburn: Secondary | ICD-10-CM | POA: Diagnosis not present

## 2020-04-14 DIAGNOSIS — Z79891 Long term (current) use of opiate analgesic: Secondary | ICD-10-CM | POA: Diagnosis not present

## 2020-04-14 DIAGNOSIS — Z79899 Other long term (current) drug therapy: Secondary | ICD-10-CM | POA: Diagnosis not present

## 2020-04-14 DIAGNOSIS — K56609 Unspecified intestinal obstruction, unspecified as to partial versus complete obstruction: Secondary | ICD-10-CM | POA: Diagnosis not present

## 2020-04-14 DIAGNOSIS — I10 Essential (primary) hypertension: Secondary | ICD-10-CM | POA: Diagnosis not present

## 2020-04-14 DIAGNOSIS — G43911 Migraine, unspecified, intractable, with status migrainosus: Secondary | ICD-10-CM | POA: Diagnosis not present

## 2020-04-14 DIAGNOSIS — D4412 Neoplasm of uncertain behavior of left adrenal gland: Secondary | ICD-10-CM | POA: Diagnosis not present

## 2020-06-17 ENCOUNTER — Emergency Department (HOSPITAL_COMMUNITY)
Admission: EM | Admit: 2020-06-17 | Discharge: 2020-06-18 | Disposition: A | Discharge status: Home / Self Care | Payer: BC Managed Care – PPO | Attending: Emergency Medicine | Admitting: Emergency Medicine

## 2020-06-17 DIAGNOSIS — Z79899 Other long term (current) drug therapy: Secondary | ICD-10-CM | POA: Diagnosis not present

## 2020-06-17 DIAGNOSIS — R0602 Shortness of breath: Secondary | ICD-10-CM | POA: Insufficient documentation

## 2020-06-17 DIAGNOSIS — T402X1A Poisoning by other opioids, accidental (unintentional), initial encounter: Secondary | ICD-10-CM | POA: Insufficient documentation

## 2020-06-17 DIAGNOSIS — I10 Essential (primary) hypertension: Secondary | ICD-10-CM | POA: Diagnosis not present

## 2020-06-17 DIAGNOSIS — Z87891 Personal history of nicotine dependence: Secondary | ICD-10-CM | POA: Diagnosis not present

## 2020-06-17 DIAGNOSIS — T40601A Poisoning by unspecified narcotics, accidental (unintentional), initial encounter: Secondary | ICD-10-CM

## 2020-06-17 MED ORDER — NALOXONE HCL 0.4 MG/ML IJ SOLN
0.4000 mg | Freq: Once | INTRAMUSCULAR | Status: AC
  Administered 2020-06-18: 0.4 mg via INTRAVENOUS
  Filled 2020-06-17: qty 1

## 2020-06-18 ENCOUNTER — Emergency Department (HOSPITAL_COMMUNITY): Payer: BC Managed Care – PPO

## 2020-06-18 ENCOUNTER — Other Ambulatory Visit: Payer: Self-pay

## 2020-06-18 ENCOUNTER — Encounter (HOSPITAL_COMMUNITY): Payer: Self-pay | Admitting: *Deleted

## 2020-06-18 LAB — CBC WITH DIFFERENTIAL/PLATELET
Abs Immature Granulocytes: 0.02 10*3/uL (ref 0.00–0.07)
Basophils Absolute: 0 10*3/uL (ref 0.0–0.1)
Basophils Relative: 1 %
Eosinophils Absolute: 0.1 10*3/uL (ref 0.0–0.5)
Eosinophils Relative: 2 %
HCT: 42.2 % (ref 36.0–46.0)
Hemoglobin: 13.6 g/dL (ref 12.0–15.0)
Immature Granulocytes: 0 %
Lymphocytes Relative: 26 %
Lymphs Abs: 1.3 10*3/uL (ref 0.7–4.0)
MCH: 28.2 pg (ref 26.0–34.0)
MCHC: 32.2 g/dL (ref 30.0–36.0)
MCV: 87.6 fL (ref 80.0–100.0)
Monocytes Absolute: 0.4 10*3/uL (ref 0.1–1.0)
Monocytes Relative: 8 %
Neutro Abs: 3.2 10*3/uL (ref 1.7–7.7)
Neutrophils Relative %: 63 %
Platelets: 373 10*3/uL (ref 150–400)
RBC: 4.82 MIL/uL (ref 3.87–5.11)
RDW: 13 % (ref 11.5–15.5)
WBC: 5 10*3/uL (ref 4.0–10.5)
nRBC: 0 % (ref 0.0–0.2)

## 2020-06-18 LAB — COMPREHENSIVE METABOLIC PANEL
ALT: 19 U/L (ref 0–44)
AST: 33 U/L (ref 15–41)
Albumin: 4.2 g/dL (ref 3.5–5.0)
Alkaline Phosphatase: 83 U/L (ref 38–126)
Anion gap: 14 (ref 5–15)
BUN: 18 mg/dL (ref 8–23)
CO2: 22 mmol/L (ref 22–32)
Calcium: 8.9 mg/dL (ref 8.9–10.3)
Chloride: 103 mmol/L (ref 98–111)
Creatinine, Ser: 1.66 mg/dL — ABNORMAL HIGH (ref 0.44–1.00)
GFR calc Af Amer: 38 mL/min — ABNORMAL LOW (ref >60)
GFR calc non Af Amer: 33 mL/min — ABNORMAL LOW (ref >60)
Glucose, Bld: 115 mg/dL — ABNORMAL HIGH (ref 70–99)
Potassium: 3.1 mmol/L — ABNORMAL LOW (ref 3.5–5.1)
Sodium: 139 mmol/L (ref 135–145)
Total Bilirubin: 0.7 mg/dL (ref 0.3–1.2)
Total Protein: 7.1 g/dL (ref 6.5–8.1)

## 2020-06-18 LAB — I-STAT ARTERIAL BLOOD GAS, ED
Acid-Base Excess: 0 mmol/L (ref 0.0–2.0)
Bicarbonate: 25 mmol/L (ref 20.0–28.0)
Calcium, Ion: 1.21 mmol/L (ref 1.15–1.40)
HCT: 40 % (ref 36.0–46.0)
Hemoglobin: 13.6 g/dL (ref 12.0–15.0)
O2 Saturation: 99 %
Patient temperature: 97.7
Potassium: 3.1 mmol/L — ABNORMAL LOW (ref 3.5–5.1)
Sodium: 140 mmol/L (ref 135–145)
TCO2: 26 mmol/L (ref 22–32)
pCO2 arterial: 41.8 mmHg (ref 32.0–48.0)
pH, Arterial: 7.382 (ref 7.350–7.450)
pO2, Arterial: 127 mmHg — ABNORMAL HIGH (ref 83.0–108.0)

## 2020-06-18 LAB — ETHANOL: Alcohol, Ethyl (B): <10 mg/dL (ref <10)

## 2020-06-18 LAB — BRAIN NATRIURETIC PEPTIDE: B Natriuretic Peptide: 10.8 pg/mL (ref 0.0–100.0)

## 2020-06-18 LAB — ACETAMINOPHEN LEVEL: Acetaminophen (Tylenol), Serum: <10 ug/mL — ABNORMAL LOW (ref 10–30)

## 2020-06-18 LAB — SALICYLATE LEVEL: Salicylate Lvl: <7 mg/dL — ABNORMAL LOW (ref 7.0–30.0)

## 2020-06-18 LAB — D-DIMER, QUANTITATIVE: D-Dimer, Quant: 0.52 ug/mL-FEU — ABNORMAL HIGH (ref 0.00–0.50)

## 2020-06-18 MED ORDER — SODIUM CHLORIDE 0.9 % IV SOLN
1000.0000 mL | INTRAVENOUS | Status: DC
  Administered 2020-06-18: 1000 mL via INTRAVENOUS

## 2020-06-18 MED ORDER — NARCAN 4 MG/0.1ML NA LIQD: 1.0000 | Freq: Once | NASAL | 0 refills | Status: AC

## 2020-06-18 MED ORDER — SODIUM CHLORIDE 0.9 % IV BOLUS (SEPSIS)
500.0000 mL | Freq: Once | INTRAVENOUS | Status: AC
  Administered 2020-06-18: 500 mL via INTRAVENOUS

## 2020-06-18 NOTE — Discharge Instructions (Addendum)
Please cut your dosing of Vicodin/Norco down to half a tablet (5 mg) per dosing.  Take scheduled Tylenol, 1000 mg every 8 hours.  If your pain is not controlled with the scheduled Tylenol and then you may take your half dose of Vicodin.  I will prescribe you Narcan which is a reversal for opiate overdose in case you unintentionally overdose on your pain medicine.

## 2020-06-18 NOTE — ED Triage Notes (Signed)
The pt brought to the ed by [facility] from home  Sob this afternoon getting worse tonight  Pt  Has no history of the same on non-rebreather that ems ahanged to c -pap  Placed on bi-pap on arrival here she actually looks good not sweating  No acute distress l ic per ems

## 2020-06-18 NOTE — ED Provider Notes (Signed)
[facility] EMERGENCY DEPARTMENT Provider Note  CSN: [id number] Arrival date & time: 06/17/20 2346  Chief Complaint(s) Shortness of Breath   ED Triage Notes The pt brought to the ed by [facility] from home  Sob this afternoon getting worse tonight  Pt  Has no history of the same on non-rebreather that ems ahanged to c -pap  Placed on bi-pap on arrival here she actually looks good not sweating  No acute distress l ic per ems     HPI Diane [name] is a 64 y.o. female brought in by EMS for shortness of breath.  Found to be hypoxic with sats in the low 80s on room air by first responders.  Placed on nonrebreather and ultimately required CPAP in route.  Patient reports that she has been having intermittent shortness of breath for several weeks.  Tonight's episode began approximately 1 to 2 hours prior to arrival.  Slow onset and gradually worsened.  No associated chest pain.  No recent fevers or infections.  No coughing or congestion.  No nausea or vomiting.  No abdominal pain.  No known sick contacts.  Patient reports that she is not been feeling well for several weeks.  Denies any alcohol, tobacco, or illicit drug use.  She denies past medical history of COPD or heart failure.  In route patient remained hemodynamically stable on CPAP. Not hypertensive en route.  She received Solu-Medrol, magnesium and a DuoNeb.  HPI  Past Medical History Past Medical History:  Diagnosis Date  . Anxiety   . Arthritis   . Carpal tunnel syndrome on both sides   . Complication of anesthesia    "can't stand mask over face." " It makes my head hurt"  . Depression   . Fibromyalgia   . GERD (gastroesophageal reflux disease)   . Headache(784.0)    Migraines  . Hypertension   . Multiple sites of bowel obstruction ([place]) 2005  . Uterine fibroid    Patient Active Problem List   Diagnosis Date Noted  . Lumbar disc herniation 03/09/2020  . Adrenal nodule ([place]) 09/27/2016  . Fibromyalgia  02/01/2016  . Arthritis, senescent 02/01/2016  . Vitamin D deficiency 02/01/2016  . Depression 08/31/2015  . Leukopenia 08/31/2015  . Polypharmacy 08/31/2015  . Migraine headache without aura 12/31/2012  . Hypertension 12/31/2012  . Insomnia 12/31/2012   Home Medication(s) Prior to Admission medications   Medication Sig Start Date End Date Taking? Authorizing Provider  amitriptyline (ELAVIL) 25 MG tablet Take 25 mg by mouth at bedtime.   Yes [provider]  amLODipine-olmesartan (AZOR) 5-20 MG tablet Take 1 tablet by mouth daily. 01/27/20  Yes [provider]  celecoxib (CELEBREX) 200 MG capsule Take 200 mg by mouth 2 (two) times daily. 06/06/20  Yes [provider]  DULoxetine (CYMBALTA) 60 MG capsule Take 1 capsule (60 mg total) by mouth daily. 12/13/18  Yes Shawnee Knapp, MD  EMGALITY 120 MG/ML SOAJ Inject 120 mg into the skin every 30 (thirty) days. 01/03/20  Yes [provider]  gabapentin (NEURONTIN) 300 MG capsule TAKE 2 CAPSULES (600 MG TOTAL) BY MOUTH 3 (THREE) TIMES DAILY. Patient taking differently: Take 300-600 mg by mouth See admin instructions. Take 300 mg by mouth in the morning and evening and 600 mg at bedtime 12/22/18  Yes Stallings, Zoe A, MD  HYDROcodone-acetaminophen (NORCO) 10-325 MG tablet Take 1 tablet by mouth every 6 (six) hours as needed. 05/29/20  Yes [provider]  omeprazole ([place]) 20  MG capsule Take 20 mg by mouth daily. 05/26/20  Yes [provider]  pantoprazole (PROTONIX) 20 MG tablet Take 20 mg by mouth daily before supper.   Yes [provider]  promethazine (PHENERGAN) 25 MG tablet Take 25 mg by mouth every 8 (eight) hours as needed for nausea or vomiting.  04/13/20  Yes [provider]  SUMAtriptan (IMITREX) 100 MG tablet Take 100 mg by mouth every 2 (two) hours as needed for migraine. May repeat in 2 hours if headache persists or recurs.   Yes [provider]  naloxone (NARCAN)  4 MG/0.1ML LIQD nasal spray kit Place 1 spray into the nose once for 1 dose. 06/18/20 06/18/20  Fatima Blank, MD  ondansetron (ZOFRAN) 4 MG tablet Take 1 tablet (4 mg total) by mouth every 8 (eight) hours as needed for nausea or vomiting. Patient not taking: Reported on 06/18/2020 03/09/20   Melina Schools, MD                                                                                                                                    Past Surgical History Past Surgical History:  Procedure Laterality Date  . CHOLECYSTECTOMY    . COLON RESECTION  1990  . endometrial curretage  2005  . GANGLION CYST EXCISION Right 2001   hand  . HERNIA REPAIR Left    inguinal  . LUMBAR LAMINECTOMY/DECOMPRESSION MICRODISCECTOMY Left 03/09/2020   Procedure: Left L5-S1 disectomy;  Surgeon: Melina Schools, MD;  Location: [facility];  Service: Orthopedics;  Laterality: Left;  2 hrs  . TUBAL LIGATION     Family History Family History  Problem Relation Age of Onset  . Healthy Mother     Social History Social History   Tobacco Use  . Smoking status: Former Smoker    Years: 8.00    Quit date: 10/08/1987    Years since quitting: 32.7  . Smokeless tobacco: Never Used  Vaping Use  . Vaping Use: Never used  Substance Use Topics  . Alcohol use: Yes    Alcohol/week: 0.0 standard drinks    Comment: 1 glass of wine occasionally  . Drug use: No   Allergies Topamax [topiramate] and Dilaudid [hydromorphone hcl]  Review of Systems Review of Systems All other systems are reviewed and are negative for acute change except as noted in the HPI  Physical Exam Vital Signs  I have reviewed the triage vital signs BP 106/70 (BP Location: Right Arm)   Pulse 94   Temp 97.7 F (36.5 C) (Axillary)   Resp 16   Ht 5' 5"  (1.651 m)   Wt 81.6 kg   SpO2 100%   BMI 29.95 kg/m   Physical Exam Vitals reviewed.  Constitutional:      General: She is not in acute distress.    Appearance: She is well-developed. She  is not diaphoretic.  HENT:     Head: Normocephalic  and atraumatic.     Nose: Nose normal.  Eyes:     General: No scleral icterus.       Right eye: No discharge.        Left eye: No discharge.     Conjunctiva/sclera: Conjunctivae normal.     Pupils: Pupils are equal, round, and reactive to light.  Cardiovascular:     Rate and Rhythm: Normal rate and regular rhythm.     Heart sounds: No murmur heard.  No friction rub. No gallop.   Pulmonary:     Effort: Tachypnea and respiratory distress present.     Breath sounds: Normal breath sounds. No stridor. No wheezing, rhonchi or rales.  Abdominal:     General: There is no distension.     Palpations: Abdomen is soft.     Tenderness: There is no abdominal tenderness.  Musculoskeletal:        General: No tenderness.     Cervical back: Normal range of motion and neck supple.  Skin:    General: Skin is warm and dry.     Findings: No erythema or rash.  Neurological:     Mental Status: She is alert and oriented to person, place, and time.     ED Results and Treatments Labs (all labs ordered are listed, but only abnormal results are displayed) Labs Reviewed  COMPREHENSIVE METABOLIC PANEL - Abnormal; Notable for the following components:      Result Value   Potassium 3.1 (*)    Glucose, Bld 115 (*)    Creatinine, Ser 1.66 (*)    GFR calc non Af Amer 33 (*)    GFR calc Af Amer 38 (*)    All other components within normal limits  ACETAMINOPHEN LEVEL - Abnormal; Notable for the following components:   Acetaminophen (Tylenol), Serum <10 (*)    All other components within normal limits  SALICYLATE LEVEL - Abnormal; Notable for the following components:   Salicylate Lvl <7.9 (*)    All other components within normal limits  D-DIMER, QUANTITATIVE (NOT AT [facility]) - Abnormal; Notable for the following components:   D-Dimer, Quant 0.52 (*)    All other components within normal limits  I-STAT ARTERIAL BLOOD GAS, ED - Abnormal; Notable for the  following components:   pO2, Arterial 127 (*)    Potassium 3.1 (*)    All other components within normal limits  CBC WITH DIFFERENTIAL/PLATELET  BRAIN NATRIURETIC PEPTIDE  ETHANOL  BLOOD GAS, ARTERIAL  RAPID URINE DRUG SCREEN, HOSP PERFORMED                                                                                                                         EKG  EKG Interpretation  Date/Time:  Saturday June 17 2020 23:53:39 EDT Ventricular Rate:  96 PR Interval:    QRS Duration: 94 QT Interval:  368 QTC Calculation: 465 R Axis:   -39 Text Interpretation: Sinus rhythm Left axis deviation Abnormal R-wave progression, late  transition No acute changes Confirmed by Addison Lank [phone]) on 06/17/2020 11:56:18 PM      Radiology DG Chest Port 1 View  Result Date: 06/18/2020 CLINICAL DATA:  Shortness of breath EXAM: PORTABLE CHEST 1 VIEW COMPARISON:  03/02/2020 FINDINGS: Heart size and pulmonary vascularity are normal for technique. Shallow inspiration with linear atelectasis in the lung bases. No pleural effusions. No pneumothorax. Mediastinal contours appear intact. IMPRESSION: Shallow inspiration with linear atelectasis in the lung bases. Electronically Signed   By: Lucienne Capers M.D.   On: 06/18/2020 01:05    Pertinent labs & imaging results that were available during my care of the patient were reviewed by me and considered in my medical decision making (see chart for details).  Medications Ordered in ED Medications  sodium chloride 0.9 % bolus 500 mL (0 mLs Intravenous Stopped 06/18/20 0607)    Followed by  0.9 %  sodium chloride infusion (0 mLs Intravenous Stopped 06/18/20 0605)  naloxone [facility]) injection 0.4 mg (0.4 mg Intravenous Given 06/18/20 0044)                                                                                                                                    Procedures .1-3 Lead EKG Interpretation Performed by: Fatima Blank,  MD Authorized by: Fatima Blank, MD     Interpretation: normal     ECG rate:  95   ECG rate assessment: normal     Rhythm: sinus rhythm     Ectopy: none     Conduction: normal   .Critical Care Performed by: Fatima Blank, MD Authorized by: Fatima Blank, MD    CRITICAL CARE Performed by: Grayce Sessions Minha Fulco Total critical care time: 76 minutes Critical care time was exclusive of separately billable procedures and treating other patients. Critical care was necessary to treat or prevent imminent or life-threatening deterioration. Critical care was time spent personally by me on the following activities: development of treatment plan with patient and/or surrogate as well as nursing, discussions with consultants, evaluation of patient's response to treatment, examination of patient, obtaining history from patient or surrogate, ordering and performing treatments and interventions, ordering and review of laboratory studies, ordering and review of radiographic studies, pulse oximetry and re-evaluation of patient's condition.    (including critical care time)  Medical Decision Making / ED Course I have reviewed the nursing notes for this encounter and the patient's prior records (if available in EHR or on provided paperwork).   Diane [name] was evaluated in Emergency Department on 06/18/2020 for the symptoms described in the history of present illness. She was evaluated in the context of the global COVID-19 pandemic, which necessitated consideration that the patient might be at risk for infection with the SARS-CoV-2 virus that causes COVID-19. Institutional protocols and algorithms that pertain to the evaluation of patients at risk for COVID-19 are in a state of rapid change based on  information released by regulatory bodies including the CDC and federal and state organizations. These policies and algorithms were followed during the patient's care in the  ED.  Patient came in with hypoxic respiratory distress.  No clear current etiology.  No prior history of COPD or CHF.  No evidence of volume overload on exam.  Patient was not hypotensive concerning for hypertensive crisis/flash pulmonary edema.  On record review of the narcotic database, I did notice that patient had received prescription for 180 tablets of 10 mg Vicodin/Norco each month since June, with an extra dose of Percocets in June.   We will give patient Narcan and assess for improvement.  We will also obtain screening labs and chest x-ray to attempt to identify patient's source of hypoxia.    Clinical Course as of Jun 18 726  Sun Jun 18, 2020  0034 Transitioned from BiPAP to [facility]. Now more alert and interactive.   [PC]  2025 Took pt off of O2. Sating well. Will continue to monitor.   [PC]  0330 Work up reassuring without evidence of pulmonary edema, pneumothorax, pneumonia.  D-dimer mildly elevated less than the age-adjusted threshold.  Given her significant improvement following Narcan, I have low suspicion for pulmonary embolism.  Patient is not anemic.  No leukocytosis.  She does have mild AKI likely dehydration.  She was given an IV fluid bolus.   [PC]  [id number] Patient monitored for several hours and remained hemodynamically stable with normal sats on room air.  Continues to be easily arousable.  Feel she is appropriate for discharge home.  Will prescribe the patient Narcan and recommend adjustments to her narcotic pain medicine.   [PC]    Clinical Course User Index [PC] Alieyah Spader, Grayce Sessions, MD     Final Clinical Impression(s) / ED Diagnoses Final diagnoses:  SOB (shortness of breath)  Opiate overdose, accidental or unintentional, initial encounter [facility])    The patient appears reasonably screened and/or stabilized for discharge and I doubt any other medical condition or other [facility] requiring further screening, evaluation, or treatment in the ED at this time prior to  discharge. Safe for discharge with strict return precautions.  Disposition: Discharge  Condition: Good  I have discussed the results, Dx and Tx plan with the patient/family who expressed understanding and agree(s) with the plan. Discharge instructions discussed at length. The patient/family was given strict return precautions who verbalized understanding of the instructions. No further questions at time of discharge.    ED Discharge Orders         Ordered    naloxone [facility]) 4 MG/0.1ML LIQD nasal spray kit   Once        06/18/20 0617           Follow Up: Shawnee Knapp, MD [place] [address] [phone]  Call  As needed  Melina Schools, MD [address] [place] [address] [place] [address] [phone]  Call  As needed     This chart was dictated using voice recognition software.  Despite best efforts to proofread,  errors can occur which can change the documentation meaning.   Fatima Blank, MD 06/18/20 [phone]

## 2020-06-18 NOTE — ED Notes (Signed)
Newton Pigg, daughter, [phone] would like an update when available

## 2020-09-21 ENCOUNTER — Institutional Professional Consult (permissible substitution): Payer: BC Managed Care – PPO | Admitting: Neurology

## 2020-09-21 ENCOUNTER — Telehealth: Payer: Self-pay | Admitting: Neurology

## 2020-09-21 NOTE — Telephone Encounter (Signed)
Pt showed up at 1:15 for 1 pm sleep consult. At this time was unable to see the patient today since she is late. Front staff to reschedule.

## 2020-10-26 ENCOUNTER — Other Ambulatory Visit: Payer: Self-pay

## 2020-10-26 ENCOUNTER — Ambulatory Visit: Payer: BC Managed Care – PPO | Admitting: Neurology

## 2020-10-26 ENCOUNTER — Telehealth: Payer: Self-pay | Admitting: Neurology

## 2020-10-26 ENCOUNTER — Encounter: Payer: Self-pay | Admitting: Neurology

## 2020-10-26 VITALS — BP 118/84 | HR 89 | Ht 64.5 in | Wt 181.0 lb

## 2020-10-26 DIAGNOSIS — G4701 Insomnia due to medical condition: Secondary | ICD-10-CM

## 2020-10-26 DIAGNOSIS — G478 Other sleep disorders: Secondary | ICD-10-CM

## 2020-10-26 DIAGNOSIS — F5104 Psychophysiologic insomnia: Secondary | ICD-10-CM | POA: Diagnosis not present

## 2020-10-26 DIAGNOSIS — T3995XA Adverse effect of unspecified nonopioid analgesic, antipyretic and antirheumatic, initial encounter: Secondary | ICD-10-CM | POA: Insufficient documentation

## 2020-10-26 DIAGNOSIS — G8929 Other chronic pain: Secondary | ICD-10-CM | POA: Diagnosis not present

## 2020-10-26 DIAGNOSIS — T3994XS Poisoning by unspecified nonopioid analgesic, antipyretic and antirheumatic, undetermined, sequela: Secondary | ICD-10-CM

## 2020-10-26 DIAGNOSIS — T3991XA Poisoning by unspecified nonopioid analgesic, antipyretic and antirheumatic, accidental (unintentional), initial encounter: Secondary | ICD-10-CM | POA: Insufficient documentation

## 2020-10-26 DIAGNOSIS — G4440 Drug-induced headache, not elsewhere classified, not intractable: Secondary | ICD-10-CM | POA: Diagnosis not present

## 2020-10-26 NOTE — Patient Instructions (Signed)
Chronic Pain, Adult Chronic pain is a type of pain that lasts or keeps coming back for at least 3-6 months. You may have headaches, pain in the abdomen, or pain in other areas of the body. Chronic pain may be related to an illness, such as fibromyalgia or complex regional pain syndrome. Chronic pain may also be related to an injury or a health condition. Sometimes, the cause of chronic pain is not known. Chronic pain can make it hard for you to do daily activities. If not treated, chronic pain can lead to anxiety and depression. Treatment depends on the cause and severity of your pain. You may need to work with a pain specialist to come up with a treatment plan. The plan may include medicine, counseling, and physical therapy. Many people benefit from a combination of two or more types of treatment to control their pain. Follow these instructions at home: Medicines  Take over-the-counter and prescription medicines only as told by your health care provider.  Ask your health care provider if the medicine prescribed to you: ? Requires you to avoid driving or using machinery. ? Can cause constipation. You may need to take these actions to prevent or treat constipation:  Drink enough fluid to keep your urine pale yellow.  Take over-the-counter or prescription medicines.  Eat foods that are high in fiber, such as beans, whole grains, and fresh fruits and vegetables.  Limit foods that are high in fat and processed sugars, such as fried or sweet foods. Treatment plan Follow your treatment plan as told by your health care provider. This may include:  Gentle, regular exercise.  Eating a healthy diet that includes foods such as vegetables, fruits, fish, and lean meats.  Cognitive or behavioral therapy that changes the way you think or act in response to the pain. This may help improve how you feel.  Working with a physical therapist.  Meditation, yoga, acupuncture, or massage therapy.  Aroma,  color, light, or sound therapy.  Local electrical stimulation. The electrical pulses help to relieve pain by temporarily stopping the nerve impulses that cause you to feel pain.  Injections. These deliver numbing or pain-relieving medicines into the spine or the area of pain.   Lifestyle  Ask your health care provider whether you should keep a pain diary. Your health care provider will tell you what information to write in the diary. This may include when you have pain, what the pain feels like, and how medicines and other behaviors or treatments help to reduce the pain.  Consider talking with a mental health care provider about how to manage chronic pain.  Consider joining a chronic pain support group.  Try to control or lower your stress levels. Talk with your health care provider about ways to do this.   General instructions  Learn as much as you can about how to manage your chronic pain. Ask your health care provider if an intensive pain rehabilitation program or a chronic pain specialist would be helpful.  Check your pain level as told by your health care provider. Ask your health care provider if you should use a pain scale.  It is up to you to get the results of any tests that were done. Ask your health care provider, or the department that is doing the tests, when your results will be ready.  Keep all follow-up visits as told by your health care provider. This is important. Contact a health care provider if:  Your pain gets worse, or   you have new pain.  You have trouble sleeping.  You have trouble doing your normal activities.  Your pain is not controlled with treatment.  You have side effects from pain medicine.  You feel weak.  You notice any other changes that show that your condition is getting worse. Get help right away if:  You lose feeling or have numbness in your body.  You lose control of bowel or bladder function.  Your pain suddenly gets much  worse.  You develop shaking or chills.  You develop confusion.  You develop chest pain.  You have trouble breathing or shortness of breath.  You pass out.  You have thoughts about hurting yourself or others. If you ever feel like you may hurt yourself or others, or have thoughts about taking your own life, get help right away. Go to your nearest emergency department or:  Call your local emergency services (911 in the [place]  Call a suicide crisis helpline, such as the [place] at 915-284-7376. This is open 24 hours a day in the [place]  Text the Crisis Text Line at 253-082-1843 (in the [place].). Summary  Chronic pain is a type of pain that lasts or keeps coming back for at least 3-6 months.  Chronic pain may be related to an illness, injury, or other health condition. Sometimes, the cause of chronic pain is not known.  Treatment depends on the cause and severity of your pain.  Many people benefit from a combination of two or more types of treatment to control their pain.  Follow your treatment plan as told by your health care provider. This information is not intended to replace advice given to you by your health care provider. Make sure you discuss any questions you have with your health care provider. Document Revised: 06/10/2019 Document Reviewed: 06/10/2019 Elsevier Patient Education  2021 [employer]. Insomnia Insomnia is a sleep disorder that makes it difficult to fall asleep or stay asleep. Insomnia can cause fatigue, low energy, difficulty concentrating, mood swings, and poor performance at work or school. There are three different ways to classify insomnia:  Difficulty falling asleep.  Difficulty staying asleep.  Waking up too early in the morning. Any type of insomnia can be long-term (chronic) or short-term (acute). Both are common. Short-term insomnia usually lasts for three months or less. Chronic insomnia occurs at least three times a week  for longer than three months. What are the causes? Insomnia may be caused by another condition, situation, or substance, such as:  Anxiety.  Certain medicines.  Gastroesophageal reflux disease (GERD) or other gastrointestinal conditions.  Asthma or other breathing conditions.  Restless legs syndrome, sleep apnea, or other sleep disorders.  Chronic pain.  Menopause.  Stroke.  Abuse of alcohol, tobacco, or illegal drugs.  Mental health conditions, such as depression.  Caffeine.  Neurological disorders, such as Alzheimer's disease.  An overactive thyroid (hyperthyroidism). Sometimes, the cause of insomnia may not be known. What increases the risk? Risk factors for insomnia include:  Gender. Women are affected more often than men.  Age. Insomnia is more common as you get older.  Stress.  Lack of exercise.  Irregular work schedule or working night shifts.  Traveling between different time zones.  Certain medical and mental health conditions. What are the signs or symptoms? If you have insomnia, the main symptom is having trouble falling asleep or having trouble staying asleep. This may lead to other symptoms, such as:  Feeling fatigued or having low  energy.  Feeling nervous about going to sleep.  Not feeling rested in the morning.  Having trouble concentrating.  Feeling irritable, anxious, or depressed. How is this diagnosed? This condition may be diagnosed based on:  Your symptoms and medical history. Your health care provider may ask about: ? Your sleep habits. ? Any medical conditions you have. ? Your mental health.  A physical exam. How is this treated? Treatment for insomnia depends on the cause. Treatment may focus on treating an underlying condition that is causing insomnia. Treatment may also include:  Medicines to help you sleep.  Counseling or therapy.  Lifestyle adjustments to help you sleep better. Follow these instructions at  home: Eating and drinking  Limit or avoid alcohol, caffeinated beverages, and cigarettes, especially close to bedtime. These can disrupt your sleep.  Do not eat a large meal or eat spicy foods right before bedtime. This can lead to digestive discomfort that can make it hard for you to sleep.   Sleep habits  Keep a sleep diary to help you and your health care provider figure out what could be causing your insomnia. Write down: ? When you sleep. ? When you wake up during the night. ? How well you sleep. ? How rested you feel the next day. ? Any side effects of medicines you are taking. ? What you eat and drink.  Make your bedroom a dark, comfortable place where it is easy to fall asleep. ? Put up shades or blackout curtains to block light from outside. ? Use a white noise machine to block noise. ? Keep the temperature cool.  Limit screen use before bedtime. This includes: ? Watching TV. ? Using your smartphone, tablet, or computer.  Stick to a routine that includes going to bed and waking up at the same times every day and night. This can help you fall asleep faster. Consider making a quiet activity, such as reading, part of your nighttime routine.  Try to avoid taking naps during the day so that you sleep better at night.  Get out of bed if you are still awake after 15 minutes of trying to sleep. Keep the lights down, but try reading or doing a quiet activity. When you feel sleepy, go back to bed.   General instructions  Take over-the-counter and prescription medicines only as told by your health care provider.  Exercise regularly, as told by your health care provider. Avoid exercise starting several hours before bedtime.  Use relaxation techniques to manage stress. Ask your health care provider to suggest some techniques that may work well for you. These may include: ? Breathing exercises. ? Routines to release muscle tension. ? Visualizing peaceful scenes.  Make sure that  you drive carefully. Avoid driving if you feel very sleepy.  Keep all follow-up visits as told by your health care provider. This is important. Contact a health care provider if:  You are tired throughout the day.  You have trouble in your daily routine due to sleepiness.  You continue to have sleep problems, or your sleep problems get worse. Get help right away if:  You have serious thoughts about hurting yourself or someone else. If you ever feel like you may hurt yourself or others, or have thoughts about taking your own life, get help right away. You can go to your nearest emergency department or call:  Your local emergency services (911 in the [place]  A suicide crisis helpline, such as the [place] at 308 502 5767.  This is open 24 hours a day. Summary  Insomnia is a sleep disorder that makes it difficult to fall asleep or stay asleep.  Insomnia can be long-term (chronic) or short-term (acute).  Treatment for insomnia depends on the cause. Treatment may focus on treating an underlying condition that is causing insomnia.  Keep a sleep diary to help you and your health care provider figure out what could be causing your insomnia. This information is not intended to replace advice given to you by your health care provider. Make sure you discuss any questions you have with your health care provider. Document Revised: 08/03/2020 Document Reviewed: 08/03/2020 Elsevier Patient Education  2021 [employer].

## 2020-10-26 NOTE — Telephone Encounter (Signed)
I did not accept the patient as a headache patient to our practice while treated on narcotics, having been in polypharmacy and having had an overdose event with narcrotic meds last September. She will need to follow pai management for that.  I will screen her for OSA.   c Dr [name], MD.

## 2020-10-26 NOTE — Progress Notes (Signed)
SLEEP MEDICINE CLINIC    Provider:  Larey Seat, MD  Primary Care Physician:  Shawnee Knapp, MD [place] [address]     Referring Provider: Shawnee Knapp, [place] [address] [place],  [place] [address]          Chief Complaint according to patient   Patient presents with:    . New Patient (Initial Visit)     Patient has very limited recall of medical history and understanding of why she is referred to [place].       HISTORY OF PRESENT ILLNESS:  Diane [name] is a 65 y.o.  African American female patient and is seen on 10/26/2020  Upon referral by Dr [name]. Chief concern :  no concern according to patient. She then admitted to an accidential opiate overdose earlier last year and being told by anesthesiologist that she stopped breathing in the wake up room after Dr. Rolena Infante Low-Back surgery.  She was referred to pain management.    I have the pleasure of seeing Diane [name] today, a right-handed Black or [place] American female with a possible opiate induced central  sleep  Breathing disorder.  She reports shortness of breath, too. States her husband has never reported her to snore. She has not returned to work since January 2021 after a work related back injury while lifting.  She  has a past medical history of Anxiety, Arthritis, Carpal tunnel syndrome on both sides, Complication of anesthesia, Depression, Fibromyalgia, GERD (gastroesophageal reflux disease), Headache(784.0), Hypertension, Multiple sites of bowel obstruction ([place]) (2005), and Uterine fibroid. Dr. Rolena Infante performed a surgery in June, and by September she was seen in ED with narcotic overdose ,  released her from his care last week.   Sleep relevant medical history: had an opiate overdose, chronic user of narcotics, has chronic pain and chronic headaches interrupting sleep frequently. Nocturia 3 times or more . No cervical neck injuries and no sinus problems reported.  Family medical /sleep  history: No other family member on CPAP with OSA.    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 She lives in a household with husband and grand son ( 61). Pets are present, 3 dogs.Tobacco use; none .   ETOH use; rare , high Caffeine intake in form of Coffee(1 cup in AM ) Soda(very day 2-4) Tea ( 2-4) or energy drinks.    Sleep habits are as follows: The patient's dinner time is between 6-7 PM. The patient goes to bed at 9-10 PM, she struggles to sleep- often until 1-2 in AM, cool, but not quiet and dark with the TV running. Both feel the need for TV on all night.     She continues to sleep for intervals of 1-2 hours, wakes for frequent  bathroom breaks and MOSTLY PAIN _  Chronic INSOMNIA since January 2021 due to back pain.     The preferred sleep position is not named, with the support of 3 pillows. Dreams are reportedly rare.  AM is the usual rise time. The patient wakes up spontaneously.  She reports eating only one meal a day, not feeling refreshed or restored in AM, with symptoms such as dry mouth, morning headaches, and residual fatigue.  Naps are taken infrequently, she can't sleep.    Review of Systems: Out of a complete 14 system review, the patient complains of only the following symptoms, and all other reviewed systems are  negative.:  Fatigue, sleepiness , snoring, fragmented sleep, Insomnia was present for years but worse now due to pain.   Photo and phonophobia with migraine. Dr Brigitte Pulse diagnosed "Fibromyalgia" .  Patient denies depression.    How likely are you to doze in the following situations:  0 = not likely, 1 = slight chance, 2 = moderate chance, 3 = high chance   Sitting and Reading? Watching Television? Sitting inactive in a public place (theater or meeting)? As a passenger in a car for an hour without a break? Lying down in the afternoon when circumstances permit? Sitting and talking to someone? Sitting quietly after lunch  without alcohol? In a car, while stopped for a few minutes in traffic?   Total = 5 / 24 points   FSS endorsed at 33/ 63 points.   Social History   Socioeconomic History  . Marital status: Married    Spouse name: [name]  . Number of children: 4  . Years of education: 25  . Highest education level: Not on file  Occupational History    Comment: [employer]  Tobacco Use  . Smoking status: Former Smoker    Years: 8.00    Quit date: 10/08/1987    Years since quitting: 33.0  . Smokeless tobacco: Never Used  Vaping Use  . Vaping Use: Never used  Substance and Sexual Activity  . Alcohol use: Yes    Alcohol/week: 0.0 standard drinks    Comment: 1 glass of wine occasionally  . Drug use: No  . Sexual activity: Not on file  Other Topics Concern  . Not on file  Social History Narrative   Pt lives at home with her husband, daughter, and 2 grandchildren.    She has four children    Husband needs high-level of her care due to h/o brain tumor   Drives a city bus   [place] working outside in her yard and gardening.   drinks one cup of coffee daily.    Social Determinants of Health   Financial Resource Strain: Not on file  Food Insecurity: Not on file  Transportation Needs: Not on file  Physical Activity: Not on file  Stress: Not on file  Social Connections: Not on file    Family History  Problem Relation Age of Onset  . Healthy Mother     Past Medical History:  Diagnosis Date  . Anxiety   . Arthritis   . Carpal tunnel syndrome on both sides   . Complication of anesthesia    "can't stand mask over face." " It makes my head hurt"  . Depression   . Fibromyalgia   . GERD (gastroesophageal reflux disease)   . Headache(784.0)    Migraines  . Hypertension   . Multiple sites of bowel obstruction ([facility]) 2005  . Uterine fibroid     Past Surgical History:  Procedure Laterality Date  . CHOLECYSTECTOMY    . COLON RESECTION  1990  . endometrial curretage  2005  . GANGLION CYST  EXCISION Right 2001   hand  . HERNIA REPAIR Left    inguinal  . LUMBAR LAMINECTOMY/DECOMPRESSION MICRODISCECTOMY Left 03/09/2020   Procedure: Left L5-S1 disectomy;  Surgeon: Melina Schools, MD;  Location: [facility];  Service: Orthopedics;  Laterality: Left;  2 hrs  . TUBAL LIGATION       Current Outpatient Medications on File Prior to Visit  Medication Sig Dispense Refill  . amitriptyline (ELAVIL) 25 MG tablet Take 25 mg by mouth at bedtime.    Marland Kitchen  amLODipine-olmesartan (AZOR) 5-20 MG tablet Take 1 tablet by mouth daily.    . celecoxib (CELEBREX) 200 MG capsule Take 200 mg by mouth 2 (two) times daily.    . DULoxetine (CYMBALTA) 60 MG capsule Take 1 capsule (60 mg total) by mouth daily. 90 capsule 1  . EMGALITY 120 MG/ML SOAJ Inject 120 mg into the skin every 30 (thirty) days.    Marland Kitchen gabapentin (NEURONTIN) 300 MG capsule TAKE 2 CAPSULES (600 MG TOTAL) BY MOUTH 3 (THREE) TIMES DAILY. (Patient taking differently: Take 300-600 mg by mouth See admin instructions. Take 300 mg by mouth in the morning and evening and 600 mg at bedtime) 540 capsule 1  . HYDROcodone-acetaminophen (NORCO) 10-325 MG tablet Take 1 tablet by mouth every 6 (six) hours as needed.    Marland Kitchen omeprazole (PRILOSEC) 20 MG capsule Take 20 mg by mouth daily.    . ondansetron (ZOFRAN) 4 MG tablet Take 1 tablet (4 mg total) by mouth every 8 (eight) hours as needed for nausea or vomiting. 20 tablet 0  . pantoprazole (PROTONIX) 20 MG tablet Take 20 mg by mouth daily before supper.    . promethazine (PHENERGAN) 25 MG tablet Take 25 mg by mouth every 8 (eight) hours as needed for nausea or vomiting.     . SUMAtriptan (IMITREX) 100 MG tablet Take 100 mg by mouth every 2 (two) hours as needed for migraine. May repeat in 2 hours if headache persists or recurs.    . cyclobenzaprine (FLEXERIL) 10 MG tablet      No current facility-administered medications on file prior to visit.    Allergies  Allergen Reactions  . Topamax [Topiramate] Hives  .  Dilaudid [Hydromorphone Hcl] Hives    Physical exam:  Today's Vitals   10/26/20 1256  BP: 118/84  Pulse: 89  Weight: 181 lb (82.1 kg)  Height: 5' 4.5" (1.638 m)   Body mass index is 30.59 kg/m.   Wt Readings from Last 3 Encounters:  10/26/20 181 lb (82.1 kg)  06/18/20 180 lb (81.6 kg)  03/09/20 172 lb (78 kg)     Ht Readings from Last 3 Encounters:  10/26/20 5' 4.5" (1.638 m)  06/18/20 5\' 5"  (1.651 m)  03/09/20 5\' 4"  (1.626 m)      General: The patient is awake, alert and appears not in acute distress.  She appears defensive. The patient is groomed. Head: Normocephalic, atraumatic.  Neck is supple. Mallampati 2, deeply red mucosa.  neck circumference:15 inches . Nasal airflow patent.   Retrognathia is  seen.  Dental status: n/a  Cardiovascular:  Regular rate and cardiac rhythm by pulse,  without distended neck veins. Respiratory: Lungs are clear to auscultation.  Skin:  Without evidence of ankle edema, or rash. Trunk: The patient's posture is erect.   Neurologic exam : The patient is awake and alert, oriented to place and time.   Memory subjective described as intact.  Attention span & concentration ability appears normal.  Speech is fluent,  without  dysarthria, dysphonia or aphasia.  Mood and affect are appropriate.   Cranial nerves: no loss of smell or taste reported  Pupils are equal and briskly reactive to light. Funduscopic exam deferred. .  Extraocular movements in vertical and horizontal planes were intact and without nystagmus. No Diplopia. Visual fields by finger perimetry are intact. Vision changes with migraine .  Hearing was intact to soft voice and finger rubbing.   Facial sensation intact to fine touch. Facial motor strength is symmetric and  tongue and uvula move midline.  Neck ROM : rotation, tilt and flexion extension were normal for age and shoulder shrug was symmetrical.      Coordination: Rapid alternating movements in the fingers/hands were  of normal speed.  The Finger-to-nose maneuver was intact without evidence of ataxia, dysmetria or tremor.   Gait and station: Patient could rise unassisted from a seated position, but braced herself, walked without assistive device.  Stance is of normal width/ base and the patient turned with 3 steps. She moans in pain as she walks.  Toe and heel walk were deferred.  Deep tendon reflexes: in the  upper and lower extremities are symmetric and intact.  Babinski response was deferred .      After spending a total time of  45 minutes face to face and additional time for physical and neurologic examination, review of laboratory studies,  personal review of imaging studies, reports and results of other testing and review of referral information / records as far as provided in visit, I have established the following assessments:  1) The main reason for her sleep disorder is INSOMNIA du to pain, discomfort and some sleep habits - TV in bedroom.  2) her central apnea was the result of narctic  Medication overdose.   3) the only sign of sleep apnea OSA is a second hand re[ort of apnoeic breathing while in anesthesia.   4) migraines have decreased under 30 day injections with Dr. Brigitte Pulse.    My Plan is to proceed with:  1) I offered a HST for screening of sleep apnea. Patient reported she can't sleep when not in her own bed. She is not understanding how sleep and apnea may correlate to headaches.  She understood that we don't treat chronic insomnia , chronic pain here at [facility] _ neither failed back surgery patients nor fibromyalgia.      I plan to follow up either personally or through our NP within 3 month - only if the HST revealed Sleep disorder.  I do not accept the patient to the headche clinic based on her overlapping narcotic therapy and recent overdose.    CC: I will share my notes with my colleage Dr Brigitte Pulse.   Electronically signed by: Larey Seat, MD 10/26/2020 1:13 PM  [facility] Board certified by The [employer] of Sleep Medicine and Diplomate of the [employer] of Sleep Medicine. Board certified In Neurology through the [place], Fellow of the [employer] of Neurology. Medical Director of [facility].

## 2020-11-15 ENCOUNTER — Ambulatory Visit (INDEPENDENT_AMBULATORY_CARE_PROVIDER_SITE_OTHER): Payer: BC Managed Care – PPO | Admitting: Neurology

## 2020-11-15 DIAGNOSIS — F5104 Psychophysiologic insomnia: Secondary | ICD-10-CM

## 2020-11-15 DIAGNOSIS — T3994XS Poisoning by unspecified nonopioid analgesic, antipyretic and antirheumatic, undetermined, sequela: Secondary | ICD-10-CM

## 2020-11-15 DIAGNOSIS — G4733 Obstructive sleep apnea (adult) (pediatric): Secondary | ICD-10-CM | POA: Diagnosis not present

## 2020-11-15 DIAGNOSIS — G8929 Other chronic pain: Secondary | ICD-10-CM

## 2020-11-15 DIAGNOSIS — G478 Other sleep disorders: Secondary | ICD-10-CM

## 2020-11-16 NOTE — Progress Notes (Signed)
   [facility] (Watch PAT)  STUDY DATE: 11/15/20  DOB: January 28, 1956  MRN: [id number]  ORDERING CLINICIAN: Larey Seat, MD   REFERRING CLINICIAN: Shawnee Knapp, MD   CLINICAL INFORMATION/HISTORY: Diane Feutz Jenningsis a 65 y.o.  African American female patient and is seenon 10/26/2020  Upon referral by Dr [name]. Chiefsleep concern: no concerns according to patient. Upon questioning, she then admitted to an accidential opiate overdose last year and being told by anesthesiologist that she stopped breathing in the wake up room after Dr. Rolena Infante performed Low-Back surgery.  She was from there referred to pain management.   I have the pleasure of seeing Diane [name] today,a right-handed Black or [place] American female with a possible opiate induced central sleep Apnea disorder.  She reports shortness of breath, too. States her husband has never reported her to snore. She has not returned to work since January 2021 after a work related back injury while lifting.  She has a medical history of Anxiety, Arthritis, Carpal tunnel syndrome on both sides, Complication of anesthesia, Depression, GERD (gastroesophageal reflux disease), Headache(784.0), Hypertension, Multiple sites of bowel obstruction ([place]) (2005), and Uterine fibroid. Dr. Rolena Infante performed a surgery in June 2021, and by September she was seen in ED with narcotic overdose , he released her from his care last week.  Epworth sleepiness score: 5/24. BMI: 30.1 kg/m Neck Circumference:  15 "  FINDINGS:   Total Record Time (hours, min): 7 h 16 min  Total Sleep Time (hours, min):  3 h 43 min   Percent REM (%):    21.21 %   Calculated pAHI (per hour): 36.8       REM pAHI: 28.6   NREM pAHI: 38.7 Supine AHI: 41.8   Oxygen Saturation (%) Mean: 94  Minimum oxygen saturation (%):        86   O2 Saturation Range (%): 86-98  O2Saturation (minutes) <=88%: 3.6 min  Pulse Mean (bpm):    76  Pulse  Range (52-126)   IMPRESSION: This HST  has documented the presence of severe OSA (obstructive sleep apnea) , worse in supine sleep position ( AHI was 41.8/h) and associated with loud snoring. Not a REM dependent form of apnea and not associated with bradycardia.  There was no prolonged hypoxemia but frequent short episodes of oxygen desaturation.  The total recorded sleep time was reduced and attests to the patient's insomnia.   RECOMMENDATION:  This is a high degree of sleep apnea and poor sleep efficiency ( insomnia) was also noted. I would like to follow up with an attended sleep study for CPAP titration and tolerance of CPAP can be evaluated this way. If possible by insurance, I like to order a full night PAP titration after an interface fitting .  Plan B) if not permitted by insurance, I will use an auto-CPAP setting of 5-16 cm water, 3 cm EPR and long RAMP time with heated humidification.    Loosing weight will also reduce snoring and apnea.  Avoiding supine sleep position is important in this case. Insomnia dependent on pain according to patients self report.     INTERPRETING PHYSICIAN:  Larey Seat, MD   [facility] and [facility] Sleep Board certified by The [employer] of Sleep Medicine and Diplomate of the [employer] of Sleep Medicine. Board certified In Neurology through the [place], Fellow of the [employer] of Neurology. Medical Director of [employer].

## 2020-11-27 NOTE — Progress Notes (Signed)
IMPRESSION: This HST  has documented the presence of severe OSA (obstructive sleep apnea) , worse in supine sleep position ( AHI was 41.8/h) and associated with loud snoring. Not a REM dependent form of apnea and not associated with bradycardia.  There was no prolonged hypoxemia but frequent short episodes of oxygen desaturation.  The total recorded sleep time was reduced and attests to the patient's insomnia.   RECOMMENDATION:  This is a high degree of sleep apnea captured by HST and poor sleep efficiency ( insomnia) was also noted.  I would like to follow up with an attended sleep study for CPAP titration and tolerance of CPAP can be evaluated this way. If possible by insurance, I like to order a full night PAP titration after an interface fitting .  Plan B) if not permitted by insurance, I will use an auto-CPAP setting of 5-16 cm water, 3 cm EPR and long RAMP time with heated humidification.    Loosing weight will also reduce snoring and apnea.  Avoiding supine sleep position is important in this case. Insomnia dependent on pain according to patients self report. Pain management to be made aware of this.

## 2020-11-27 NOTE — Procedures (Signed)
[facility] (Watch PAT)  STUDY DATE: 11/15/20  DOB: 02/28/1956  MRN: [id number]  ORDERING CLINICIAN: Larey Seat, MD   REFERRING CLINICIAN: Shawnee Knapp, MD   CLINICAL INFORMATION/HISTORY: Diane Ostrum Jenningsis a 65 y.o.  African American female patient and is seenon 10/26/2020  Upon referral by Dr [name]. Chiefsleep concern: no concerns according to patient. Upon questioning, she then admitted to an accidential opiate overdose last year and being told by anesthesiologist that she stopped breathing in the wake up room after Dr. Rolena Infante performed Low-Back surgery.  She was from there referred to pain management.   I have the pleasure of seeing Diane [name] today,a right-handed Black or [place] American female with a possible opiate induced central sleep Apnea disorder.  She reports shortness of breath, too. States her husband has never reported her to snore. She has not returned to work since January 2021 after a work related back injury while lifting.  She has a medical history of Anxiety, Arthritis, Carpal tunnel syndrome on both sides, Complication of anesthesia, Depression, GERD (gastroesophageal reflux disease), Headache(784.0), Hypertension, Multiple sites of bowel obstruction ([place]) (2005), and Uterine fibroid. Dr. Rolena Infante performed a surgery in June 2021, and by September she was seen in ED with narcotic overdose , he released her from his care last week.  Epworth sleepiness score: 5/24. BMI: 30.1 kg/m Neck Circumference:  15 "  FINDINGS:   Total Record Time (hours, min): 7 h 16 min  Total Sleep Time (hours, min):  3 h 43 min   Percent REM (%):    21.21 %   Calculated pAHI (per hour): 36.8       REM pAHI: 28.6   NREM pAHI: 38.7 Supine AHI: 41.8   Oxygen Saturation (%) Mean: 94  Minimum oxygen saturation (%):        86   O2 Saturation Range (%): 86-98  O2Saturation (minutes) <=88%: 3.6 min  Pulse Mean (bpm):    76  Pulse Range  (52-126)   IMPRESSION: This HST  has documented the presence of severe OSA (obstructive sleep apnea) , worse in supine sleep position ( AHI was 41.8/h) and associated with loud snoring. Not a REM dependent form of apnea and not associated with bradycardia.  There was no prolonged hypoxemia but frequent short episodes of oxygen desaturation.  The total recorded sleep time was reduced and attests to the patient's insomnia.   RECOMMENDATION:  This is a high degree of sleep apnea and poor sleep efficiency ( insomnia) was also noted. I would like to follow up with an attended sleep study for CPAP titration and tolerance of CPAP can be evaluated this way. If possible by insurance, I like to order a full night PAP titration after an interface fitting .  Plan B) if not permitted by insurance, I will use an auto-CPAP setting of 5-16 cm water, 3 cm EPR and long RAMP time with heated humidification.    Loosing weight will also reduce snoring and apnea.  Avoiding supine sleep position is important in this case. Insomnia dependent on pain according to patients self report.     INTERPRETING PHYSICIAN:  Larey Seat, MD   [facility] and [facility] Sleep Board certified by The [employer] of Sleep Medicine and Diplomate of the [employer] of Sleep Medicine. Board certified In Neurology through the [place], Fellow of the [employer] of Neurology. Medical Director of [employer].

## 2020-11-27 NOTE — Addendum Note (Signed)
Addended by: Larey Seat on: 11/27/2020 01:40 PM   Modules accepted: Orders

## 2020-11-28 ENCOUNTER — Telehealth: Payer: Self-pay

## 2020-11-28 NOTE — Telephone Encounter (Signed)
I called pt. I advised pt that Dr. Brett Fairy reviewed their sleep study results and found that This HST has documented the presence of severe OSA (obstructive sleep apnea) , worse in supine sleep position ( AHI was 41.8/h) and associated with loud snoring. Not a REM dependent form of apnea and not associated with bradycardia and recommends that pt be treated with a cpap. Dr. Brett Fairy recommends that pt return for a repeat sleep study in order to properly titrate the cpap and ensure a good mask fit. Pt is agreeable to returning for a titration study. I advised pt that our sleep lab will file with pt's insurance and call pt to schedule the sleep study when we hear back from the pt's insurance regarding coverage of this sleep study. Pt verbalized understanding of results. Pt had no questions at this time but was encouraged to call back if questions arise.  Pt is scheduled for March 1st at 9pm

## 2020-12-05 ENCOUNTER — Ambulatory Visit (INDEPENDENT_AMBULATORY_CARE_PROVIDER_SITE_OTHER): Payer: BC Managed Care – PPO | Admitting: Neurology

## 2020-12-05 DIAGNOSIS — G4701 Insomnia due to medical condition: Secondary | ICD-10-CM

## 2020-12-05 DIAGNOSIS — G4733 Obstructive sleep apnea (adult) (pediatric): Secondary | ICD-10-CM

## 2020-12-05 DIAGNOSIS — F5104 Psychophysiologic insomnia: Secondary | ICD-10-CM

## 2020-12-05 DIAGNOSIS — G8929 Other chronic pain: Secondary | ICD-10-CM

## 2020-12-05 DIAGNOSIS — T3994XS Poisoning by unspecified nonopioid analgesic, antipyretic and antirheumatic, undetermined, sequela: Secondary | ICD-10-CM

## 2020-12-05 DIAGNOSIS — G478 Other sleep disorders: Secondary | ICD-10-CM

## 2020-12-18 DIAGNOSIS — G4733 Obstructive sleep apnea (adult) (pediatric): Secondary | ICD-10-CM | POA: Insufficient documentation

## 2020-12-18 NOTE — Progress Notes (Signed)
DIAGNOSIS 1. Snoring and severe Obstructive Sleep Apnea as noted in HST were eliminated under 8 cm water pressure with 3 cm EPR, the patient was not able to initiate sleep at pressure below 6 cm water.    PLANS/RECOMMENDATIONS: An auto CPAP device with pressure settings of 6-10 cm water pressure with 3 cm EPR and heated humidification will be ordered. The patient was fitted with a RESMED N30 Medium mask.    DISCUSSION: A follow up appointment will be scheduled with a NP in the Sleep Clinic at [facility].   Please call [phone] with any questions.    I certify that I have reviewed the entire raw data recording prior to the issuance of this report in accordance with the Standards of Accreditation of the [place] [employer] of Sleep Medicine (AASM)

## 2020-12-18 NOTE — Procedures (Signed)
PATIENT'S NAME:  Diane [name], Diane [name] DOB:      08-30-1956      MR#:    [id number]     DATE OF RECORDING: 12/05/2020 Robert Peak. REFERRING M.D.:  Laurey Arrow. Brigitte Pulse, MD Study Performed:   CPAP  Titration HISTORY:  -Diane [name] is a 65 y.o.  African American female patient and is seen on 10/26/2020 upon referral by Dr [name] after an accidental opiate overdose earlier last year and being told by anesthesiologist that she stopped breathing in the wake up room after Dr. Alvester Chou surgery.  She was referred to pain management. Dr Brigitte Pulse was also concerned about sleep apnea.  A HST from 11-15-2020 documented only 3 hours 43 minutes of sleep time with an AHI of 36.8/h. no prolonged hypoxemia was noted, no central apneas were documented. She is here for CPAP titration.   The patient endorsed the Epworth Sleepiness Scale at 5 points.   The patient's weight 181 pounds with a height of 64 (inches), resulting in a BMI of 30.9 kg/m2. The patient's neck circumference measured 15 inches.  CURRENT MEDICATIONS: Amitriptyline, Amlodipine-Olmesartan, Celecoxib, Cyclobenzaprine, Duloxetine, Gabapentin, Emgality, Norco, Omeprazole, Ondansetron, Pantoprazole, Promethazine, Sumatriptan    PROCEDURE:  This is a multichannel digital polysomnogram utilizing the SomnoStar 11.2 system.  Electrodes and sensors were applied and monitored per AASM Specifications.   EEG, EOG, Chin and Limb EMG, were sampled at 200 Hz.  ECG, Snore and Nasal Pressure, Thermal Airflow, Respiratory Effort, CPAP Flow and Pressure, Oximetry was sampled at 50 Hz. Digital video and audio were recorded.      CPAP was initiated with a ResMed N30 nasal cradle in medium at 6 cmH20 with 3 cm EPR and heated humidity per AASM standards. This pressure was advanced to8 because of hypopneas, apneas and desaturations.   At a PAP pressure of 8 cmH20, there was a reduction of the AHI to 0.0/h with improvement of sleep apnea.  Lights Out was at 22:35 and  Lights On at 04:58. Total recording time (TRT) was 383.5 minutes, with a total sleep time (TST) of 302.5 minutes. The patient's sleep latency was 50.5 minutes. REM latency was 0 minutes.  The sleep efficiency was 78.9 %.    SLEEP ARCHITECTURE: WASO (Wake after sleep onset) was 47 minutes.  There were 8.5 minutes in Stage N1, 291.5 minutes Stage N2, 2.5 minutes Stage N3 and 0 minutes in Stage REM.  The percentage of Stage N1 was 2.8%, Stage N2 was 96.4%, Stage N3 was .8% and Stage R (REM sleep) was 0%.     RESPIRATORY ANALYSIS:  There was a total of 0 respiratory events: 0 obstructive apneas, 0 central apneas and 0 mixed apneas with a total of 0 apneas and an apnea index (AI) of 0 /hour. There were 0 hypopneas with a hypopnea index of 0/hour. The patient also had 1 respiratory event related arousal (RERAs).      The total APNEA/HYPOPNEA INDEX  (AHI) was 0.0 /hour and the total RESPIRATORY DISTURBANCE INDEX was 0.2 /hour  0 events occurred in REM sleep and 0 events in NREM. The REM AHI was 0 /hour versus a non-REM AHI of 0 /hour.  The patient spent 68.5 minutes of total sleep time in the supine position and 234 minutes in non-supine. The supine AHI was 0.0, versus a non-supine AHI of 0.0.  OXYGEN SATURATION & C02:  The baseline 02 saturation was 95%, with the lowest being 87%. Time spent below 89% saturation equaled 1 minute.  The arousals were noted as: 34 were spontaneous, 0 were associated with PLMs, 0 were associated with respiratory events. The patient had a total of 0 Periodic Limb Movements.   Audio and video analysis did not show any abnormal or unusual movements, behaviors, phonations or vocalizations.   Mild Snoring was noted. EKG was in keeping with normal sinus rhythm (NSR).   DIAGNOSIS 1. Snoring and severe Obstructive Sleep Apnea as noted in HST were eliminated under 8 cm water pressure with 3 cm EPR, the patient was not able to initiate sleep at pressure below 6 cm water.     PLANS/RECOMMENDATIONS: An auto CPAP device with pressure settings of 6-10 cm water pressure with 3 cm EPR and heated humidification will be ordered. The patient was fitted with a RESMED N30 Medium mask.    DISCUSSION: A follow up appointment will be scheduled with a NP in the Sleep Clinic at [facility].   Please call [phone] with any questions.    I certify that I have reviewed the entire raw data recording prior to the issuance of this report in accordance with the Standards of Accreditation of the American Academy of Sleep Medicine (AASM)  Larey Seat, M.D. Diplomat, Tax adviser of Psychiatry and Neurology  Diplomat, Tax adviser of Sleep Medicine Market researcher, [employer] [facility] at [employer]

## 2020-12-18 NOTE — Addendum Note (Signed)
Addended by: Larey Seat on: 12/18/2020 05:36 PM   Modules accepted: Orders

## 2020-12-19 ENCOUNTER — Telehealth: Payer: Self-pay | Admitting: Neurology

## 2020-12-19 NOTE — Telephone Encounter (Signed)
I called pt. I advised pt that Dr. Brett Fairy reviewed their sleep study results and found that pt was best tolerated at CPAP pressure of 8 cm. Dr. Brett Fairy recommends that pt starts auto CPAP 6-10 cm water pressure. I reviewed PAP compliance expectations with the pt. Pt is agreeable to starting a CPAP. I advised pt that an order will be sent to a DME, [facility], and [facility] will call the pt within about one week after they file with the pt's insurance. [facility] will show the pt how to use the machine, fit for masks, and troubleshoot the CPAP if needed. A follow up appt was made for insurance purposes with Debbora Presto, NP on June 27,2022 at 1:30 pm. Pt verbalized understanding to arrive 15 minutes early and bring their CPAP. A letter with all of this information in it will be mailed to the pt as a reminder. I verified with the pt that the address we have on file is correct. Pt verbalized understanding of results. Pt had no questions at this time but was encouraged to call back if questions arise. I have sent the order to [facility] and have received confirmation that they have received the order.

## 2020-12-19 NOTE — Telephone Encounter (Signed)
-----   Message from Larey Seat, MD sent at 12/18/2020  5:36 PM EDT ----- DIAGNOSIS 1. Snoring and severe Obstructive Sleep Apnea as noted in HST were eliminated under 8 cm water pressure with 3 cm EPR, the patient was not able to initiate sleep at pressure below 6 cm water.    PLANS/RECOMMENDATIONS: An auto CPAP device with pressure settings of 6-10 cm water pressure with 3 cm EPR and heated humidification will be ordered. The patient was fitted with a RESMED N30 Medium mask.    DISCUSSION: A follow up appointment will be scheduled with a NP in the Sleep Clinic at [facility].   Please call [phone] with any questions.    I certify that I have reviewed the entire raw data recording prior to the issuance of this report in accordance with the Standards of Accreditation of the [place] Academy of Sleep Medicine (AASM)

## 2021-02-15 ENCOUNTER — Ambulatory Visit: Payer: Self-pay

## 2021-02-15 ENCOUNTER — Ambulatory Visit (INDEPENDENT_AMBULATORY_CARE_PROVIDER_SITE_OTHER): Payer: Self-pay | Admitting: Physician Assistant

## 2021-02-15 ENCOUNTER — Ambulatory Visit (INDEPENDENT_AMBULATORY_CARE_PROVIDER_SITE_OTHER): Payer: Self-pay

## 2021-02-15 ENCOUNTER — Encounter: Payer: Self-pay | Admitting: Orthopedic Surgery

## 2021-02-15 DIAGNOSIS — M25562 Pain in left knee: Secondary | ICD-10-CM

## 2021-02-15 DIAGNOSIS — G8929 Other chronic pain: Secondary | ICD-10-CM

## 2021-02-15 DIAGNOSIS — M25561 Pain in right knee: Secondary | ICD-10-CM

## 2021-02-15 MED ORDER — LIDOCAINE HCL 1 % IJ SOLN
5.0000 mL | INTRAMUSCULAR | Status: AC | PRN
  Administered 2021-02-15: 5 mL

## 2021-02-15 MED ORDER — METHYLPREDNISOLONE ACETATE 40 MG/ML IJ SUSP
40.0000 mg | INTRAMUSCULAR | Status: AC | PRN
  Administered 2021-02-15: 40 mg via INTRA_ARTICULAR

## 2021-02-15 NOTE — Progress Notes (Signed)
Office Visit Note   Patient: Diane [name]           Date of Birth: [birth year]           MRN: [id number] Visit Date: 02/15/2021              Requested by: Shawnee Knapp, MD [employer],  [place] [address] PCP: Shawnee Knapp, MD  Chief Complaint  Patient presents with  . Right Knee - Pain  . Left Knee - Pain      HPI: Patient is a pleasant 65 year old woman with a chief complaint of bilateral knee pain.  She denies any injuries but has had increasing pain in her knees.  She had an injection into the left knee about a year ago of some steroid and it really helped her.  In the last month the right knee is really started bothering her as well.  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  Assessment & Plan: Visit Diagnoses:  1. Chronic pain of both knees     Plan: Patient will follow up in 4 weeks for reevaluation.  She would like to go forward with injections bilaterally today  Follow-Up Instructions: No follow-ups on file.   Ortho Exam  Patient is alert, oriented, no adenopathy, well-dressed, normal affect, normal respiratory effort. Examination of her right knee demonstrates mild soft tissue swelling but no effusion she is globally tender over the medial and lateral joint line as well as the patellofemoral joint.  No effusion pain is accentuated with range of motion.  No cellulitis.  Left knee no effusion no cellulitis or erythema.  Tenderness over the medial lateral joint line bilaterally she has good stability  Imaging: No results found. No images are attached to the encounter.  Labs: Lab Results  Component Value Date   HGBA1C 5.5 03/05/2018   HGBA1C 5.7 (H) 05/08/2017   ESRSEDRATE CANCELED 04/30/2018   ESRSEDRATE 26 03/05/2018   ESRSEDRATE 7 01/12/2018   CRP 7.1 (H) 03/05/2018   CRP 2.7 07/10/2017   REPTSTATUS 02/17/2013 FINAL 02/16/2013   CULT INSIGNIFICANT GROWTH 02/16/2013   LABORGA  NO GROWTH 01/27/2015     Lab Results  Component Value Date   ALBUMIN 4.2 06/18/2020   ALBUMIN 4.6 03/05/2018   ALBUMIN 4.5 07/10/2017    Lab Results  Component Value Date   MG 2.0 01/27/2015   Lab Results  Component Value Date   VD25OH 12.8 (L) 09/10/2018   VD25OH 11.4 (L) 01/12/2018   VD25OH 31.9 09/20/2016    No results found for: PREALBUMIN CBC EXTENDED Latest Ref Rng & Units 06/18/2020 06/18/2020 03/02/2020  WBC 4.0 - 10.5 K/uL 5.0 - 5.1  RBC 3.87 - 5.11 MIL/uL 4.82 - 4.70  HGB 12.0 - 15.0 g/dL 13.6 13.6 13.4  HCT 36.0 - 46.0 % 42.2 40.0 40.9  PLT 150 - 400 K/uL 373 - 321  NEUTROABS 1.7 - 7.7 K/uL 3.2 - -  LYMPHSABS 0.7 - 4.0 K/uL 1.3 - -     There is no height or weight on file to calculate BMI.  Orders:  Orders Placed This Encounter  Procedures  . XR Knee 1-2 Views Right  . XR Knee 1-2 Views Left   No orders of the defined types were placed in this encounter.    Procedures: Large Joint Inj: bilateral knee on 02/15/2021 4:37 PM Indications: pain and diagnostic evaluation Details: 22 G  1.5 in needle, anteromedial approach  Arthrogram: No  Medications (Right): 5 mL lidocaine 1 %; 40 mg methylPREDNISolone acetate 40 MG/ML Medications (Left): 5 mL lidocaine 1 %; 40 mg methylPREDNISolone acetate 40 MG/ML Outcome: tolerated well, no immediate complications Procedure, treatment alternatives, risks and benefits explained, specific risks discussed. Consent was given by the patient.      Clinical Data: No additional findings.  ROS:  All other systems negative, except as noted in the HPI. Review of Systems  Objective: Vital Signs: There were no vitals taken for this visit.  Specialty Comments:  No specialty comments available.  PMFS History: Patient Active Problem List   Diagnosis Date Noted  . Severe obstructive sleep apnea-hypopnea syndrome 12/18/2020  . Analgesic overuse headache 10/26/2020  . Insomnia secondary to chronic pain 10/26/2020  .  Poor sleep pattern 10/26/2020  . Chronic insomnia 10/26/2020  . Overdose of analgesic 10/26/2020  . Lumbar disc herniation 03/09/2020  . Adrenal nodule ([place]) 09/27/2016  . Fibromyalgia 02/01/2016  . Arthritis, senescent 02/01/2016  . Vitamin D deficiency 02/01/2016  . Depression 08/31/2015  . Leukopenia 08/31/2015  . Polypharmacy 08/31/2015  . Migraine headache without aura 12/31/2012  . Hypertension 12/31/2012  . Insomnia 12/31/2012   Past Medical History:  Diagnosis Date  . Anxiety   . Arthritis   . Carpal tunnel syndrome on both sides   . Complication of anesthesia    "can't stand mask over face." " It makes my head hurt"  . Depression   . Fibromyalgia   . GERD (gastroesophageal reflux disease)   . Headache(784.0)    Migraines  . Hypertension   . Multiple sites of bowel obstruction ([place]) 2005  . Uterine fibroid     Family History  Problem Relation Age of Onset  . Healthy Mother     Past Surgical History:  Procedure Laterality Date  . CHOLECYSTECTOMY    . COLON RESECTION  1990  . endometrial curretage  2005  . GANGLION CYST EXCISION Right 2001   hand  . HERNIA REPAIR Left    inguinal  . LUMBAR LAMINECTOMY/DECOMPRESSION MICRODISCECTOMY Left 03/09/2020   Procedure: Left L5-S1 disectomy;  Surgeon: Melina Schools, MD;  Location: [place];  Service: Orthopedics;  Laterality: Left;  2 hrs  . TUBAL LIGATION     Social History   Occupational History    Comment: [employer]  Tobacco Use  . Smoking status: Former Smoker    Years: 8.00    Quit date: 10/08/1987    Years since quitting: 33.3  . Smokeless tobacco: Never Used  Vaping Use  . Vaping Use: Never used  Substance and Sexual Activity  . Alcohol use: Yes    Alcohol/week: 0.0 standard drinks    Comment: 1 glass of wine occasionally  . Drug use: No  . Sexual activity: Not on file

## 2021-02-16 ENCOUNTER — Ambulatory Visit: Payer: BC Managed Care – PPO | Admitting: Physician Assistant

## 2021-02-19 ENCOUNTER — Ambulatory Visit: Payer: BC Managed Care – PPO | Admitting: Family

## 2021-03-15 ENCOUNTER — Ambulatory Visit: Payer: Self-pay

## 2021-03-15 ENCOUNTER — Encounter: Payer: Self-pay | Admitting: Orthopedic Surgery

## 2021-03-15 ENCOUNTER — Ambulatory Visit (INDEPENDENT_AMBULATORY_CARE_PROVIDER_SITE_OTHER): Payer: Self-pay | Admitting: Physician Assistant

## 2021-03-15 DIAGNOSIS — M25561 Pain in right knee: Secondary | ICD-10-CM

## 2021-03-15 NOTE — Progress Notes (Signed)
Office Visit Note   Patient: Diane [name]           Date of Birth: 1956-07-11           MRN: [id number] Visit Date: 03/15/2021              Requested by: Shawnee Knapp, MD [place],  [place] [address] PCP: Shawnee Knapp, MD  Chief Complaint  Patient presents with   Left Knee - Follow-up    S/p cortisone injection 02/15/21   Right Knee - Follow-up    S/p fall direct impact on right knee 03/11/21      HPI: Patient is in follow-up today she is status post bilateral knee injections.  She does think it helped her.  Unfortunately she was riding the transportation bus when it stopped suddenly and she fell onto her right knee.  That really created pain medially.  She says she mostly notices it when she is sleeping at night and her the inner parts of her knees are resting together  Assessment & Plan: Visit Diagnoses:  1. Acute pain of right knee     Plan: Recommended Voltaren gel icing as needed.  We will follow-up in 1 month.  Follow-Up Instructions: No follow-ups on file.   Ortho Exam  Patient is alert, oriented, no adenopathy, well-dressed, normal affect, normal respiratory effort. Examination of the right knee she has tenderness medially.  Ligamentous intact.  No pain with valgus stress.  Good endpoint on Lachman testing.  No effusion mild soft tissue swelling no erythema.  She denies any instability  Imaging: No results found. No images are attached to the encounter.  Labs: Lab Results  Component Value Date   HGBA1C 5.5 03/05/2018   HGBA1C 5.7 (H) 05/08/2017   ESRSEDRATE CANCELED 04/30/2018   ESRSEDRATE 26 03/05/2018   ESRSEDRATE 7 01/12/2018   CRP 7.1 (H) 03/05/2018   CRP 2.7 07/10/2017   REPTSTATUS 02/17/2013 FINAL 02/16/2013   CULT INSIGNIFICANT GROWTH 02/16/2013   LABORGA NO GROWTH 01/27/2015     Lab Results  Component Value Date   ALBUMIN 4.2 06/18/2020   ALBUMIN 4.6 03/05/2018   ALBUMIN 4.5 07/10/2017    Lab Results  Component Value  Date   MG 2.0 01/27/2015   Lab Results  Component Value Date   VD25OH 12.8 (L) 09/10/2018   VD25OH 11.4 (L) 01/12/2018   VD25OH 31.9 09/20/2016    No results found for: PREALBUMIN CBC EXTENDED Latest Ref Rng & Units 06/18/2020 06/18/2020 03/02/2020  WBC 4.0 - 10.5 K/uL 5.0 - 5.1  RBC 3.87 - 5.11 MIL/uL 4.82 - 4.70  HGB 12.0 - 15.0 g/dL 13.6 13.6 13.4  HCT 36.0 - 46.0 % 42.2 40.0 40.9  PLT 150 - 400 K/uL 373 - 321  NEUTROABS 1.7 - 7.7 K/uL 3.2 - -  LYMPHSABS 0.7 - 4.0 K/uL 1.3 - -     There is no height or weight on file to calculate BMI.  Orders:  Orders Placed This Encounter  Procedures   XR Knee 1-2 Views Right   No orders of the defined types were placed in this encounter.    Procedures: No procedures performed  Clinical Data: No additional findings.  ROS:  All other systems negative, except as noted in the HPI. Review of Systems  Objective: Vital Signs: There were no vitals taken for this visit.  Specialty Comments:  No specialty comments available.  PMFS History: Patient Active Problem List   Diagnosis Date  Noted   Severe obstructive sleep apnea-hypopnea syndrome 12/18/2020   Analgesic overuse headache 10/26/2020   Insomnia secondary to chronic pain 10/26/2020   Poor sleep pattern 10/26/2020   Chronic insomnia 10/26/2020   Overdose of analgesic 10/26/2020   Lumbar disc herniation 03/09/2020   Adrenal nodule ([place]) 09/27/2016   Fibromyalgia 02/01/2016   Arthritis, senescent 02/01/2016   Vitamin D deficiency 02/01/2016   Depression 08/31/2015   Leukopenia 08/31/2015   Polypharmacy 08/31/2015   Migraine headache without aura 12/31/2012   Hypertension 12/31/2012   Insomnia 12/31/2012   Past Medical History:  Diagnosis Date   Anxiety    Arthritis    Carpal tunnel syndrome on both sides    Complication of anesthesia    "can't stand mask over face." " It makes my head hurt"   Depression    Fibromyalgia    GERD (gastroesophageal reflux disease)     Headache(784.0)    Migraines   Hypertension    Multiple sites of bowel obstruction ([place]) 2005   Uterine fibroid     Family History  Problem Relation Age of Onset   Healthy Mother     Past Surgical History:  Procedure Laterality Date   CHOLECYSTECTOMY     COLON RESECTION  1990   endometrial curretage  2005   GANGLION CYST EXCISION Right 2001   hand   HERNIA REPAIR Left    inguinal   LUMBAR LAMINECTOMY/DECOMPRESSION MICRODISCECTOMY Left 03/09/2020   Procedure: Left L5-S1 disectomy;  Surgeon: Melina Schools, MD;  Location: [facility];  Service: Orthopedics;  Laterality: Left;  2 hrs   TUBAL LIGATION     Social History   Occupational History    Comment: [employer]  Tobacco Use   Smoking status: Former    Years: 8.00    Pack years: 0.00    Types: Cigarettes    Quit date: 10/08/1987    Years since quitting: 33.4   Smokeless tobacco: Never  Vaping Use   Vaping Use: Never used  Substance and Sexual Activity   Alcohol use: Yes    Alcohol/week: 0.0 standard drinks    Comment: 1 glass of wine occasionally   Drug use: No   Sexual activity: Not on file

## 2021-04-02 ENCOUNTER — Ambulatory Visit: Payer: Self-pay | Admitting: Family Medicine

## 2021-04-02 ENCOUNTER — Telehealth: Payer: Self-pay | Admitting: Neurology

## 2021-04-02 ENCOUNTER — Telehealth: Payer: Self-pay

## 2021-04-02 NOTE — Telephone Encounter (Signed)
Pt will call back when she receives her CPAP machine.  (Needs 30-60 day f/u from starting cpap).

## 2021-04-02 NOTE — Telephone Encounter (Signed)
Called and lvm. Pt has not received cpap per DME. They should be calling her any day not to pick up. Pt needs to r/s in 6 week

## 2021-04-02 NOTE — Telephone Encounter (Signed)
Pt was notified by nurse need to reschedule appt due to; have not received CPAP machine

## 2021-04-02 NOTE — Telephone Encounter (Signed)
Diane [name], can you please r/s? Looks like she was scheduled today with AL,NP. Thank you

## 2021-04-02 NOTE — Telephone Encounter (Signed)
Called and LVM to see if she received her cpap. She is not in resmed or care orches. Is she received it and is using it, she needs to bring it with her to her appt. Along with her charger.

## 2021-04-02 NOTE — Progress Notes (Deleted)
PATIENT: Diane [name] DOB: 1955/12/08  REASON FOR VISIT: follow up HISTORY FROM: patient  No chief complaint on file.    HISTORY OF PRESENT ILLNESS: 04/02/21 ALL:  Diane [name] is a 65 y.o. female here today for follow up for OSA on CPAP. HST 11/2020 showed severe OSA with AHI 41.8 without prolonged hypoxemia. CPAP titration 12/2020 showed apnea managed at [facility]. She was unable to initiate sleep at pressure below 6 and AutoPAP 6-10cmH20 ordered.    HISTORY: (copied from Dr Dohmeier's previous note) Diane [name] is a 65 y.o.  African American female patient and is seen on 10/26/2020  Upon referral by Dr [name]. Chief concern :  no concern according to patient. She then admitted to an accidential opiate overdose earlier last year and being told by anesthesiologist that she stopped breathing in the wake up room after Dr. Rolena Infante Low-Back surgery.  She was referred to pain management.   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  She reports shortness of breath, too. States her husband has never reported her to snore. She has not returned to work since January 2021 after a work related back injury while lifting.  She  has a past medical history of Anxiety, Arthritis, Carpal tunnel syndrome on both sides, Complication of anesthesia, Depression, Fibromyalgia, GERD (gastroesophageal reflux disease), Headache(784.0), Hypertension, Multiple sites of bowel obstruction ([place]) (2005), and Uterine fibroid. Dr. Rolena Infante performed a surgery in June, and by September she was seen in ED with narcotic overdose ,  released her from his care last week.   Sleep relevant medical history: had an opiate overdose, chronic user of narcotics, has chronic pain and chronic headaches interrupting sleep frequently. Nocturia 3 times or more . No cervical neck injuries and no  sinus problems reported.  Family medical /sleep history: No other family member on CPAP with OSA.    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 She lives in a household with husband and grand son ( 10). Pets are present, 3 dogs.Tobacco use; none .   ETOH use; rare , high Caffeine intake in form of Coffee(1 cup in AM ) Soda(very day 2-4) Tea ( 2-4) or energy drinks.    Sleep habits are as follows: The patient's dinner time is between 6-7 PM. The patient goes to bed at 9-10 PM, she struggles to sleep- often until 1-2 in AM, cool, but not quiet and dark with the TV running. Both feel the need for TV on all night. She continues to sleep for intervals of 1-2 hours, wakes for frequent  bathroom breaks and MOSTLY PAIN _ Chronic INSOMNIA since January 2021 due to back pain.     The preferred sleep position is not named, with the support of 3 pillows. Dreams are reportedly rare.  AM is the usual rise time. The patient wakes up spontaneously. She reports eating only one meal a day, not feeling refreshed or restored in AM, with symptoms such as dry mouth, morning headaches, and residual fatigue. Naps are taken infrequently, she can't sleep.    REVIEW OF SYSTEMS: Out of a complete 14 system review of symptoms, the patient complains only of the following symptoms, and all other reviewed systems are negative.  ESS: FSS:  ALLERGIES: Allergies  Allergen Reactions   Topamax [Topiramate] Hives   Dilaudid [Hydromorphone Hcl]  Hives    HOME MEDICATIONS: Outpatient Medications Prior to Visit  Medication Sig Dispense Refill   amitriptyline (ELAVIL) 25 MG tablet Take 25 mg by mouth at bedtime.     amLODipine-olmesartan (AZOR) 5-20 MG tablet Take 1 tablet by mouth daily.     celecoxib (CELEBREX) 200 MG capsule Take 200 mg by mouth 2 (two) times daily.     cyclobenzaprine (FLEXERIL) 10 MG tablet      DULoxetine (CYMBALTA) 60 MG capsule Take 1 capsule (60 mg total) by  mouth daily. 90 capsule 1   EMGALITY 120 MG/ML SOAJ Inject 120 mg into the skin every 30 (thirty) days.     gabapentin (NEURONTIN) 300 MG capsule TAKE 2 CAPSULES (600 MG TOTAL) BY MOUTH 3 (THREE) TIMES DAILY. (Patient taking differently: Take 300-600 mg by mouth See admin instructions. Take 300 mg by mouth in the morning and evening and 600 mg at bedtime) 540 capsule 1   HYDROcodone-acetaminophen (NORCO) 10-325 MG tablet Take 1 tablet by mouth every 6 (six) hours as needed.     omeprazole (PRILOSEC) 20 MG capsule Take 20 mg by mouth daily.     ondansetron (ZOFRAN) 4 MG tablet Take 1 tablet (4 mg total) by mouth every 8 (eight) hours as needed for nausea or vomiting. 20 tablet 0   pantoprazole (PROTONIX) 20 MG tablet Take 20 mg by mouth daily before supper.     promethazine (PHENERGAN) 25 MG tablet Take 25 mg by mouth every 8 (eight) hours as needed for nausea or vomiting.      SUMAtriptan (IMITREX) 100 MG tablet Take 100 mg by mouth every 2 (two) hours as needed for migraine. May repeat in 2 hours if headache persists or recurs.     No facility-administered medications prior to visit.    PAST MEDICAL HISTORY: Past Medical History:  Diagnosis Date   Anxiety    Arthritis    Carpal tunnel syndrome on both sides    Complication of anesthesia    "can't stand mask over face." " It makes my head hurt"   Depression    Fibromyalgia    GERD (gastroesophageal reflux disease)    Headache(784.0)    Migraines   Hypertension    Multiple sites of bowel obstruction ([place]) 2005   Uterine fibroid     PAST SURGICAL HISTORY: Past Surgical History:  Procedure Laterality Date   CHOLECYSTECTOMY     COLON RESECTION  1990   endometrial curretage  2005   GANGLION CYST EXCISION Right 2001   hand   HERNIA REPAIR Left    inguinal   LUMBAR LAMINECTOMY/DECOMPRESSION MICRODISCECTOMY Left 03/09/2020   Procedure: Left L5-S1 disectomy;  Surgeon: Melina Schools, MD;  Location: [facility];  Service: Orthopedics;   Laterality: Left;  2 hrs   TUBAL LIGATION      FAMILY HISTORY: Family History  Problem Relation Age of Onset   Healthy Mother     SOCIAL HISTORY: Social History   Socioeconomic History   Marital status: Married    Spouse name: [name]   Number of children: 4   Years of education: 12   Highest education level: Not on file  Occupational History    Comment: [employer]  Tobacco Use   Smoking status: Former    Years: 8.00    Pack years: 0.00    Types: Cigarettes    Quit date: 10/08/1987    Years since quitting: 33.5   Smokeless tobacco: Never  Vaping Use   Vaping Use: Never used  Substance and Sexual Activity   Alcohol use: Yes    Alcohol/week: 0.0 standard drinks    Comment: 1 glass of wine occasionally   Drug use: No   Sexual activity: Not on file  Other Topics Concern   Not on file  Social History Narrative   Pt lives at home with her husband, daughter, and 2 grandchildren.    She has four children    Husband needs high-level of her care due to h/o brain tumor   Drives a city bus   Joes working outside in her yard and gardening.   drinks one cup of coffee daily.    Social Determinants of Health   Financial Resource Strain: Not on file  Food Insecurity: Not on file  Transportation Needs: Not on file  Physical Activity: Not on file  Stress: Not on file  Social Connections: Not on file  Intimate Partner Violence: Not on file     PHYSICAL EXAM  There were no vitals filed for this visit. There is no height or weight on file to calculate BMI.  Generalized: Well developed, in no acute distress  Cardiology: normal rate and rhythm, no murmur noted Respiratory: clear to auscultation bilaterally  Neurological examination  Mentation: Alert oriented to time, place, history taking. Follows all commands speech and language fluent Cranial nerve II-XII: Pupils were equal round reactive to light. Extraocular movements were full, visual field were full  Motor: The motor  testing reveals 5 over 5 strength of all 4 extremities. Good symmetric motor tone is noted throughout.  Gait and station: Gait is normal.    DIAGNOSTIC DATA (LABS, IMAGING, TESTING) - I reviewed patient records, labs, notes, testing and imaging myself where available.  No flowsheet data found.   Lab Results  Component Value Date   WBC 5.0 06/18/2020   HGB 13.6 06/18/2020   HCT 42.2 06/18/2020   MCV 87.6 06/18/2020   PLT 373 06/18/2020      Component Value Date/Time   NA 139 06/18/2020 0118   NA 143 03/05/2018 1844   K 3.1 (L) 06/18/2020 0118   CL 103 06/18/2020 0118   CO2 22 06/18/2020 0118   GLUCOSE 115 (H) 06/18/2020 0118   BUN 18 06/18/2020 0118   BUN 9 03/05/2018 1844   CREATININE 1.66 (H) 06/18/2020 0118   CREATININE 0.68 05/08/2016 1641   CALCIUM 8.9 06/18/2020 0118   PROT 7.1 06/18/2020 0118   PROT 7.4 03/05/2018 1844   ALBUMIN 4.2 06/18/2020 0118   ALBUMIN 4.6 03/05/2018 1844   AST 33 06/18/2020 0118   ALT 19 06/18/2020 0118   ALKPHOS 83 06/18/2020 0118   BILITOT 0.7 06/18/2020 0118   BILITOT 0.3 03/05/2018 1844   GFRNONAA 33 (L) 06/18/2020 0118   GFRAA 38 (L) 06/18/2020 0118   Lab Results  Component Value Date   CHOL 181 05/08/2016   HDL 50 05/08/2016   LDLCALC 111 05/08/2016   TRIG 98 05/08/2016   CHOLHDL 3.6 05/08/2016   Lab Results  Component Value Date   HGBA1C 5.5 03/05/2018   Lab Results  Component Value Date   VITAMINB12 1,210 01/12/2018   Lab Results  Component Value Date   TSH 1.790 01/12/2018     ASSESSMENT AND PLAN 65 y.o. year old female  has a past medical history of Anxiety, Arthritis, Carpal tunnel syndrome on both sides, Complication of anesthesia, Depression, Fibromyalgia, GERD (gastroesophageal reflux disease), Headache(784.0), Hypertension, Multiple sites of bowel obstruction ([place]) (2005), and Uterine fibroid. here with  No diagnosis found.    KORBIN NOTARO is doing well on CPAP therapy. Compliance report reveals  ***. *** was encouraged to continue using CPAP nightly and for greater than 4 hours each night. We will update supply orders as indicated. Risks of untreated sleep apnea review and education materials provided. Healthy lifestyle habits encouraged. *** will follow up in ***, sooner if needed. *** verbalizes understanding and agreement with this plan.    No orders of the defined types were placed in this encounter.    No orders of the defined types were placed in this encounter.     Debbora Presto, FNP-C 04/02/2021, 9:59 AM [facility] [address], [place], [place] [address] [phone]

## 2021-04-05 ENCOUNTER — Encounter: Payer: Self-pay | Admitting: Orthopedic Surgery

## 2021-04-05 ENCOUNTER — Ambulatory Visit (INDEPENDENT_AMBULATORY_CARE_PROVIDER_SITE_OTHER): Payer: 59 | Admitting: Physician Assistant

## 2021-04-05 DIAGNOSIS — M25561 Pain in right knee: Secondary | ICD-10-CM

## 2021-04-05 MED ORDER — PREDNISONE 10 MG PO TABS: 10.0000 mg | ORAL_TABLET | Freq: Every day | ORAL | 0 refills | Status: DC

## 2021-04-05 NOTE — Progress Notes (Signed)
Office Visit Note   Patient: Diane [name]           Date of Birth: 1956/02/18           MRN: [id number] Visit Date: 04/05/2021              Requested by: Shawnee Knapp, MD [place],  [place] [address] PCP: Shawnee Knapp, MD  Chief Complaint  Patient presents with   Right Knee - Follow-up   Left Knee - Follow-up      HPI: Patient is a pleasant 65 year old woman who follows up today for her bilateral knee pain.  This is status post a fall onto her right knee while on a transportation bus.  Prior to this fall she did have bilateral knee injections which did help her.  Since the fall her right knee has continued to hurt and some of the pain is returned in the left.  She points to the area of the medial side of the knee outside the knee joint.  She is also wondering if she can get another injection into her left knee.  She also complains intermittently of radicular kind of pain that runs down her back into her foot.  Denies any numbness.  Assessment & Plan: Visit Diagnoses:  1. Acute pain of right knee     Plan: On the right knee I feel this is more extra-articular she seems to be tender over the MCL and with valgus stress.  We will order an MRI.  On the left knee will go forward with another injection today.  We will also start her on a course of oral oral prednisone 10 mg with breakfast follow-up once the MRI is complete  Follow-Up Instructions: No follow-ups on file.   Ortho Exam  Patient is alert, oriented, no adenopathy, well-dressed, normal affect, normal respiratory effort. Right knee no effusion no erythema mild soft tissue swelling good endpoint on stability testing she is directly tender over the MCL which pain is increased with valgus stress.  No tender lightness over the joint line itself.  On the left knee she does have medial lateral joint line tenderness no effusion no swelling.  She has pain in her lower back radiating to her buttock and down into her leg.   No paresthesias.  Strength is 5 out of 5  Imaging: No results found. No images are attached to the encounter.  Labs: Lab Results  Component Value Date   HGBA1C 5.5 03/05/2018   HGBA1C 5.7 (H) 05/08/2017   ESRSEDRATE CANCELED 04/30/2018   ESRSEDRATE 26 03/05/2018   ESRSEDRATE 7 01/12/2018   CRP 7.1 (H) 03/05/2018   CRP 2.7 07/10/2017   REPTSTATUS 02/17/2013 FINAL 02/16/2013   CULT INSIGNIFICANT GROWTH 02/16/2013   LABORGA NO GROWTH 01/27/2015     Lab Results  Component Value Date   ALBUMIN 4.2 06/18/2020   ALBUMIN 4.6 03/05/2018   ALBUMIN 4.5 07/10/2017    Lab Results  Component Value Date   MG 2.0 01/27/2015   Lab Results  Component Value Date   VD25OH 12.8 (L) 09/10/2018   VD25OH 11.4 (L) 01/12/2018   VD25OH 31.9 09/20/2016    No results found for: PREALBUMIN CBC EXTENDED Latest Ref Rng & Units 06/18/2020 06/18/2020 03/02/2020  WBC 4.0 - 10.5 K/uL 5.0 - 5.1  RBC 3.87 - 5.11 MIL/uL 4.82 - 4.70  HGB 12.0 - 15.0 g/dL 13.6 13.6 13.4  HCT 36.0 - 46.0 % 42.2 40.0 40.9  PLT  150 - 400 K/uL 373 - 321  NEUTROABS 1.7 - 7.7 K/uL 3.2 - -  LYMPHSABS 0.7 - 4.0 K/uL 1.3 - -     There is no height or weight on file to calculate BMI.  Orders:  Orders Placed This Encounter  Procedures   MR Knee Right w/o contrast   Meds ordered this encounter  Medications   predniSONE (DELTASONE) 10 MG tablet    Sig: Take 1 tablet (10 mg total) by mouth daily with breakfast.    Dispense:  30 tablet    Refill:  0     Procedures: No procedures performed  Clinical Data: No additional findings.  ROS:  All other systems negative, except as noted in the HPI. Review of Systems  Objective: Vital Signs: There were no vitals taken for this visit.  Specialty Comments:  No specialty comments available.  PMFS History: Patient Active Problem List   Diagnosis Date Noted   Severe obstructive sleep apnea-hypopnea syndrome 12/18/2020   Analgesic overuse headache 10/26/2020    Insomnia secondary to chronic pain 10/26/2020   Poor sleep pattern 10/26/2020   Chronic insomnia 10/26/2020   Overdose of analgesic 10/26/2020   Lumbar disc herniation 03/09/2020   Adrenal nodule ([place]) 09/27/2016   Fibromyalgia 02/01/2016   Arthritis, senescent 02/01/2016   Vitamin D deficiency 02/01/2016   Depression 08/31/2015   Leukopenia 08/31/2015   Polypharmacy 08/31/2015   Migraine headache without aura 12/31/2012   Hypertension 12/31/2012   Insomnia 12/31/2012   Past Medical History:  Diagnosis Date   Anxiety    Arthritis    Carpal tunnel syndrome on both sides    Complication of anesthesia    "can't stand mask over face." " It makes my head hurt"   Depression    Fibromyalgia    GERD (gastroesophageal reflux disease)    Headache(784.0)    Migraines   Hypertension    Multiple sites of bowel obstruction ([place]) 2005   Uterine fibroid     Family History  Problem Relation Age of Onset   Healthy Mother     Past Surgical History:  Procedure Laterality Date   CHOLECYSTECTOMY     COLON RESECTION  1990   endometrial curretage  2005   GANGLION CYST EXCISION Right 2001   hand   HERNIA REPAIR Left    inguinal   LUMBAR LAMINECTOMY/DECOMPRESSION MICRODISCECTOMY Left 03/09/2020   Procedure: Left L5-S1 disectomy;  Surgeon: Melina Schools, MD;  Location: [facility];  Service: Orthopedics;  Laterality: Left;  2 hrs   TUBAL LIGATION     Social History   Occupational History    Comment: [employer]  Tobacco Use   Smoking status: Former    Years: 8.00    Pack years: 0.00    Types: Cigarettes    Quit date: 10/08/1987    Years since quitting: 33.5   Smokeless tobacco: Never  Vaping Use   Vaping Use: Never used  Substance and Sexual Activity   Alcohol use: Yes    Alcohol/week: 0.0 standard drinks    Comment: 1 glass of wine occasionally   Drug use: No   Sexual activity: Not on file

## 2021-04-12 ENCOUNTER — Ambulatory Visit: Payer: Self-pay | Admitting: Orthopedic Surgery

## 2021-05-02 ENCOUNTER — Encounter: Payer: Self-pay | Admitting: *Deleted

## 2021-05-07 ENCOUNTER — Telehealth: Payer: Self-pay | Admitting: Physician Assistant

## 2021-05-07 NOTE — Telephone Encounter (Signed)
Please see below. Could you please refer to different imaging location.

## 2021-05-07 NOTE — Telephone Encounter (Signed)
Pts ins does not cover MRI at [facility] imaging and needs to be referred to another facility for the MRI. The best call back number for the pt is [phone].

## 2021-05-08 NOTE — Telephone Encounter (Signed)
This has been taken care of already.

## 2021-05-24 ENCOUNTER — Other Ambulatory Visit: Payer: Self-pay

## 2021-05-24 ENCOUNTER — Ambulatory Visit (HOSPITAL_COMMUNITY)
Admission: RE | Admit: 2021-05-24 | Discharge: 2021-05-24 | Disposition: A | Discharge status: Home / Self Care | Payer: 59 | Source: Ambulatory Visit | Attending: Physician Assistant | Admitting: Physician Assistant

## 2021-05-24 DIAGNOSIS — M25561 Pain in right knee: Secondary | ICD-10-CM | POA: Diagnosis present

## 2021-06-21 ENCOUNTER — Encounter: Payer: Self-pay | Admitting: Orthopedic Surgery

## 2021-06-21 ENCOUNTER — Ambulatory Visit (INDEPENDENT_AMBULATORY_CARE_PROVIDER_SITE_OTHER): Payer: 59 | Admitting: Orthopedic Surgery

## 2021-06-21 ENCOUNTER — Other Ambulatory Visit: Payer: Self-pay

## 2021-06-21 DIAGNOSIS — M25561 Pain in right knee: Secondary | ICD-10-CM | POA: Diagnosis not present

## 2021-06-21 DIAGNOSIS — M1711 Unilateral primary osteoarthritis, right knee: Secondary | ICD-10-CM

## 2021-06-21 NOTE — Progress Notes (Signed)
Office Visit Note   Patient: Diane [name]           Date of Birth: 1955-11-21           MRN: [id number] Visit Date: 06/21/2021              Requested by: Shawnee Knapp, MD [place],  [place] [address] PCP: Shawnee Knapp, MD  Chief Complaint  Patient presents with   Right Knee - Follow-up    MRI review right knee       HPI: Patient is a 65 year old woman who presents for evaluation of her right knee.  Patient complains of pain and swelling over the medial joint line she has pain with activities of daily living with mechanical symptoms.  Assessment & Plan: Visit Diagnoses:  1. Acute pain of right knee   2. Unilateral primary osteoarthritis, right knee     Plan: Discussed that with the meniscal tearing and osteochondral defects she could benefit from arthroscopic debridement.  Discussed this has about a 50% chance of improving her symptoms.  Patient states she understands her current husband is currently in the ICU at [facility] long and she will call to set up arthroscopic surgery of the right knee at [facility] day surgery when her husband is home and stabilized.  Follow-Up Instructions: Return if symptoms worsen or fail to improve.   Ortho Exam  Patient is alert, oriented, no adenopathy, well-dressed, normal affect, normal respiratory effort. Examination patient does have an effusion of the right knee collaterals and cruciates are stable she is point tender to palpation over the medial joint line.  Review of the MRI scan shows tricompartmental arthritis with significant cartilage defect of the patellofemoral joint as well as the medial joint line there is degenerative tearing of the medial and lateral meniscus.  Imaging: No results found. No images are attached to the encounter.  Labs: Lab Results  Component Value Date   HGBA1C 5.5 03/05/2018   HGBA1C 5.7 (H) 05/08/2017   ESRSEDRATE CANCELED 04/30/2018   ESRSEDRATE 26 03/05/2018   ESRSEDRATE 7 01/12/2018   CRP  7.1 (H) 03/05/2018   CRP 2.7 07/10/2017   REPTSTATUS 02/17/2013 FINAL 02/16/2013   CULT INSIGNIFICANT GROWTH 02/16/2013   LABORGA NO GROWTH 01/27/2015     Lab Results  Component Value Date   ALBUMIN 4.2 06/18/2020   ALBUMIN 4.6 03/05/2018   ALBUMIN 4.5 07/10/2017    Lab Results  Component Value Date   MG 2.0 01/27/2015   Lab Results  Component Value Date   VD25OH 12.8 (L) 09/10/2018   VD25OH 11.4 (L) 01/12/2018   VD25OH 31.9 09/20/2016    No results found for: PREALBUMIN CBC EXTENDED Latest Ref Rng & Units 06/18/2020 06/18/2020 03/02/2020  WBC 4.0 - 10.5 K/uL 5.0 - 5.1  RBC 3.87 - 5.11 MIL/uL 4.82 - 4.70  HGB 12.0 - 15.0 g/dL 13.6 13.6 13.4  HCT 36.0 - 46.0 % 42.2 40.0 40.9  PLT 150 - 400 K/uL 373 - 321  NEUTROABS 1.7 - 7.7 K/uL 3.2 - -  LYMPHSABS 0.7 - 4.0 K/uL 1.3 - -     There is no height or weight on file to calculate BMI.  Orders:  No orders of the defined types were placed in this encounter.  No orders of the defined types were placed in this encounter.    Procedures: No procedures performed  Clinical Data: No additional findings.  ROS:  All other systems negative, except as noted  in the HPI. Review of Systems  Objective: Vital Signs: There were no vitals taken for this visit.  Specialty Comments:  No specialty comments available.  PMFS History: Patient Active Problem List   Diagnosis Date Noted   Severe obstructive sleep apnea-hypopnea syndrome 12/18/2020   Analgesic overuse headache 10/26/2020   Insomnia secondary to chronic pain 10/26/2020   Poor sleep pattern 10/26/2020   Chronic insomnia 10/26/2020   Overdose of analgesic 10/26/2020   Lumbar disc herniation 03/09/2020   Adrenal nodule ([place]) 09/27/2016   Fibromyalgia 02/01/2016   Arthritis, senescent 02/01/2016   Vitamin D deficiency 02/01/2016   Depression 08/31/2015   Leukopenia 08/31/2015   Polypharmacy 08/31/2015   Migraine headache without aura 12/31/2012   Hypertension  12/31/2012   Insomnia 12/31/2012   Past Medical History:  Diagnosis Date   Anxiety    Arthritis    Carpal tunnel syndrome on both sides    Complication of anesthesia    "can't stand mask over face." " It makes my head hurt"   Depression    Fibromyalgia    GERD (gastroesophageal reflux disease)    Headache(784.0)    Migraines   Hypertension    Multiple sites of bowel obstruction ([place]) 2005   Uterine fibroid     Family History  Problem Relation Age of Onset   Healthy Mother     Past Surgical History:  Procedure Laterality Date   CHOLECYSTECTOMY     COLON RESECTION  1990   endometrial curretage  2005   GANGLION CYST EXCISION Right 2001   hand   HERNIA REPAIR Left    inguinal   LUMBAR LAMINECTOMY/DECOMPRESSION MICRODISCECTOMY Left 03/09/2020   Procedure: Left L5-S1 disectomy;  Surgeon: Melina Schools, MD;  Location: [facility];  Service: Orthopedics;  Laterality: Left;  2 hrs   TUBAL LIGATION     Social History   Occupational History    Comment: [employer]  Tobacco Use   Smoking status: Former    Years: 8.00    Types: Cigarettes    Quit date: 10/08/1987    Years since quitting: 33.7   Smokeless tobacco: Never  Vaping Use   Vaping Use: Never used  Substance and Sexual Activity   Alcohol use: Yes    Alcohol/week: 0.0 standard drinks    Comment: 1 glass of wine occasionally   Drug use: No   Sexual activity: Not on file

## 2021-07-06 ENCOUNTER — Other Ambulatory Visit: Payer: Self-pay | Admitting: Physician Assistant

## 2021-11-05 ENCOUNTER — Telehealth: Payer: Self-pay | Admitting: Neurology

## 2021-11-05 NOTE — Telephone Encounter (Signed)
Does not appear we called her. She has not been seen since 10/26/20 (over a yr). She would need appt for follow up. Please call pt, thank you

## 2021-11-05 NOTE — Telephone Encounter (Signed)
Pt called in stating she had missed a call from [place] and had a voicemail with someone asking how she was doing with her sleep medication. She states she is doing fine and can be called back at [phone].

## 2021-12-26 ENCOUNTER — Ambulatory Visit (INDEPENDENT_AMBULATORY_CARE_PROVIDER_SITE_OTHER): Payer: 59 | Admitting: Family

## 2021-12-26 DIAGNOSIS — M1711 Unilateral primary osteoarthritis, right knee: Secondary | ICD-10-CM

## 2021-12-26 DIAGNOSIS — M25561 Pain in right knee: Secondary | ICD-10-CM

## 2021-12-26 DIAGNOSIS — M25562 Pain in left knee: Secondary | ICD-10-CM

## 2021-12-26 DIAGNOSIS — M1712 Unilateral primary osteoarthritis, left knee: Secondary | ICD-10-CM | POA: Diagnosis not present

## 2021-12-26 DIAGNOSIS — G8929 Other chronic pain: Secondary | ICD-10-CM

## 2021-12-26 MED ORDER — HYDROCODONE-ACETAMINOPHEN 10-325 MG PO TABS: 1.0000 | ORAL_TABLET | Freq: Four times a day (QID) | ORAL | 0 refills | Status: DC | PRN

## 2021-12-26 MED ORDER — OLMESARTAN MEDOXOMIL 20 MG PO TABS: 20.0000 mg | ORAL_TABLET | Freq: Every day | ORAL | 0 refills | Status: DC

## 2021-12-26 MED ORDER — AMLODIPINE BESYLATE 5 MG PO TABS: 5.0000 mg | ORAL_TABLET | Freq: Every day | ORAL | 0 refills | Status: DC

## 2021-12-26 NOTE — Progress Notes (Signed)
? ?Office Visit Note ?  ?Patient: Diane [name]           ?Date of Birth: [birth year]           ?MRN: [id number] ?Visit Date: 12/26/2021 ?             ?Requested by: Shawnee Knapp, MD ?[address] ?[place],  [place] [address] ?PCP: Shawnee Knapp, MD ? ?Chief Complaint  ?Patient presents with  ? Right Knee - Pain  ? Left Knee - Pain  ? ? ? ? ?HPI: ?The patient is a 66 year old woman who presents complaining of bilateral knee pain this is a chronic issue for her she is done quite well in the past with Depo-Medrol injections and her last injection was September of last year she presents today requesting repeat injection. ? ?She has been having pain with weightbearing intermittent swelling and some mechanical symptoms ? ?Assessment & Plan: ?Visit Diagnoses:  ?1. Chronic pain of both knees   ?2. Unilateral primary osteoarthritis, right knee   ?3. Primary osteoarthritis of left knee   ? ? ?Plan: Depo-Medrol injection bilateral knees patient tolerated well.  She will follow-up in the office as needed ? ?Follow-Up Instructions: Return in about 3 months (around 03/28/2022), or if symptoms worsen or fail to improve.  ? ?Right Knee Exam  ? ?Tenderness  ?The patient is experiencing tenderness in the medial joint line. ? ?Range of Motion  ?The patient has normal right knee ROM. ? ?Tests  ?Varus: negative Valgus: negative ? ?Other  ?Erythema: absent ?Swelling: none ? ? ?Left Knee Exam  ? ?Tenderness  ?The patient is experiencing tenderness in the medial joint line. ? ?Range of Motion  ?The patient has normal left knee ROM. ? ?Tests  ?Varus: negative Valgus: negative ? ?Other  ?Erythema: absent ?Swelling: none ? ? ? ? ?Patient is alert, oriented, no adenopathy, well-dressed, normal affect, normal respiratory effort. ? ? ?Imaging: ?No results found. ?No images are attached to the encounter. ? ?Labs: ?Lab Results  ?Component Value Date  ? HGBA1C 5.5 03/05/2018  ? HGBA1C 5.7 (H) 05/08/2017  ? ESRSEDRATE CANCELED 04/30/2018  ?  ESRSEDRATE 26 03/05/2018  ? ESRSEDRATE 7 01/12/2018  ? CRP 7.1 (H) 03/05/2018  ? CRP 2.7 07/10/2017  ? REPTSTATUS 02/17/2013 FINAL 02/16/2013  ? CULT INSIGNIFICANT GROWTH 02/16/2013  ? LABORGA NO GROWTH 01/27/2015  ? ? ? ?Lab Results  ?Component Value Date  ? ALBUMIN 4.2 06/18/2020  ? ALBUMIN 4.6 03/05/2018  ? ALBUMIN 4.5 07/10/2017  ? ? ?Lab Results  ?Component Value Date  ? MG 2.0 01/27/2015  ? ?Lab Results  ?Component Value Date  ? VD25OH 12.8 (L) 09/10/2018  ? VD25OH 11.4 (L) 01/12/2018  ? VD25OH 31.9 09/20/2016  ? ? ?No results found for: PREALBUMIN ? ?  Latest Ref Rng & Units 06/18/2020  ?  1:18 AM 06/18/2020  ? 12:45 AM 03/02/2020  ? 10:25 AM  ?CBC EXTENDED  ?WBC 4.0 - 10.5 K/uL 5.0    5.1    ?RBC 3.87 - 5.11 MIL/uL 4.82    4.70    ?Hemoglobin 12.0 - 15.0 g/dL 13.6   13.6   13.4    ?HCT 36.0 - 46.0 % 42.2   40.0   40.9    ?Platelets 150 - 400 K/uL 373    321    ?NEUT# 1.7 - 7.7 K/uL 3.2      ?Lymph# 0.7 - 4.0 K/uL 1.3      ? ? ? ?  There is no height or weight on file to calculate BMI. ? ?Orders:  ?Orders Placed This Encounter  ?Procedures  ? Large Joint Inj: bilateral knee  ? ?Meds ordered this encounter  ?Medications  ? HYDROcodone-acetaminophen (NORCO) 10-325 MG tablet  ?  Sig: Take 1 tablet by mouth every 6 (six) hours as needed.  ?  Dispense:  30 tablet  ?  Refill:  0  ? amLODipine (NORVASC) 5 MG tablet  ?  Sig: Take 1 tablet (5 mg total) by mouth daily.  ?  Dispense:  90 tablet  ?  Refill:  0  ? olmesartan (BENICAR) 20 MG tablet  ?  Sig: Take 1 tablet (20 mg total) by mouth daily.  ?  Dispense:  90 tablet  ?  Refill:  0  ? ? ? Procedures: ?Large Joint Inj: bilateral knee on 12/26/2021 10:22 AM ?Indications: pain ?Details: 18 G 1.5 in needle, anteromedial approach ?Medications (Right): 5 mL lidocaine 1 %; 40 mg methylPREDNISolone acetate 40 MG/ML ?Medications (Left): 5 mL lidocaine 1 %; 40 mg methylPREDNISolone acetate 40 MG/ML ?Consent was given by the patient.  ? ? ? ?Clinical Data: ?No additional  findings. ? ?ROS: ? ?All other systems negative, except as noted in the HPI. ?Review of Systems ? ?Objective: ?Vital Signs: There were no vitals taken for this visit. ? ?Specialty Comments:  ?No specialty comments available. ? ?PMFS History: ?Patient Active Problem List  ? Diagnosis Date Noted  ? Severe obstructive sleep apnea-hypopnea syndrome 12/18/2020  ? Analgesic overuse headache 10/26/2020  ? Insomnia secondary to chronic pain 10/26/2020  ? Poor sleep pattern 10/26/2020  ? Chronic insomnia 10/26/2020  ? Overdose of analgesic 10/26/2020  ? Lumbar disc herniation 03/09/2020  ? Adrenal nodule ([place]) 09/27/2016  ? Fibromyalgia 02/01/2016  ? Arthritis, senescent 02/01/2016  ? Vitamin D deficiency 02/01/2016  ? Depression 08/31/2015  ? Leukopenia 08/31/2015  ? Polypharmacy 08/31/2015  ? Migraine headache without aura 12/31/2012  ? Hypertension 12/31/2012  ? Insomnia 12/31/2012  ? ?Past Medical History:  ?Diagnosis Date  ? Anxiety   ? Arthritis   ? Carpal tunnel syndrome on both sides   ? Complication of anesthesia   ? "can't stand mask over face." " It makes my head hurt"  ? Depression   ? Fibromyalgia   ? GERD (gastroesophageal reflux disease)   ? Headache(784.0)   ? Migraines  ? Hypertension   ? Multiple sites of bowel obstruction ([place]) 2005  ? Uterine fibroid   ?  ?Family History  ?Problem Relation Age of Onset  ? Healthy Mother   ?  ?Past Surgical History:  ?Procedure Laterality Date  ? CHOLECYSTECTOMY    ? COLON RESECTION  1990  ? endometrial curretage  2005  ? GANGLION CYST EXCISION Right 2001  ? hand  ? HERNIA REPAIR Left   ? inguinal  ? LUMBAR LAMINECTOMY/DECOMPRESSION MICRODISCECTOMY Left 03/09/2020  ? Procedure: Left L5-S1 disectomy;  Surgeon: Melina Schools, MD;  Location: [facility];  Service: Orthopedics;  Laterality: Left;  2 hrs  ? TUBAL LIGATION    ? ?Social History  ? ?Occupational History  ?  Comment: [employer]  ?Tobacco Use  ? Smoking status: Former  ?  Years: 8.00  ?  Types: Cigarettes  ?  Quit date: 10/08/1987   ?  Years since quitting: 34.2  ? Smokeless tobacco: Never  ?Vaping Use  ? Vaping Use: Never used  ?Substance and Sexual Activity  ? Alcohol use: Yes  ?  Alcohol/week: 0.0  standard drinks  ?  Comment: 1 glass of wine occasionally  ? Drug use: No  ? Sexual activity: Not on file  ? ? ? ? ? ?

## 2021-12-28 MED ORDER — LIDOCAINE HCL 1 % IJ SOLN
5.0000 mL | INTRAMUSCULAR | Status: AC | PRN
  Administered 2021-12-26: 5 mL

## 2021-12-28 MED ORDER — METHYLPREDNISOLONE ACETATE 40 MG/ML IJ SUSP
40.0000 mg | INTRAMUSCULAR | Status: AC | PRN
  Administered 2021-12-26: 40 mg via INTRA_ARTICULAR

## 2022-01-31 ENCOUNTER — Telehealth: Payer: Self-pay | Admitting: Family

## 2022-01-31 NOTE — Telephone Encounter (Signed)
Patient called. She would like a refill on hydrocodone. Her call back number is [phone] ?

## 2022-02-01 NOTE — Telephone Encounter (Signed)
I called pt and advised that she was last in the office on 12/26/21 for bilat knee pain and was to come back if symptoms fail to improve. Can not write rx for hydrocodone would need office visit for eval. Appt sch 02/12/2022 ? ?She  also advised that her PCP has taken a leave of absence and wanted to know if you would refill her Cymbalta for her in the meanwhile.  ?

## 2022-02-06 MED ORDER — DULOXETINE HCL 60 MG PO CPEP: 60.0000 mg | ORAL_CAPSULE | Freq: Every day | ORAL | 1 refills | Status: DC

## 2022-02-06 NOTE — Addendum Note (Signed)
Addended by: Dondra Prader R on: 02/06/2022 04:31 PM ? ? Modules accepted: Orders ? ?

## 2022-02-12 ENCOUNTER — Ambulatory Visit: Payer: 59 | Admitting: Family

## 2022-02-12 ENCOUNTER — Encounter: Payer: Self-pay | Admitting: Family

## 2022-02-12 DIAGNOSIS — M170 Bilateral primary osteoarthritis of knee: Secondary | ICD-10-CM

## 2022-02-12 DIAGNOSIS — M5416 Radiculopathy, lumbar region: Secondary | ICD-10-CM

## 2022-02-12 MED ORDER — HYDROCODONE-ACETAMINOPHEN 10-325 MG PO TABS: 1.0000 | ORAL_TABLET | Freq: Three times a day (TID) | ORAL | 0 refills | Status: DC | PRN

## 2022-02-12 NOTE — Progress Notes (Signed)
? ?Office Visit Note ?  ?Patient: Diane [name]           ?Date of Birth: 1956/05/17           ?MRN: [id number] ?Visit Date: 02/12/2022 ?             ?Requested by: Shawnee Knapp, MD ?[address] ?[place],  [place] [address] ?PCP: Shawnee Knapp, MD ? ?Chief Complaint  ?Patient presents with  ? Right Knee - Follow-up  ?  S/p cortisone inj bilateral knees 12/26/21  ? Left Knee - Follow-up  ? ? ? ? ?HPI: ?The patient is a 66 year old woman who presents in follow-up for bilateral knee pain which is chronic.  She has a history of bilateral osteoarthritis.  She has been getting adequate relief with her cortisone injections her last injection was March 22 bilateral knees.  However she feels she cannot get by without her hydrocodone and presents today requesting a refill. ? ?Goes on to complain that her most distressing symptom is left-sided hip and back pain that radiates down the lateral aspect of her thigh the lateral knee lower leg and under the plantar aspect of her foot this is constant tooth ache type pain with shooting and burning ? ?Has a history of lumbar disc herniation she is being seen at [facility] for this.  She has had a series of epidural steroid injections which are not providing her with relief.  She relates she has had previous lumbar surgery as well ? ?No recent injuries no falls.  She is the primary caregiver for her husband and feels unable to have any surgical intervention that would take him away from his care ? ?Assessment & Plan: ?Visit Diagnoses:  ?1. Bilateral primary osteoarthritis of knee   ?2. Chronic left-sided lumbar radiculopathy   ? ? ?Plan: Discussed that we could repeat injection in 1 more month the patient voiced understanding discussed following up with her spinal surgeon for her lumbar radiculopathy. ? ? ?Follow-Up Instructions: No follow-ups on file.  ? ?Right Knee Exam  ? ?Tenderness  ?The patient is experiencing tenderness in the medial joint line. ? ?Range of Motion  ?The patient  has normal right knee ROM. ? ?Tests  ?Varus: negative Valgus: negative ? ?Other  ?Erythema: absent ?Swelling: none ? ? ?Left Knee Exam  ? ?Tenderness  ?The patient is experiencing tenderness in the medial joint line. ? ?Range of Motion  ?The patient has normal left knee ROM. ? ?Tests  ?Varus: negative Valgus: negative ? ?Other  ?Erythema: absent ?Swelling: none ? ? ?Back Exam  ? ?Tests  ?Straight leg raise left: positive ? ?Other  ?Gait: normal  ? ? ? ? ?Patient is alert, oriented, no adenopathy, well-dressed, normal affect, normal respiratory effort. ? ? ?Imaging: ?No results found. ?No images are attached to the encounter. ? ?Labs: ?Lab Results  ?Component Value Date  ? HGBA1C 5.5 03/05/2018  ? HGBA1C 5.7 (H) 05/08/2017  ? ESRSEDRATE CANCELED 04/30/2018  ? ESRSEDRATE 26 03/05/2018  ? ESRSEDRATE 7 01/12/2018  ? CRP 7.1 (H) 03/05/2018  ? CRP 2.7 07/10/2017  ? REPTSTATUS 02/17/2013 FINAL 02/16/2013  ? CULT INSIGNIFICANT GROWTH 02/16/2013  ? LABORGA NO GROWTH 01/27/2015  ? ? ? ?Lab Results  ?Component Value Date  ? ALBUMIN 4.2 06/18/2020  ? ALBUMIN 4.6 03/05/2018  ? ALBUMIN 4.5 07/10/2017  ? ? ?Lab Results  ?Component Value Date  ? MG 2.0 01/27/2015  ? ?Lab Results  ?Component Value Date  ? [place]  12.8 (L) 09/10/2018  ? VD25OH 11.4 (L) 01/12/2018  ? VD25OH 31.9 09/20/2016  ? ? ?No results found for: PREALBUMIN ? ?  Latest Ref Rng & Units 06/18/2020  ?  1:18 AM 06/18/2020  ? 12:45 AM 03/02/2020  ? 10:25 AM  ?CBC EXTENDED  ?WBC 4.0 - 10.5 K/uL 5.0    5.1    ?RBC 3.87 - 5.11 MIL/uL 4.82    4.70    ?Hemoglobin 12.0 - 15.0 g/dL 13.6   13.6   13.4    ?HCT 36.0 - 46.0 % 42.2   40.0   40.9    ?Platelets 150 - 400 K/uL 373    321    ?NEUT# 1.7 - 7.7 K/uL 3.2      ?Lymph# 0.7 - 4.0 K/uL 1.3      ? ? ? ?There is no height or weight on file to calculate BMI. ? ?Orders:  ?No orders of the defined types were placed in this encounter. ? ?Meds ordered this encounter  ?Medications  ? HYDROcodone-acetaminophen (NORCO) 10-325 MG tablet  ?   Sig: Take 1 tablet by mouth every 8 (eight) hours as needed.  ?  Dispense:  30 tablet  ?  Refill:  0  ? ? ? Procedures: ?No procedures performed ? ?Clinical Data: ?No additional findings. ? ?ROS: ? ?All other systems negative, except as noted in the HPI. ?Review of Systems  ?Constitutional:  Negative for chills and fever.  ?Musculoskeletal:  Positive for arthralgias, back pain and myalgias.  ? ?Objective: ?Vital Signs: There were no vitals taken for this visit. ? ?Specialty Comments:  ?No specialty comments available. ? ?PMFS History: ?Patient Active Problem List  ? Diagnosis Date Noted  ? Bilateral primary osteoarthritis of knee 02/12/2022  ? Severe obstructive sleep apnea-hypopnea syndrome 12/18/2020  ? Analgesic overuse headache 10/26/2020  ? Insomnia secondary to chronic pain 10/26/2020  ? Poor sleep pattern 10/26/2020  ? Chronic insomnia 10/26/2020  ? Overdose of analgesic 10/26/2020  ? Lumbar disc herniation 03/09/2020  ? Adrenal nodule ([place]) 09/27/2016  ? Fibromyalgia 02/01/2016  ? Arthritis, senescent 02/01/2016  ? Vitamin D deficiency 02/01/2016  ? Depression 08/31/2015  ? Leukopenia 08/31/2015  ? Polypharmacy 08/31/2015  ? Migraine headache without aura 12/31/2012  ? Hypertension 12/31/2012  ? Insomnia 12/31/2012  ? ?Past Medical History:  ?Diagnosis Date  ? Anxiety   ? Arthritis   ? Carpal tunnel syndrome on both sides   ? Complication of anesthesia   ? "can't stand mask over face." " It makes my head hurt"  ? Depression   ? Fibromyalgia   ? GERD (gastroesophageal reflux disease)   ? Headache(784.0)   ? Migraines  ? Hypertension   ? Multiple sites of bowel obstruction ([place]) 2005  ? Uterine fibroid   ?  ?Family History  ?Problem Relation Age of Onset  ? Healthy Mother   ?  ?Past Surgical History:  ?Procedure Laterality Date  ? CHOLECYSTECTOMY    ? COLON RESECTION  1990  ? endometrial curretage  2005  ? GANGLION CYST EXCISION Right 2001  ? hand  ? HERNIA REPAIR Left   ? inguinal  ? LUMBAR  LAMINECTOMY/DECOMPRESSION MICRODISCECTOMY Left 03/09/2020  ? Procedure: Left L5-S1 disectomy;  Surgeon: Melina Schools, MD;  Location: [facility];  Service: Orthopedics;  Laterality: Left;  2 hrs  ? TUBAL LIGATION    ? ?Social History  ? ?Occupational History  ?  Comment: [employer]  ?Tobacco Use  ? Smoking status: Former  ?  Years: 8.00  ?  Types: Cigarettes  ?  Quit date: 10/08/1987  ?  Years since quitting: 34.3  ? Smokeless tobacco: Never  ?Vaping Use  ? Vaping Use: Never used  ?Substance and Sexual Activity  ? Alcohol use: Yes  ?  Alcohol/week: 0.0 standard drinks  ?  Comment: 1 glass of wine occasionally  ? Drug use: No  ? Sexual activity: Not on file  ? ? ? ? ? ?

## 2022-03-23 ENCOUNTER — Other Ambulatory Visit: Payer: Self-pay | Admitting: Family

## 2022-03-26 ENCOUNTER — Ambulatory Visit: Payer: 59 | Admitting: Family

## 2022-03-26 ENCOUNTER — Encounter: Payer: Self-pay | Admitting: Family

## 2022-03-26 DIAGNOSIS — M170 Bilateral primary osteoarthritis of knee: Secondary | ICD-10-CM | POA: Diagnosis not present

## 2022-03-26 MED ORDER — LIDOCAINE HCL 1 % IJ SOLN
5.0000 mL | INTRAMUSCULAR | Status: AC | PRN
  Administered 2022-03-26: 5 mL

## 2022-03-26 MED ORDER — HYDROCODONE-ACETAMINOPHEN 10-325 MG PO TABS: 1.0000 | ORAL_TABLET | Freq: Three times a day (TID) | ORAL | 0 refills | Status: DC | PRN

## 2022-03-26 MED ORDER — METHYLPREDNISOLONE ACETATE 40 MG/ML IJ SUSP
40.0000 mg | INTRAMUSCULAR | Status: AC | PRN
  Administered 2022-03-26: 40 mg via INTRA_ARTICULAR

## 2022-03-26 NOTE — Progress Notes (Signed)
Office Visit Note   Patient: Diane [name]           Date of Birth: 24-Dec-1955           MRN: [id number] Visit Date: 03/26/2022              Requested by: Shawnee Knapp, MD [place],  [place] [address] PCP: Shawnee Knapp, MD  Chief Complaint  Patient presents with   Right Knee - Pain   Left Knee - Pain      HPI: The patient is a 66 year old woman who presents complaining of chronic bilateral knee pain, osteoarthritis.  Complains of intermittent swelling locking catching giving way denies any falls.  Global knee pain.  She presents today in routine follow-up requesting Depo-Medrol injections bilateral knees states that been working quite well for her.     Assessment & Plan: Visit Diagnoses:  No diagnosis found.   Plan: Depo-Medrol injection bilateral knees patient tolerated well.  She will follow-up in the office as needed  Follow-Up Instructions: No follow-ups on file.   Right Knee Exam   Tenderness  The patient is experiencing tenderness in the medial joint line.  Range of Motion  The patient has normal right knee ROM.  Tests  Varus: negative Valgus: negative  Other  Erythema: absent Swelling: none   Left Knee Exam   Tenderness  The patient is experiencing tenderness in the medial joint line.  Range of Motion  The patient has normal left knee ROM.  Tests  Varus: negative Valgus: negative  Other  Erythema: absent Swelling: none      Patient is alert, oriented, no adenopathy, well-dressed, normal affect, normal respiratory effort.   Imaging: No results found. No images are attached to the encounter.  Labs: Lab Results  Component Value Date   HGBA1C 5.5 03/05/2018   HGBA1C 5.7 (H) 05/08/2017   ESRSEDRATE CANCELED 04/30/2018   ESRSEDRATE 26 03/05/2018   ESRSEDRATE 7 01/12/2018   CRP 7.1 (H) 03/05/2018   CRP 2.7 07/10/2017   REPTSTATUS 02/17/2013 FINAL 02/16/2013   CULT INSIGNIFICANT GROWTH 02/16/2013   LABORGA NO  GROWTH 01/27/2015     Lab Results  Component Value Date   ALBUMIN 4.2 06/18/2020   ALBUMIN 4.6 03/05/2018   ALBUMIN 4.5 07/10/2017    Lab Results  Component Value Date   MG 2.0 01/27/2015   Lab Results  Component Value Date   VD25OH 12.8 (L) 09/10/2018   VD25OH 11.4 (L) 01/12/2018   VD25OH 31.9 09/20/2016    No results found for: "PREALBUMIN"    Latest Ref Rng & Units 06/18/2020    1:18 AM 06/18/2020   12:45 AM 03/02/2020   10:25 AM  CBC EXTENDED  WBC 4.0 - 10.5 K/uL 5.0   5.1   RBC 3.87 - 5.11 MIL/uL 4.82   4.70   Hemoglobin 12.0 - 15.0 g/dL 13.6  13.6  13.4   HCT 36.0 - 46.0 % 42.2  40.0  40.9   Platelets 150 - 400 K/uL 373   321   NEUT# 1.7 - 7.7 K/uL 3.2     Lymph# 0.7 - 4.0 K/uL 1.3        There is no height or weight on file to calculate BMI.  Orders:  No orders of the defined types were placed in this encounter.  Meds ordered this encounter  Medications   HYDROcodone-acetaminophen (NORCO) 10-325 MG tablet    Sig: Take 1 tablet by mouth every 8 (  eight) hours as needed.    Dispense:  42 tablet    Refill:  0     Procedures: Large Joint Inj: bilateral knee on 03/26/2022 10:05 AM Indications: pain Details: 18 G 1.5 in needle, anteromedial approach Medications (Right): 5 mL lidocaine 1 %; 40 mg methylPREDNISolone acetate 40 MG/ML Medications (Left): 5 mL lidocaine 1 %; 40 mg methylPREDNISolone acetate 40 MG/ML Consent was given by the patient.      Clinical Data: No additional findings.  ROS:  All other systems negative, except as noted in the HPI. Review of Systems  Objective: Vital Signs: There were no vitals taken for this visit.  Specialty Comments:  No specialty comments available.  PMFS History: Patient Active Problem List   Diagnosis Date Noted   Bilateral primary osteoarthritis of knee 02/12/2022   Severe obstructive sleep apnea-hypopnea syndrome 12/18/2020   Analgesic overuse headache 10/26/2020   Insomnia secondary to chronic  pain 10/26/2020   Poor sleep pattern 10/26/2020   Chronic insomnia 10/26/2020   Overdose of analgesic 10/26/2020   Lumbar disc herniation 03/09/2020   Adrenal nodule ([place]) 09/27/2016   Fibromyalgia 02/01/2016   Arthritis, senescent 02/01/2016   Vitamin D deficiency 02/01/2016   Depression 08/31/2015   Leukopenia 08/31/2015   Polypharmacy 08/31/2015   Migraine headache without aura 12/31/2012   Hypertension 12/31/2012   Insomnia 12/31/2012   Past Medical History:  Diagnosis Date   Anxiety    Arthritis    Carpal tunnel syndrome on both sides    Complication of anesthesia    "can't stand mask over face." " It makes my head hurt"   Depression    Fibromyalgia    GERD (gastroesophageal reflux disease)    Headache(784.0)    Migraines   Hypertension    Multiple sites of bowel obstruction ([place]) 2005   Uterine fibroid     Family History  Problem Relation Age of Onset   Healthy Mother     Past Surgical History:  Procedure Laterality Date   CHOLECYSTECTOMY     COLON RESECTION  1990   endometrial curretage  2005   GANGLION CYST EXCISION Right 2001   hand   HERNIA REPAIR Left    inguinal   LUMBAR LAMINECTOMY/DECOMPRESSION MICRODISCECTOMY Left 03/09/2020   Procedure: Left L5-S1 disectomy;  Surgeon: Melina Schools, MD;  Location: [facility];  Service: Orthopedics;  Laterality: Left;  2 hrs   TUBAL LIGATION     Social History   Occupational History    Comment: [employer]  Tobacco Use   Smoking status: Former    Years: 8.00    Types: Cigarettes    Quit date: 10/08/1987    Years since quitting: 34.4   Smokeless tobacco: Never  Vaping Use   Vaping Use: Never used  Substance and Sexual Activity   Alcohol use: Yes    Alcohol/week: 0.0 standard drinks of alcohol    Comment: 1 glass of wine occasionally   Drug use: No   Sexual activity: Not on file

## 2022-04-06 ENCOUNTER — Other Ambulatory Visit: Payer: Self-pay | Admitting: Family

## 2022-04-14 ENCOUNTER — Other Ambulatory Visit: Payer: Self-pay | Admitting: Family

## 2022-04-25 ENCOUNTER — Telehealth: Payer: Self-pay | Admitting: Orthopedic Surgery

## 2022-04-25 DIAGNOSIS — I10 Essential (primary) hypertension: Secondary | ICD-10-CM

## 2022-04-25 NOTE — Telephone Encounter (Signed)
Pt called stating PA Junie Panning stated she would call in her blood pressure medication because pt PCP is out on sick leave and none else can send it it. Pt is asking for her blood pressure meds to be sent to pharmacy. Please call pt about this matter at [phone].

## 2022-04-26 ENCOUNTER — Other Ambulatory Visit: Payer: Self-pay | Admitting: Family

## 2022-04-26 MED ORDER — OLMESARTAN MEDOXOMIL 20 MG PO TABS: 20.0000 mg | ORAL_TABLET | Freq: Every day | ORAL | 0 refills | Status: DC

## 2022-04-29 IMAGING — CR DG LUMBAR SPINE 1V
1 series · 1 of 1 positions shown · non-contrast
Comparison: 11/02/2019

CLINICAL DATA: Intraoperative localization

EXAM:
LUMBAR SPINE - 1 VIEW

[lateral]
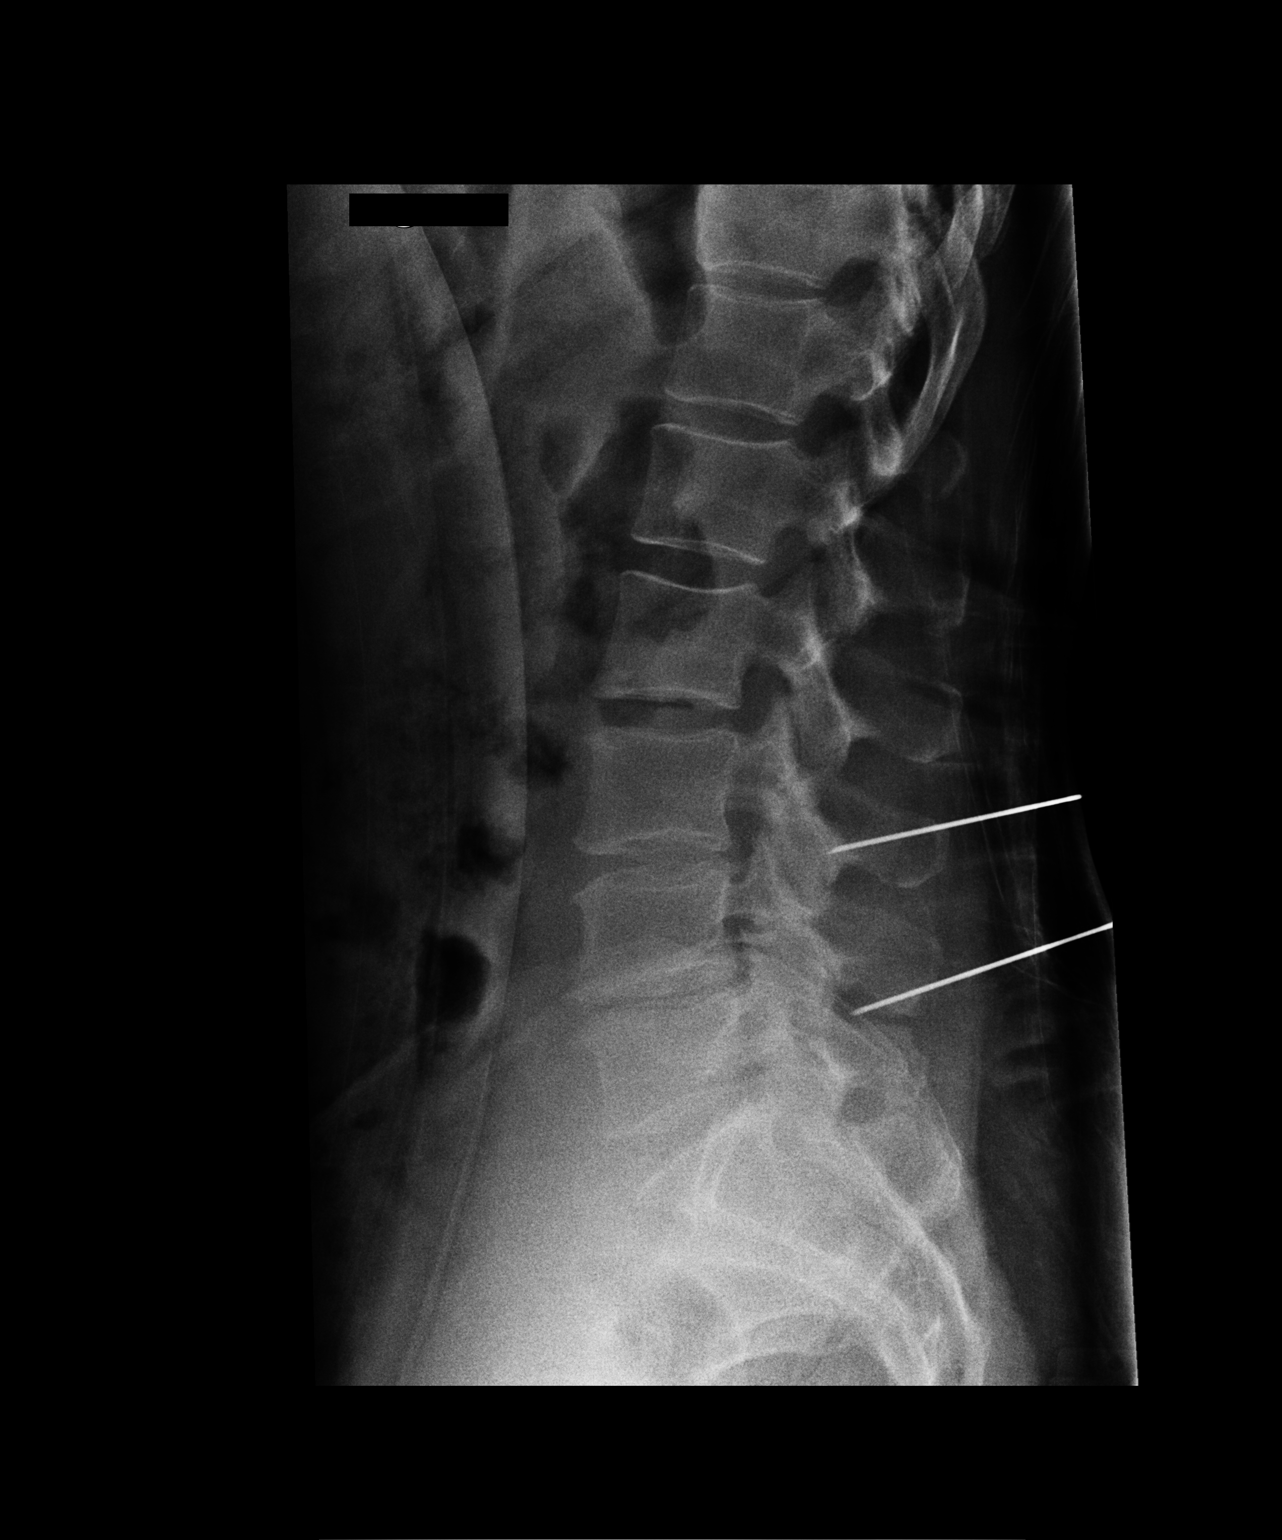

[1 of 1 positions shown; findings below may reference images not displayed]

FINDINGS: Lateral radiograph of the lumbar spine reveals needles within the
posterior soft tissues at the L3-4 level and posterior to the L5
vertebral body. Disc space narrowing is noted at L4-5 with
osteophytic change.
IMPRESSION: Intraoperative lumbar localization as described.

## 2022-04-30 ENCOUNTER — Telehealth: Payer: Self-pay | Admitting: Family

## 2022-04-30 NOTE — Telephone Encounter (Signed)
Patient called advised she need Rx Amlodipine called into her pharmacy. The number to contact patient is [phone]

## 2022-05-16 ENCOUNTER — Encounter: Payer: Self-pay | Admitting: Neurology

## 2022-05-16 NOTE — Telephone Encounter (Signed)
error 

## 2022-06-11 ENCOUNTER — Ambulatory Visit: Payer: 59 | Admitting: Neurology

## 2022-06-11 ENCOUNTER — Telehealth: Payer: Self-pay | Admitting: Neurology

## 2022-06-11 NOTE — Telephone Encounter (Signed)
Pt is called and stated she had covid and cancel appointment for today.

## 2022-06-11 NOTE — Telephone Encounter (Signed)
Noted  

## 2022-06-13 ENCOUNTER — Telehealth: Payer: Self-pay | Admitting: Orthopedic Surgery

## 2022-06-13 NOTE — Telephone Encounter (Signed)
Pt s/p bilateral knee injections 03/2021 asking for refill on pain medication last refill was in June

## 2022-06-13 NOTE — Telephone Encounter (Signed)
Pt called requesting a refill of hydrocodone. Please send to pharmacy on file. Pt phone number is [phone].

## 2022-06-14 NOTE — Telephone Encounter (Signed)
I called and lm on vm to advise of below. Happy to sch appt for her to call back if interested in getting injections.

## 2022-06-14 NOTE — Telephone Encounter (Signed)
No refill of narcotics for OA Can have repeat inj if she wants

## 2022-06-17 ENCOUNTER — Ambulatory Visit
Admission: EM | Admit: 2022-06-17 | Discharge: 2022-06-17 | Disposition: A | Discharge status: Home / Self Care | Payer: Medicare Other | Attending: Physician Assistant | Admitting: Physician Assistant

## 2022-06-17 DIAGNOSIS — R1032 Left lower quadrant pain: Secondary | ICD-10-CM | POA: Insufficient documentation

## 2022-06-17 LAB — POCT URINALYSIS DIP (MANUAL ENTRY)
Bilirubin, UA: NEGATIVE
Glucose, UA: NEGATIVE mg/dL
Ketones, POC UA: NEGATIVE mg/dL
Nitrite, UA: NEGATIVE
Protein Ur, POC: NEGATIVE mg/dL
Spec Grav, UA: 1.025 (ref 1.010–1.025)
Urobilinogen, UA: 1 E.U./dL
pH, UA: 6 (ref 5.0–8.0)

## 2022-06-17 NOTE — Discharge Instructions (Signed)
  [facility]  [address] [place], [place] [address]

## 2022-06-17 NOTE — ED Provider Notes (Signed)
[facility]    CSN: [id number] Arrival date & time: 06/17/22  1839      History   Chief Complaint Chief Complaint  Patient presents with   Abdominal Pain    HPI Diane [name] is a 66 y.o. female.   Patient here today for evaluation of lower abdominal pain that is been ongoing for the last month that seems to be worsening.  She reports that pain is does not to her low, suprapubic area.  She denies any constipation and last bowel movement was yesterday.  She denies any blood in her stool dark tarry stools.  She denies any diarrhea, vomiting but has had some nausea.  She has not had fever.  She has tried pain medication without significant relief  The history is provided by the patient.  Abdominal Pain Associated symptoms: nausea   Associated symptoms: no chills, no diarrhea, no fever, no shortness of breath and no vomiting     Past Medical History:  Diagnosis Date   Anxiety    Arthritis    Carpal tunnel syndrome on both sides    Complication of anesthesia    "can't stand mask over face." " It makes my head hurt"   Depression    Fibromyalgia    GERD (gastroesophageal reflux disease)    Headache(784.0)    Migraines   Hypertension    Multiple sites of bowel obstruction ([place]) 2005   Uterine fibroid     Patient Active Problem List   Diagnosis Date Noted   Bilateral primary osteoarthritis of knee 02/12/2022   Severe obstructive sleep apnea-hypopnea syndrome 12/18/2020   Analgesic overuse headache 10/26/2020   Insomnia secondary to chronic pain 10/26/2020   Poor sleep pattern 10/26/2020   Chronic insomnia 10/26/2020   Overdose of analgesic 10/26/2020   Lumbar disc herniation 03/09/2020   Adrenal nodule ([place]) 09/27/2016   Fibromyalgia 02/01/2016   Arthritis, senescent 02/01/2016   Vitamin D deficiency 02/01/2016   Depression 08/31/2015   Leukopenia 08/31/2015   Polypharmacy 08/31/2015   Migraine headache without aura 12/31/2012   Hypertension  12/31/2012   Insomnia 12/31/2012    Past Surgical History:  Procedure Laterality Date   CHOLECYSTECTOMY     COLON RESECTION  1990   endometrial curretage  2005   GANGLION CYST EXCISION Right 2001   hand   HERNIA REPAIR Left    inguinal   LUMBAR LAMINECTOMY/DECOMPRESSION MICRODISCECTOMY Left 03/09/2020   Procedure: Left L5-S1 disectomy;  Surgeon: Melina Schools, MD;  Location: [facility];  Service: Orthopedics;  Laterality: Left;  2 hrs   TUBAL LIGATION      OB History   No obstetric history on file.      Home Medications    Prior to Admission medications   Medication Sig Start Date End Date Taking? Authorizing Provider  amitriptyline (ELAVIL) 25 MG tablet Take 25 mg by mouth at bedtime.    [provider]  amLODipine (NORVASC) 5 MG tablet TAKE 1 TABLET (5 MG TOTAL) BY MOUTH DAILY. 05/01/22   Suzan Slick, NP  amLODipine-olmesartan (AZOR) 5-20 MG tablet Take 1 tablet by mouth daily. 01/27/20   [provider]  celecoxib (CELEBREX) 200 MG capsule Take 200 mg by mouth 2 (two) times daily. 06/06/20   [provider]  cyclobenzaprine (FLEXERIL) 10 MG tablet  10/18/20   [provider]  DULoxetine (CYMBALTA) 60 MG capsule Take 1 capsule (60 mg total) by mouth daily. 02/06/22   Suzan Slick, NP  [facility]  120 MG/ML SOAJ Inject 120 mg into the skin every 30 (thirty) days. 01/03/20   [provider]  gabapentin (NEURONTIN) 300 MG capsule TAKE 2 CAPSULES (600 MG TOTAL) BY MOUTH 3 (THREE) TIMES DAILY. Patient taking differently: Take 300-600 mg by mouth See admin instructions. Take 300 mg by mouth in the morning and evening and 600 mg at bedtime 12/22/18   Delia Chimes A, MD  HYDROcodone-acetaminophen (NORCO) 10-325 MG tablet Take 1 tablet by mouth every 8 (eight) hours as needed. 03/26/22   Suzan Slick, NP  olmesartan (BENICAR) 20 MG tablet Take 1 tablet (20 mg total) by mouth daily. 04/26/22   Suzan Slick, NP  omeprazole (PRILOSEC) 20 MG capsule  Take 20 mg by mouth daily. 05/26/20   [provider]  predniSONE (DELTASONE) 10 MG tablet Take 1 tablet (10 mg total) by mouth daily with breakfast. 04/05/21   Persons, Bevely Palmer, PA  SUMAtriptan (IMITREX) 100 MG tablet Take 100 mg by mouth every 2 (two) hours as needed for migraine. May repeat in 2 hours if headache persists or recurs.    [provider]    Family History Family History  Problem Relation Age of Onset   Healthy Mother     Social History Social History   Tobacco Use   Smoking status: Former    Years: 8.00    Types: Cigarettes    Quit date: 10/08/1987    Years since quitting: 34.7   Smokeless tobacco: Never  Vaping Use   Vaping Use: Never used  Substance Use Topics   Alcohol use: Yes    Alcohol/week: 0.0 standard drinks of alcohol    Comment: 1 glass of wine occasionally   Drug use: No     Allergies   Topamax [topiramate] and Dilaudid [hydromorphone hcl]   Review of Systems Review of Systems  Constitutional:  Negative for chills and fever.  Eyes:  Negative for discharge and redness.  Respiratory:  Negative for shortness of breath.   Gastrointestinal:  Positive for abdominal pain and nausea. Negative for blood in stool, diarrhea and vomiting.     Physical Exam Triage Vital Signs ED Triage Vitals [06/17/22 1901]  Enc Vitals Group     BP (!) 180/72     Pulse Rate 78     Resp 18     Temp 97.8 F (36.6 C)     Temp Source Oral     SpO2 98 %     Weight      Height      Head Circumference      Peak Flow      Pain Score 10     Pain Loc      Pain Edu?      Excl. in [place]?    No data found.  Updated Vital Signs BP (!) 180/72 (BP Location: Right Arm)   Pulse 78   Temp 97.8 F (36.6 C) (Oral)   Resp 18   SpO2 98%      Physical Exam Vitals and nursing note reviewed.  Constitutional:      General: She is not in acute distress.    Appearance: She is well-developed. She is not ill-appearing.  HENT:     Head: Normocephalic and  atraumatic.  Cardiovascular:     Rate and Rhythm: Normal rate and regular rhythm.  Pulmonary:     Effort: Pulmonary effort is normal. No respiratory distress.     Breath sounds: Normal breath sounds. No wheezing, rhonchi  or rales.  Abdominal:     General: Abdomen is flat. Bowel sounds are normal. There is no distension.     Tenderness: There is abdominal tenderness (lower pubic area). There is no guarding or rebound.  Neurological:     Mental Status: She is alert.  Psychiatric:        Mood and Affect: Mood normal.        Behavior: Behavior normal.      UC Treatments / Results  Labs (all labs ordered are listed, but only abnormal results are displayed) Labs Reviewed  POCT URINALYSIS DIP (MANUAL ENTRY) - Abnormal; Notable for the following components:      Result Value   Blood, UA trace-intact (*)    Leukocytes, UA Trace (*)    All other components within normal limits  URINE CULTURE    EKG   Radiology No results found.  Procedures Procedures (including critical care time)  Medications Ordered in UC Medications - No data to display  Initial Impression / Assessment and Plan / UC Course  I have reviewed the triage vital signs and the nursing notes.  Pertinent labs & imaging results that were available during my care of the patient were reviewed by me and considered in my medical decision making (see chart for details).   UA without significant signs of UTI, more concerned for other etiology and recommended further evaluation in the emergency room for imaging and labs.  Patient expresses understanding.   Final Clinical Impressions(s) / UC Diagnoses   Final diagnoses:  Abdominal pain, left lower quadrant     Discharge Instructions       [facility]  [address] [place], [place] [address]     ED Prescriptions   None    PDMP not reviewed this encounter.   Francene Finders, PA-C 06/18/22 [phone]

## 2022-06-17 NOTE — ED Triage Notes (Signed)
Pt c/o abd pain x 1 month. Denies nausea, vomiting, diarrhea constipation. Tried hydrocodone and oxycodone without relief. Lbm today.

## 2022-06-18 ENCOUNTER — Emergency Department (HOSPITAL_BASED_OUTPATIENT_CLINIC_OR_DEPARTMENT_OTHER): Payer: Medicare Other

## 2022-06-18 ENCOUNTER — Encounter (HOSPITAL_BASED_OUTPATIENT_CLINIC_OR_DEPARTMENT_OTHER): Payer: Self-pay | Admitting: Emergency Medicine

## 2022-06-18 ENCOUNTER — Emergency Department (HOSPITAL_BASED_OUTPATIENT_CLINIC_OR_DEPARTMENT_OTHER)
Admission: EM | Admit: 2022-06-18 | Discharge: 2022-06-18 | Disposition: A | Discharge status: Home / Self Care | Payer: Medicare Other | Attending: Emergency Medicine | Admitting: Emergency Medicine

## 2022-06-18 ENCOUNTER — Other Ambulatory Visit (HOSPITAL_BASED_OUTPATIENT_CLINIC_OR_DEPARTMENT_OTHER): Payer: Self-pay

## 2022-06-18 ENCOUNTER — Encounter: Payer: Self-pay | Admitting: Physician Assistant

## 2022-06-18 ENCOUNTER — Other Ambulatory Visit: Payer: Self-pay

## 2022-06-18 ENCOUNTER — Encounter (HOSPITAL_BASED_OUTPATIENT_CLINIC_OR_DEPARTMENT_OTHER): Payer: Self-pay

## 2022-06-18 DIAGNOSIS — R102 Pelvic and perineal pain: Secondary | ICD-10-CM | POA: Diagnosis present

## 2022-06-18 DIAGNOSIS — R110 Nausea: Secondary | ICD-10-CM | POA: Insufficient documentation

## 2022-06-18 DIAGNOSIS — D72819 Decreased white blood cell count, unspecified: Secondary | ICD-10-CM | POA: Insufficient documentation

## 2022-06-18 DIAGNOSIS — D259 Leiomyoma of uterus, unspecified: Secondary | ICD-10-CM | POA: Insufficient documentation

## 2022-06-18 DIAGNOSIS — I700 Atherosclerosis of aorta: Secondary | ICD-10-CM | POA: Diagnosis not present

## 2022-06-18 LAB — URINALYSIS, ROUTINE W REFLEX MICROSCOPIC
Bilirubin Urine: NEGATIVE
Glucose, UA: NEGATIVE mg/dL
Hgb urine dipstick: NEGATIVE
Ketones, ur: NEGATIVE mg/dL
Leukocytes,Ua: NEGATIVE
Nitrite: NEGATIVE
Protein, ur: NEGATIVE mg/dL
Specific Gravity, Urine: 1.008 (ref 1.005–1.030)
pH: 6.5 (ref 5.0–8.0)

## 2022-06-18 LAB — LIPASE, BLOOD: Lipase: <10 U/L — ABNORMAL LOW (ref 11–51)

## 2022-06-18 LAB — CBC WITH DIFFERENTIAL/PLATELET
Abs Immature Granulocytes: 0 10*3/uL (ref 0.00–0.07)
Basophils Absolute: 0 10*3/uL (ref 0.0–0.1)
Basophils Relative: 1 %
Eosinophils Absolute: 0.1 10*3/uL (ref 0.0–0.5)
Eosinophils Relative: 2 %
HCT: 35.9 % — ABNORMAL LOW (ref 36.0–46.0)
Hemoglobin: 12 g/dL (ref 12.0–15.0)
Immature Granulocytes: 0 %
Lymphocytes Relative: 41 %
Lymphs Abs: 1.1 10*3/uL (ref 0.7–4.0)
MCH: 28.1 pg (ref 26.0–34.0)
MCHC: 33.4 g/dL (ref 30.0–36.0)
MCV: 84.1 fL (ref 80.0–100.0)
Monocytes Absolute: 0.3 10*3/uL (ref 0.1–1.0)
Monocytes Relative: 11 %
Neutro Abs: 1.2 10*3/uL — ABNORMAL LOW (ref 1.7–7.7)
Neutrophils Relative %: 45 %
Platelets: 279 10*3/uL (ref 150–400)
RBC: 4.27 MIL/uL (ref 3.87–5.11)
RDW: 13 % (ref 11.5–15.5)
WBC: 2.6 10*3/uL — ABNORMAL LOW (ref 4.0–10.5)
nRBC: 0 % (ref 0.0–0.2)

## 2022-06-18 LAB — COMPREHENSIVE METABOLIC PANEL
ALT: 6 U/L (ref 0–44)
AST: 15 U/L (ref 15–41)
Albumin: 4.3 g/dL (ref 3.5–5.0)
Alkaline Phosphatase: 75 U/L (ref 38–126)
Anion gap: 10 (ref 5–15)
BUN: 12 mg/dL (ref 8–23)
CO2: 27 mmol/L (ref 22–32)
Calcium: 9 mg/dL (ref 8.9–10.3)
Chloride: 106 mmol/L (ref 98–111)
Creatinine, Ser: 0.63 mg/dL (ref 0.44–1.00)
GFR, Estimated: >60 mL/min (ref >60)
Glucose, Bld: 99 mg/dL (ref 70–99)
Potassium: 3.6 mmol/L (ref 3.5–5.1)
Sodium: 143 mmol/L (ref 135–145)
Total Bilirubin: 0.4 mg/dL (ref 0.3–1.2)
Total Protein: 6.8 g/dL (ref 6.5–8.1)

## 2022-06-18 MED ORDER — ONDANSETRON HCL 4 MG/2ML IJ SOLN: 4.0000 mg | Freq: Three times a day (TID) | INTRAMUSCULAR | Status: DC | PRN

## 2022-06-18 MED ORDER — IOHEXOL 300 MG/ML  SOLN
100.0000 mL | Freq: Once | INTRAMUSCULAR | Status: AC | PRN
  Administered 2022-06-18: 85 mL via INTRAVENOUS

## 2022-06-18 MED ORDER — MORPHINE SULFATE (PF) 4 MG/ML IV SOLN
4.0000 mg | Freq: Once | INTRAVENOUS | Status: AC
  Administered 2022-06-18: 4 mg via INTRAVENOUS
  Filled 2022-06-18: qty 1

## 2022-06-18 MED ORDER — SODIUM CHLORIDE 0.9 % IV BOLUS
1000.0000 mL | Freq: Once | INTRAVENOUS | Status: AC
  Administered 2022-06-18: 1000 mL via INTRAVENOUS

## 2022-06-18 MED ORDER — SODIUM CHLORIDE 0.9 % IV SOLN: INTRAVENOUS | Status: DC

## 2022-06-18 MED ORDER — NAPROXEN 500 MG PO TABS
500.0000 mg | ORAL_TABLET | Freq: Two times a day (BID) | ORAL | 0 refills | Status: DC
  Filled 2022-06-18: qty 30, 15d supply, fill #0

## 2022-06-18 NOTE — ED Provider Notes (Signed)
[facility]Y DEPT Provider Note   CSN: [id number] Arrival date & time: 06/18/22  4967     History  Chief Complaint  Patient presents with   Abdominal Pain    Diane [name] is a 66 y.o. female.   Abdominal Pain Associated symptoms: nausea      66 year old female with medical history significant for uterine fibroids, history of bowel obstruction, fibromyalgia, anxiety, depression who presents to the emergency department with 1 month of lower abdominal/pelvic pain.  She was seen in urgent care for her complaints and was advised to present to the emergency department for further evaluation.  She states that she is passing gas.  She denies any vaginal bleeding or abnormal vaginal discharge.  She rates the pain as 7 out of 10, sharp in the left lower quadrant of her abdomen with no radiation.  She denies any fevers or chills.  She endorses mild nausea, denies any vomiting.  She is tolerating oral intake.  Her last bowel movement was this morning.  She states that she is currently passing gas.  She does have a history of bowel obstruction.  She presents to the emergency department for evaluation today because "I cannot take the pain anymore."  Home Medications Prior to Admission medications   Medication Sig Start Date End Date Taking? Authorizing Provider  naproxen (NAPROSYN) 500 MG tablet Take 1 tablet (500 mg total) by mouth 2 (two) times daily. 06/18/22  Yes Regan Lemming, MD  amitriptyline (ELAVIL) 25 MG tablet Take 25 mg by mouth at bedtime.    [provider]  amLODipine (NORVASC) 5 MG tablet TAKE 1 TABLET (5 MG TOTAL) BY MOUTH DAILY. 05/01/22   Suzan Slick, NP  amLODipine-olmesartan (AZOR) 5-20 MG tablet Take 1 tablet by mouth daily. 01/27/20   [provider]  celecoxib (CELEBREX) 200 MG capsule Take 200 mg by mouth 2 (two) times daily. 06/06/20   [provider]  cyclobenzaprine (FLEXERIL) 10 MG tablet  10/18/20   [provider]  DULoxetine (CYMBALTA) 60 MG capsule Take 1 capsule (60 mg total) by mouth daily. 02/06/22   Suzan Slick, NP  EMGALITY 120 MG/ML SOAJ Inject 120 mg into the skin every 30 (thirty) days. 01/03/20   [provider]  gabapentin (NEURONTIN) 300 MG capsule TAKE 2 CAPSULES (600 MG TOTAL) BY MOUTH 3 (THREE) TIMES DAILY. Patient taking differently: Take 300-600 mg by mouth See admin instructions. Take 300 mg by mouth in the morning and evening and 600 mg at bedtime 12/22/18   Delia Chimes A, MD  HYDROcodone-acetaminophen (NORCO) 10-325 MG tablet Take 1 tablet by mouth every 8 (eight) hours as needed. 03/26/22   Suzan Slick, NP  olmesartan (BENICAR) 20 MG tablet Take 1 tablet (20 mg total) by mouth daily. 04/26/22   Suzan Slick, NP  omeprazole (PRILOSEC) 20 MG capsule Take 20 mg by mouth daily. 05/26/20   [provider]  predniSONE (DELTASONE) 10 MG tablet Take 1 tablet (10 mg total) by mouth daily with breakfast. 04/05/21   Persons, Bevely Palmer, PA  SUMAtriptan (IMITREX) 100 MG tablet Take 100 mg by mouth every 2 (two) hours as needed for migraine. May repeat in 2 hours if headache persists or recurs.    [provider]      Allergies    Topamax [topiramate] and Dilaudid [hydromorphone hcl]    Review of Systems   Review of Systems  Gastrointestinal:  Positive for abdominal pain and nausea.  All other systems reviewed and are negative.   Physical Exam Updated Vital Signs BP 132/83   Pulse 60   Temp 98.4 F (36.9 C)   Resp 18   Ht '5\' 3"'$  (1.6 m)   Wt 74.8 kg   SpO2 96%   BMI 29.23 kg/m  Physical Exam Vitals and nursing note reviewed.  Constitutional:      General: She is not in acute distress.    Appearance: She is well-developed.  HENT:     Head: Normocephalic and atraumatic.  Eyes:     Conjunctiva/sclera: Conjunctivae normal.  Cardiovascular:     Rate and Rhythm: Normal rate and regular rhythm.  Pulmonary:     Effort: Pulmonary effort is  normal. No respiratory distress.     Breath sounds: Normal breath sounds.  Abdominal:     Palpations: Abdomen is soft.     Tenderness: There is abdominal tenderness in the left lower quadrant. There is no guarding or rebound.  Musculoskeletal:        General: No swelling.     Cervical back: Neck supple.  Skin:    General: Skin is warm and dry.     Capillary Refill: Capillary refill takes less than 2 seconds.  Neurological:     Mental Status: She is alert.  Psychiatric:        Mood and Affect: Mood normal.     ED Results / Procedures / Treatments   Labs (all labs ordered are listed, but only abnormal results are displayed) Labs Reviewed  LIPASE, BLOOD - Abnormal; Notable for the following components:      Result Value   Lipase <10 (*)    All other components within normal limits  CBC WITH DIFFERENTIAL/PLATELET - Abnormal; Notable for the following components:   WBC 2.6 (*)    HCT 35.9 (*)    Neutro Abs 1.2 (*)    All other components within normal limits  URINALYSIS, ROUTINE W REFLEX MICROSCOPIC - Abnormal; Notable for the following components:   Color, Urine COLORLESS (*)    All other components within normal limits  COMPREHENSIVE METABOLIC PANEL    EKG None  Radiology US PELVIC COMPLETE WITH TRANSVAGINAL  Result Date: 06/18/2022 CLINICAL DATA:  Pelvic pain EXAM: TRANSABDOMINAL AND TRANSVAGINAL ULTRASOUND OF PELVIS TECHNIQUE: Both transabdominal and transvaginal ultrasound examinations of the pelvis were performed. Transabdominal technique was performed for global imaging of the pelvis including uterus, ovaries, adnexal regions, and pelvic cul-de-sac. It was necessary to proceed with endovaginal exam following the transabdominal exam to visualize the uterus/endometrium and ovaries/adnexa. COMPARISON:  Same day CT FINDINGS: Uterus Measurements: 8.4 x 3.7 x 4.8 cm = volume: 77.6 mL. There are multiple uterine fibroids some with calcification as seen on recent CT, measuring  2.5 x 2.2 x 2.4 cm, 1.1 x 1.0 x 1.1 cm, and 1.8 x 1.3 x 1.6 cm. Endometrium Thickness: 5.4 mm, borderline thickened. Right ovary Measurements: 2.1 x 2.4 x 1.9 cm = volume: 5.1 mL. Normal appearance/no adnexal mass. Left ovary Measurements: 3.2 x 1.2 x 2.8 cm = volume: 5.5 mL. Normal appearance/no adnexal mass. Other findings No abnormal free fluid. IMPRESSION: Multiple uterine fibroids, largest measures up to 2.5 cm, as seen on recent CT. Borderline thickened endometrium at 5.4 mm, correlate with any history of abnormal uterine bleeding and recommend follow-up with OBGYN. Normal ovaries. Electronically Signed   By: Maurine Simmering M.D.   On: 06/18/2022 10:16   CT ABDOMEN PELVIS W CONTRAST  Result Date: 06/18/2022 CLINICAL  DATA:  Left lower quadrant abdominal pain for the past month. EXAM: CT ABDOMEN AND PELVIS WITH CONTRAST TECHNIQUE: Multidetector CT imaging of the abdomen and pelvis was performed using the standard protocol following bolus administration of intravenous contrast. RADIATION DOSE REDUCTION: This exam was performed according to the departmental dose-optimization program which includes automated exposure control, adjustment of the mA and/or kV according to patient size and/or use of iterative reconstruction technique. CONTRAST:  24m OMNIPAQUE IOHEXOL 300 MG/ML  SOLN COMPARISON:  CT abdomen pelvis dated September 27, 2016. FINDINGS: Lower chest: No acute abnormality. Hepatobiliary: Unchanged small hepatic cysts in the left lobe. No new focal liver abnormality. Unchanged intra- and extrahepatic biliary dilatation status post cholecystectomy. Pancreas: Unremarkable. No pancreatic ductal dilatation or surrounding inflammatory changes. Spleen: Normal in size without focal abnormality. Adrenals/Urinary Tract: Unchanged 2.0 cm left adrenal nodule, stable since 2017, consistent with adenoma. No follow-up imaging is recommended. The right adrenal gland is unremarkable. Kidneys are normal without solid lesion,  calculi, or hydronephrosis. Bladder is unremarkable. Stomach/Bowel: Stomach is within normal limits. Diminutive or absent appendix. No evidence of bowel wall thickening, distention, or inflammatory changes. Mild diffusely increased colonic stool burden. Vascular/Lymphatic: Aortic atherosclerosis. No enlarged abdominal or pelvic lymph nodes. Reproductive: Few small calcified uterine fibroids. No adnexal mass. Other: No free fluid or pneumoperitoneum. Musculoskeletal: No acute or significant osseous findings. Old fracture deformity of the pubic symphysis and right inferior pubic ramus. IMPRESSION: 1. No acute intra-abdominal process. 2. Mild diffusely increased colonic stool burden. Correlate for constipation. 3. Aortic Atherosclerosis (ICD10-I70.0). Electronically Signed   By: WTitus DubinM.D.   On: 06/18/2022 09:29    Procedures Procedures    Medications Ordered in ED Medications  sodium chloride 0.9 % bolus 1,000 mL (0 mLs Intravenous Stopped 06/18/22 1013)    And  0.9 %  sodium chloride infusion ( Intravenous New Bag/Given 06/18/22 1015)  ondansetron (ZOFRAN) injection 4 mg (has no administration in time range)  morphine (PF) 4 MG/ML injection 4 mg (4 mg Intravenous Given 06/18/22 0809)  iohexol (OMNIPAQUE) 300 MG/ML solution 100 mL (85 mLs Intravenous Contrast Given 06/18/22 0913)    ED Course/ Medical Decision Making/ A&P                           Medical Decision Making Amount and/or Complexity of Data Reviewed Labs: ordered. Radiology: ordered.  Risk Prescription drug management.    66year old female with medical history significant for uterine fibroids, history of bowel obstruction, fibromyalgia, anxiety, depression who presents to the emergency department with 1 month of lower abdominal/pelvic pain.  She was seen in urgent care for her complaints and was advised to present to the emergency department for further evaluation.  She states that she is passing gas.  She denies any  vaginal bleeding or abnormal vaginal discharge.  She rates the pain as 7 out of 10, sharp in the left lower quadrant of her abdomen with no radiation.  She denies any fevers or chills.  She endorses mild nausea, denies any vomiting.  She is tolerating oral intake.  Her last bowel movement was this morning.  She states that she is currently passing gas.  She does have a history of bowel obstruction.  She presents to the emergency department for evaluation today because "I cannot take the pain anymore."  On arrival, the patient was vitally stable, afebrile, not tachycardic, BP 148/91, saturating 98% on room air.  Physical exam significant for left  lower quadrant tenderness to palpation, no rebound or guarding.  Differential diagnosis includes diverticulitis, less likely SBO, considered pelvic etiology such as intermittent ovarian torsion, pain associated with large uterine fibroids, gynecological malignancy. Patient without vaginal bleeding or abnormal vaginal discharge.  After evaluation significant for CBC with a leukopenia to 2.6, no anemia, urinalysis without evidence of UTI or hematuria, lipase normal, CMP unremarkable.  The patient was administered an IV fluid bolus, IV morphine for pain control.   CT abdomen pelvis revealed the following: IMPRESSION:  1. No acute intra-abdominal process.  2. Mild diffusely increased colonic stool burden. Correlate for  constipation.  3. Aortic Atherosclerosis (ICD10-I70.0).   Patient is moving her bowels with a last bowel movement this morning, low concern for constipation as the etiology of her discomfort.  Pelvic ultrasound reviewed the following: IMPRESSION:  Multiple uterine fibroids, largest measures up to 2.5 cm, as seen on  recent CT.    Borderline thickened endometrium at 5.4 mm, correlate with any  history of abnormal uterine bleeding and recommend follow-up with  OBGYN.    Normal ovaries.   The patient's pelvic pain is likely due to her  multiple uterine fibroids.  I did recommend outpatient follow-up with an OB/GYN and a referral was placed.  Recommended NSAIDs for pain control.  Overall stable for discharge.   Final Clinical Impression(s) / ED Diagnoses Final diagnoses:  Uterine leiomyoma, unspecified location  Pelvic pain    Rx / DC Orders ED Discharge Orders          Ordered    Ambulatory referral to Gynecology        06/18/22 1036    naproxen (NAPROSYN) 500 MG tablet  2 times daily        06/18/22 1038              Regan Lemming, MD 06/18/22 1039

## 2022-06-18 NOTE — Discharge Instructions (Addendum)
Your CT abdomen pelvis was unremarkable, your pelvic ultrasound was concerning for multiple uterine fibroids which are likely the source of your discomfort.  Recommend outpatient follow-up with an OB/GYN.  [place]: IMPRESSION:  Multiple uterine fibroids, largest measures up to 2.5 cm, as seen on  recent CT.    Borderline thickened endometrium at 5.4 mm, correlate with any  history of abnormal uterine bleeding and recommend follow-up with  OBGYN.    Normal ovaries.

## 2022-06-18 NOTE — ED Triage Notes (Signed)
Pt sent from UC related to possible "fibroids tumors. Pt states the pain is chronic and has been going on for about a month.

## 2022-06-19 LAB — URINE CULTURE: Culture: NO GROWTH

## 2022-06-26 ENCOUNTER — Ambulatory Visit (INDEPENDENT_AMBULATORY_CARE_PROVIDER_SITE_OTHER): Payer: Medicare Other | Admitting: Family

## 2022-06-26 ENCOUNTER — Encounter: Payer: Self-pay | Admitting: Family

## 2022-06-26 ENCOUNTER — Other Ambulatory Visit: Payer: Self-pay | Admitting: Family

## 2022-06-26 DIAGNOSIS — M170 Bilateral primary osteoarthritis of knee: Secondary | ICD-10-CM | POA: Diagnosis not present

## 2022-06-26 DIAGNOSIS — M1712 Unilateral primary osteoarthritis, left knee: Secondary | ICD-10-CM

## 2022-06-26 DIAGNOSIS — M1711 Unilateral primary osteoarthritis, right knee: Secondary | ICD-10-CM | POA: Diagnosis not present

## 2022-06-26 MED ORDER — LIDOCAINE HCL 1 % IJ SOLN
5.0000 mL | INTRAMUSCULAR | Status: AC | PRN
  Administered 2022-06-26: 5 mL

## 2022-06-26 MED ORDER — METHYLPREDNISOLONE ACETATE 40 MG/ML IJ SUSP
40.0000 mg | INTRAMUSCULAR | Status: AC | PRN
  Administered 2022-06-26: 40 mg via INTRA_ARTICULAR

## 2022-06-26 MED ORDER — CYCLOBENZAPRINE HCL 10 MG PO TABS: 10.0000 mg | ORAL_TABLET | Freq: Three times a day (TID) | ORAL | 0 refills | Status: DC | PRN

## 2022-06-26 MED ORDER — HYDROCODONE-ACETAMINOPHEN 5-325 MG PO TABS: 1.0000 | ORAL_TABLET | Freq: Three times a day (TID) | ORAL | 0 refills | Status: DC | PRN

## 2022-06-26 NOTE — Addendum Note (Signed)
Addended by: Dondra Prader R on: 06/26/2022 10:53 AM   Modules accepted: Orders

## 2022-06-26 NOTE — Progress Notes (Signed)
Office Visit Note   Patient: Diane [name]           Date of Birth: 1956/06/05           MRN: [id number] Visit Date: 06/26/2022              Requested by: Shawnee Knapp, MD [place],  [place] [id number] PCP: Shawnee Knapp, MD  No chief complaint on file.     HPI: The patient is a 66 year old woman who presents today in follow-up for ongoing chronic bilateral knee pain.  History of osteoarthritis bilateral knees.  She does have mechanical symptoms with swelling locking catching and giving way.  She has not had any associated falls.  Requesting repeat Depo-Medrol injection bilateral knees today.  Assessment & Plan: Visit Diagnoses: No diagnosis found.  Plan: Depo-Medrol injection bilateral knees.  Patient tolerated well.  Follow-Up Instructions: Return in about 3 months (around 09/25/2022), or if symptoms worsen or fail to improve.   Right Knee Exam   Muscle Strength  The patient has normal right knee strength.  Tenderness  The patient is experiencing tenderness in the medial joint line.  Range of Motion  The patient has normal right knee ROM.  Other  Effusion: no effusion present   Left Knee Exam   Muscle Strength  The patient has normal left knee strength.  Tenderness  The patient is experiencing tenderness in the medial joint line.  Range of Motion  The patient has normal left knee ROM.  Other  Effusion: no effusion present      Patient is alert, oriented, no adenopathy, well-dressed, normal affect, normal respiratory effort.   Imaging: No results found. No images are attached to the encounter.  Labs: Lab Results  Component Value Date   HGBA1C 5.5 03/05/2018   HGBA1C 5.7 (H) 05/08/2017   ESRSEDRATE CANCELED 04/30/2018   ESRSEDRATE 26 03/05/2018   ESRSEDRATE 7 01/12/2018   CRP 7.1 (H) 03/05/2018   CRP 2.7 07/10/2017   REPTSTATUS 06/19/2022 FINAL 06/17/2022   CULT  06/17/2022    NO GROWTH Performed at [facility],  [place] [address] [place], [place] [address]    LABORGA NO GROWTH 01/27/2015     Lab Results  Component Value Date   ALBUMIN 4.3 06/18/2022   ALBUMIN 4.2 06/18/2020   ALBUMIN 4.6 03/05/2018    Lab Results  Component Value Date   MG 2.0 01/27/2015   Lab Results  Component Value Date   VD25OH 12.8 (L) 09/10/2018   VD25OH 11.4 (L) 01/12/2018   VD25OH 31.9 09/20/2016    No results found for: "PREALBUMIN"    Latest Ref Rng & Units 06/18/2022    8:05 AM 06/18/2020    1:18 AM 06/18/2020   12:45 AM  CBC EXTENDED  WBC 4.0 - 10.5 K/uL 2.6  5.0    RBC 3.87 - 5.11 MIL/uL 4.27  4.82    Hemoglobin 12.0 - 15.0 g/dL 12.0  13.6  13.6   HCT 36.0 - 46.0 % 35.9  42.2  40.0   Platelets 150 - 400 K/uL 279  373    NEUT# 1.7 - 7.7 K/uL 1.2  3.2    Lymph# 0.7 - 4.0 K/uL 1.1  1.3       There is no height or weight on file to calculate BMI.  Orders:  No orders of the defined types were placed in this encounter.  No orders of the defined types were placed in this  encounter.    Procedures: Large Joint Inj: bilateral knee on 06/26/2022 10:46 AM Indications: pain Details: 18 G 1.5 in needle, anteromedial approach Medications (Right): 5 mL lidocaine 1 %; 40 mg methylPREDNISolone acetate 40 MG/ML Medications (Left): 5 mL lidocaine 1 %; 40 mg methylPREDNISolone acetate 40 MG/ML Consent was given by the patient.      Clinical Data: No additional findings.  ROS:  All other systems negative, except as noted in the HPI. Review of Systems  Objective: Vital Signs: There were no vitals taken for this visit.  Specialty Comments:  No specialty comments available.  PMFS History: Patient Active Problem List   Diagnosis Date Noted   Bilateral primary osteoarthritis of knee 02/12/2022   Severe obstructive sleep apnea-hypopnea syndrome 12/18/2020   Analgesic overuse headache 10/26/2020   Insomnia secondary to chronic pain 10/26/2020   Poor sleep pattern 10/26/2020   Chronic insomnia 10/26/2020    Overdose of analgesic 10/26/2020   Lumbar disc herniation 03/09/2020   Adrenal nodule ([place]) 09/27/2016   Fibromyalgia 02/01/2016   Arthritis, senescent 02/01/2016   Vitamin D deficiency 02/01/2016   Depression 08/31/2015   Leukopenia 08/31/2015   Polypharmacy 08/31/2015   Migraine headache without aura 12/31/2012   Hypertension 12/31/2012   Insomnia 12/31/2012   Past Medical History:  Diagnosis Date   Anxiety    Arthritis    Carpal tunnel syndrome on both sides    Complication of anesthesia    "can't stand mask over face." " It makes my head hurt"   Depression    Fibromyalgia    GERD (gastroesophageal reflux disease)    Headache(784.0)    Migraines   Hypertension    Multiple sites of bowel obstruction ([place]) 2005   Uterine fibroid     Family History  Problem Relation Age of Onset   Healthy Mother     Past Surgical History:  Procedure Laterality Date   CHOLECYSTECTOMY     COLON RESECTION  1990   endometrial curretage  2005   GANGLION CYST EXCISION Right 2001   hand   HERNIA REPAIR Left    inguinal   LUMBAR LAMINECTOMY/DECOMPRESSION MICRODISCECTOMY Left 03/09/2020   Procedure: Left L5-S1 disectomy;  Surgeon: Melina Schools, MD;  Location: [facility];  Service: Orthopedics;  Laterality: Left;  2 hrs   TUBAL LIGATION     Social History   Occupational History    Comment: [employer]  Tobacco Use   Smoking status: Former    Years: 8.00    Types: Cigarettes    Quit date: 10/08/1987    Years since quitting: 34.7   Smokeless tobacco: Never  Vaping Use   Vaping Use: Never used  Substance and Sexual Activity   Alcohol use: Yes    Alcohol/week: 0.0 standard drinks of alcohol    Comment: 1 glass of wine occasionally   Drug use: No   Sexual activity: Not on file

## 2022-07-18 ENCOUNTER — Encounter: Payer: Self-pay | Admitting: Neurology

## 2022-07-18 ENCOUNTER — Ambulatory Visit: Payer: Medicare Other | Admitting: Neurology

## 2022-07-18 VITALS — BP 145/89 | HR 74 | Ht 64.0 in | Wt 164.0 lb

## 2022-07-18 DIAGNOSIS — G4733 Obstructive sleep apnea (adult) (pediatric): Secondary | ICD-10-CM

## 2022-07-18 DIAGNOSIS — G8929 Other chronic pain: Secondary | ICD-10-CM

## 2022-07-18 DIAGNOSIS — F5104 Psychophysiologic insomnia: Secondary | ICD-10-CM

## 2022-07-18 DIAGNOSIS — G4701 Insomnia due to medical condition: Secondary | ICD-10-CM

## 2022-07-18 NOTE — Patient Instructions (Signed)
Healthy Living: Sleep In this video, you will learn why sleep is an important part of a healthy lifestyle. To view the content, go to this web address: [email or web address]  This video will expire on: 03/29/2024. If you need access to this video following this date, please reach out to the healthcare provider who assigned it to you. This information is not intended to replace advice given to you by your health care provider. Make sure you discuss any questions you have with your health care provider. Elsevier Patient Education  [place]. Sleep Apnea Sleep apnea affects breathing during sleep. It causes breathing to stop for 10 seconds or more, or to become shallow. People with sleep apnea usually snore loudly. It can also increase the risk of: Heart attack. Stroke. Being very overweight (obese). Diabetes. Heart failure. Irregular heartbeat. High blood pressure. The goal of treatment is to help you breathe normally again. What are the causes?  The most common cause of this condition is a collapsed or blocked airway. There are three kinds of sleep apnea: Obstructive sleep apnea. This is caused by a blocked or collapsed airway. Central sleep apnea. This happens when the brain does not send the right signals to the muscles that control breathing. Mixed sleep apnea. This is a combination of obstructive and central sleep apnea. What increases the risk? Being overweight. Smoking. Having a small airway. Being older. Being female. Drinking alcohol. Taking medicines to calm yourself (sedatives or tranquilizers). Having family members with the condition. Having a tongue or tonsils that are larger than normal. What are the signs or symptoms? Trouble staying asleep. Loud snoring. Headaches in the morning. Waking up gasping. Dry mouth or sore throat in the morning. Being sleepy or tired during the day. If you are sleepy or tired during the day, you may also: Not be  able to focus your mind (concentrate). Forget things. Get angry a lot and have mood swings. Feel sad (depressed). Have changes in your personality. Have less interest in sex, if you are female. Be unable to have an erection, if you are female. How is this treated?  Sleeping on your side. Using a medicine to get rid of mucus in your nose (decongestant). Avoiding the use of alcohol, medicines to help you relax, or certain pain medicines (narcotics). Losing weight, if needed. Changing your diet. Quitting smoking. Using a machine to open your airway while you sleep, such as: An oral appliance. This is a mouthpiece that shifts your lower jaw forward. A CPAP device. This device blows air through a mask when you breathe out (exhale). An EPAP device. This has valves that you put in each nostril. A BIPAP device. This device blows air through a mask when you breathe in (inhale) and breathe out. Having surgery if other treatments do not work. Follow these instructions at home: Lifestyle Make changes that your doctor recommends. Eat a healthy diet. Lose weight if needed. Avoid alcohol, medicines to help you relax, and some pain medicines. Do not smoke or use any products that contain nicotine or tobacco. If you need help quitting, ask your doctor. General instructions Take over-the-counter and prescription medicines only as told by your doctor. If you were given a machine to use while you sleep, use it only as told by your doctor. If you are having surgery, make sure to tell your doctor you have sleep apnea. You may need to bring your device with you. Keep all follow-up visits. Contact a doctor if: The machine that you  were given to use during sleep bothers you or does not seem to be working. You do not get better. You get worse. Get help right away if: Your chest hurts. You have trouble breathing in enough air. You have an uncomfortable feeling in your back, arms, or stomach. You have  trouble talking. One side of your body feels weak. A part of your face is hanging down. These symptoms may be an emergency. Get help right away. Call your local emergency services (911 in the [place] Do not wait to see if the symptoms will go away. Do not drive yourself to the hospital. Summary This condition affects breathing during sleep. The most common cause is a collapsed or blocked airway. The goal of treatment is to help you breathe normally while you sleep. This information is not intended to replace advice given to you by your health care provider. Make sure you discuss any questions you have with your health care provider. Document Revised: 05/02/2021 Document Reviewed: 09/01/2020 Elsevier Patient Education  [place]. CPAP and BIPAP Information CPAP and BIPAP are methods that use air pressure to keep your airways open and to help you breathe well. CPAP and BIPAP use different amounts of pressure. Your health care provider will tell you whether CPAP or BIPAP would be more helpful for you. CPAP stands for "continuous positive airway pressure." With CPAP, the amount of pressure stays the same while you breathe in (inhale) and out (exhale). BIPAP stands for "bi-level positive airway pressure." With BIPAP, the amount of pressure will be higher when you inhale and lower when you exhale. This allows you to take larger breaths. CPAP or BIPAP may be used in the hospital, or your health care provider may want you to use it at home. You may need to have a sleep study before your health care provider can order a machine for you to use at home. What are the advantages? CPAP or BIPAP can be helpful if you have: Sleep apnea. Chronic obstructive pulmonary disease (COPD). Heart failure. Medical conditions that cause muscle weakness, including muscular dystrophy or amyotrophic lateral sclerosis (ALS). Other problems that cause breathing to be shallow, weak, abnormal, or difficult. CPAP and  BIPAP are most commonly used for obstructive sleep apnea (OSA) to keep the airways from collapsing when the muscles relax during sleep. What are the risks? Generally, this is a safe treatment. However, problems may occur, including: Irritated skin or skin sores if the mask does not fit properly. Dry or stuffy nose or nosebleeds. Dry mouth. Feeling gassy or bloated. Sinus or lung infection if the equipment is not cleaned properly. When should CPAP or BIPAP be used? In most cases, the mask only needs to be worn during sleep. Generally, the mask needs to be worn throughout the night and during any daytime naps. People with certain medical conditions may also need to wear the mask at other times, such as when they are awake. Follow instructions from your health care provider about when to use the machine. What happens during CPAP or BIPAP?  Both CPAP and BIPAP are provided by a small machine with a flexible plastic tube that attaches to a plastic mask that you wear. Air is blown through the mask into your nose or mouth. The amount of pressure that is used to blow the air can be adjusted on the machine. Your health care provider will set the pressure setting and help you find the best mask for you. Tips for using the mask  Because the mask needs to be snug, some people feel trapped or closed-in (claustrophobic) when first using the mask. If you feel this way, you may need to get used to the mask. One way to do this is to hold the mask loosely over your nose or mouth and then gradually apply the mask more snugly. You can also gradually increase the amount of time that you use the mask. Masks are available in various types and sizes. If your mask does not fit well, talk with your health care provider about getting a different one. Some common types of masks include: Full face masks, which fit over the mouth and nose. Nasal masks, which fit over the nose. Nasal pillow or prong masks, which fit into the  nostrils. If you are using a mask that fits over your nose and you tend to breathe through your mouth, a chin strap may be applied to help keep your mouth closed. Use a skin barrier to protect your skin as told by your health care provider. Some CPAP and BIPAP machines have alarms that may sound if the mask comes off or develops a leak. If you have trouble with the mask, it is very important that you talk with your health care provider about finding a way to make the mask easier to tolerate. Do not stop using the mask. There could be a negative impact on your health if you stop using the mask. Tips for using the machine Place your CPAP or BIPAP machine on a secure table or stand near an electrical outlet. Know where the on/off switch is on the machine. Follow instructions from your health care provider about how to set the pressure on your machine and when you should use it. Do not eat or drink while the CPAP or BIPAP machine is on. Food or fluids could get pushed into your lungs by the pressure of the CPAP or BIPAP. For home use, CPAP and BIPAP machines can be rented or purchased through home health care companies. Many different brands of machines are available. Renting a machine before purchasing may help you find out which particular machine works well for you. Your health insurance company may also decide which machine you may get. Keep the CPAP or BIPAP machine and attachments clean. Ask your health care provider for specific instructions. Check the humidifier if you have a dry stuffy nose or nosebleeds. Make sure it is working correctly. Follow these instructions at home: Take over-the-counter and prescription medicines only as told by your health care provider. Ask if you can take sinus medicine if your sinuses are blocked. Do not use any products that contain nicotine or tobacco. These products include cigarettes, chewing tobacco, and vaping devices, such as e-cigarettes. If you need help  quitting, ask your health care provider. Keep all follow-up visits. This is important. Contact a health care provider if: You have redness or pressure sores on your head, face, mouth, or nose from the mask or head gear. You have trouble using the CPAP or BIPAP machine. You cannot tolerate wearing the CPAP or BIPAP mask. Someone tells you that you snore even when wearing your CPAP or BIPAP. Get help right away if: You have trouble breathing. You feel confused. Summary CPAP and BIPAP are methods that use air pressure to keep your airways open and to help you breathe well. If you have trouble with the mask, it is very important that you talk with your health care provider about finding a way to  make the mask easier to tolerate. Do not stop using the mask. There could be a negative impact to your health if you stop using the mask. Follow instructions from your health care provider about when to use the machine. This information is not intended to replace advice given to you by your health care provider. Make sure you discuss any questions you have with your health care provider. Document Revised: 05/02/2021 Document Reviewed: 09/01/2020 Elsevier Patient Education  [place].

## 2022-07-18 NOTE — Progress Notes (Signed)
SLEEP MEDICINE CLINIC    Provider:  Larey Seat, MD  Primary Care Physician:  Shawnee Knapp, MD [place] [address]     Referring Provider: Shawnee Knapp, Max [address] [place],  [place] [address]          Chief Complaint according to patient   Patient presents with:     New Patient (Initial Visit)     Patient has very limited recall of medical history and understanding of why she is referred to [place].  Pt here today for f/u sleep apnea. Pt has Wynonia Musty machine/DME: [facility]. Patient is still on narcotic pain medication-       HISTORY OF PRESENT ILLNESS:  Diane [name] is a 66 y.o.  African American female patient and is seen on 07/18/2022  in a RV :  It has taken [place] almost 41-monthto come back together and [name] in the meantime to sleep studies.  The first on 15 November 2020 was a home sleep test about home sleep test showed severe apnea to be present there was only a slight accentuation in supine sleep and not that much difference between REM and non-REM sleep.  No prolonged hypoxemia was noted.  Patient done return for CPAP titration in lab and her apnea index was reduced to 0.0.  Even the slightest pressure of 6 cm water pressure did not allow her to eliminate all apnea.  We are we are ordering an auto titration device between 6 and 10 cm water pressure for her with 3 cm expiratory pressure relief heated humidification and a mask of her choice.  [facility] but [place] [facility]  This is something I did not know at the time.  She was fitted with a IV breeze machine and she has not been able to use it as compliantly as I hoped.  The last 30 days were rather sporadically used only 10% over 4 hours and she used the machine 15 out of 30 days.  While she uses the machine however she has an apnea hypopnea index of 1.1/h.  The leak for  air is moderate.   She feels better , stopped having SOB, nocturia 3 time still , insomnia. Waking lately up with headaches again, but hadn't had AM headaches when using it more compliantly.  She is willing to start usig the CPAP every night and 4 hours.        Chief concern :  no concern according to patient. She then admitted to an accidential opiate overdose earlier last year and being told by anesthesiologist that she stopped breathing in the wake up room after Dr. BRolena InfanteLow-Back surgery.  She was referred to pain management.     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  She reports shortness of breath, too. States her husband has never reported her to snore. She has not returned to work since January 2021 after a work related back injury while lifting.  She  has a past medical history of Anxiety, Arthritis, Carpal tunnel syndrome on both sides, Complication of anesthesia, Depression, Fibromyalgia, GERD (gastroesophageal reflux disease), Headache(784.0), Hypertension, Multiple sites of bowel obstruction ([place] (2005), and Uterine fibroid. Dr. BRolena Infanteperformed a surgery  in June, and by September she was seen in ED with narcotic overdose ,  he released her from his care last week.   Sleep relevant medical history: had an opiate overdose, chronic user of narcotics, has chronic pain and chronic headaches interrupting sleep frequently. Nocturia 3 times or more . No cervical neck injuries and no sinus problems reported.  Family medical /sleep history: No other family member on CPAP with OSA.    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 She lives in a household with husband and grand son ( 46). Pets are present, 3 dogs.Tobacco use; none .   ETOH use; rare , high Caffeine intake in form of Coffee(1 cup in AM ) Soda(very day 2-4) Tea  ( 2-4) or energy drinks.    Sleep habits are as follows: The patient's dinner time is between 6-7 PM. The patient goes to bed at 9-10 PM, she struggles to sleep- often until 1-2 in AM, cool, but not quiet and dark with the TV running. Both feel the need for TV on all night.     She continues to sleep for intervals of 1-2 hours, wakes for frequent  bathroom breaks and MOSTLY PAIN _  Chronic INSOMNIA since January 2021 due to back pain.     The preferred sleep position is not named, with the support of 3 pillows. Dreams are reportedly rare.  AM is the usual rise time. The patient wakes up spontaneously.  She reports eating only one meal a day, not feeling refreshed or restored in AM, with symptoms such as dry mouth, morning headaches, and residual fatigue.  Naps are taken infrequently, she can't sleep.    Review of Systems:  07-18-2022:  Out of a complete 14 system review, the patient complains of only the following symptoms, and all other reviewed systems are negative.:  Fatigue, sleepiness , snoring, fragmented sleep, Insomnia was present for years but worse now due to pain.   Photo and phonophobia with migraine. Dr Brigitte Pulse diagnosed "Fibromyalgia" .  Patient denies depression.  Chronic pain, insomnia, tossing and turning, dry mouth with flexaril.    How likely are you to doze in the following situations:  0 = not likely, 1 = slight chance, 2 = moderate chance, 3 = high chance   Sitting and Reading? Watching Television? Sitting inactive in a public place (theater or meeting)? As a passenger in a car for an hour without a break? Lying down in the afternoon when circumstances permit? Sitting and talking to someone? Sitting quietly after lunch without alcohol? In a car, while stopped for a few minutes in traffic?   Total = 7 / 24 points   FSS endorsed at 20/ 63 points.   Social History   Socioeconomic History   Marital status: Married    Spouse name: Diane [name]   Number of children:  4   Years of education: 12   Highest education level: Not on file  Occupational History    Comment: [employer]  Tobacco Use   Smoking status: Former    Years: 8.00    Types: Cigarettes    Quit date: 10/08/1987    Years since quitting: 34.8   Smokeless tobacco: Never  Vaping Use   Vaping Use: Never used  Substance and Sexual Activity   Alcohol use: Yes    Alcohol/week: 0.0 standard drinks of alcohol    Comment: 1 glass of wine occasionally   Drug use: No   Sexual activity: Not  on file  Other Topics Concern   Not on file  Social History Narrative   Pt lives at home with her husband, daughter, and 2 grandchildren.    She has four children    Husband needs high-level of her care due to h/o brain tumor   Drives a city bus   [place] working outside in her yard and gardening.   drinks one cup of coffee daily.    Social Determinants of Health   Financial Resource Strain: Not on file  Food Insecurity: Not on file  Transportation Needs: Not on file  Physical Activity: Not on file  Stress: Not on file  Social Connections: Not on file    Family History  Problem Relation Age of Onset   Healthy Mother     Past Medical History:  Diagnosis Date   Anxiety    Arthritis    Carpal tunnel syndrome on both sides    Complication of anesthesia    "can't stand mask over face." " It makes my head hurt"   Depression    Fibromyalgia    GERD (gastroesophageal reflux disease)    Headache(784.0)    Migraines   Hypertension    Multiple sites of bowel obstruction ([place]) 2005   Uterine fibroid     Past Surgical History:  Procedure Laterality Date   CHOLECYSTECTOMY     COLON RESECTION  1990   endometrial curretage  2005   GANGLION CYST EXCISION Right 2001   hand   HERNIA REPAIR Left    inguinal   LUMBAR LAMINECTOMY/DECOMPRESSION MICRODISCECTOMY Left 03/09/2020   Procedure: Left L5-S1 disectomy;  Surgeon: Melina Schools, MD;  Location: [facility];  Service: Orthopedics;  Laterality: Left;  2 hrs    TUBAL LIGATION       Current Outpatient Medications on File Prior to Visit  Medication Sig Dispense Refill   amLODipine (NORVASC) 5 MG tablet TAKE 1 TABLET (5 MG TOTAL) BY MOUTH DAILY. 90 tablet 0   amLODipine-olmesartan (AZOR) 5-20 MG tablet Take 1 tablet by mouth daily.     cyclobenzaprine (FLEXERIL) 10 MG tablet Take 1 tablet (10 mg total) by mouth 3 (three) times daily as needed for muscle spasms. 30 tablet 0   EMGALITY 120 MG/ML SOAJ Inject 120 mg into the skin every 30 (thirty) days.     gabapentin (NEURONTIN) 300 MG capsule TAKE 2 CAPSULES (600 MG TOTAL) BY MOUTH 3 (THREE) TIMES DAILY. (Patient taking differently: Take 300-600 mg by mouth See admin instructions. Take 300 mg by mouth in the morning and evening and 600 mg at bedtime) 540 capsule 1   HYDROcodone-acetaminophen (NORCO) 10-325 MG tablet Take 1 tablet by mouth every 8 (eight) hours as needed. 42 tablet 0   naproxen (NAPROSYN) 500 MG tablet Take 1 tablet (500 mg total) by mouth 2 (two) times daily. 30 tablet 0   olmesartan (BENICAR) 20 MG tablet TAKE 1 TABLET BY MOUTH EVERY DAY 90 tablet l0   SUMAtriptan (IMITREX) 100 MG tablet Take 100 mg by mouth every 2 (two) hours as needed for migraine. May repeat in 2 hours if headache persists or recurs.     No current facility-administered medications on file prior to visit.    Allergies  Allergen Reactions   Topamax [Topiramate] Hives   Dilaudid [Hydromorphone Hcl] Hives    Physical exam:  Today's Vitals   07/18/22 1432  BP: (!) 145/89  Pulse: 74  Weight: 164 lb (74.4 kg)  Height: '5\' 4"'$  (1.626 m)  Body mass index is 28.15 kg/m.   Wt Readings from Last 3 Encounters:  07/18/22 164 lb (74.4 kg)  06/18/22 165 lb (74.8 kg)  10/26/20 181 lb (82.1 kg)     Ht Readings from Last 3 Encounters:  07/18/22 '5\' 4"'$  (1.626 m)  06/18/22 '5\' 3"'$  (1.6 m)  10/26/20 5' 4.5" (1.638 m)      General: The patient is awake, alert and appears not in acute distress.  She appears defensive.  The patient is groomed. Head: Normocephalic, atraumatic.  Neck is supple. Mallampati 2, deeply red mucosa.  neck circumference:15 inches . Nasal airflow patent.   Retrognathia is  seen.  Dental status: n/a  Cardiovascular:  Regular rate and cardiac rhythm by pulse,  without distended neck veins. Respiratory: Lungs are clear to auscultation.  Skin:  Without evidence of ankle edema, or rash. Trunk: The patient's posture is erect.   Neurologic exam : The patient is awake and alert, oriented to place and time.   Memory subjective described as intact.  Attention span & concentration ability appears normal.  Speech is fluent,  without  dysarthria, dysphonia or aphasia.  Mood and affect are appropriate.   Cranial nerves: no loss of smell or taste reported  Pupils are equal and briskly reactive to light. Funduscopic exam deferred. .  Extraocular movements in vertical and horizontal planes were intact and without nystagmus. No Diplopia. Visual fields by finger perimetry are intact. Vision changes with migraine .  Facial motor strength is symmetric and tongue and uvula move midline.  Neck ROM : rotation, tilt and flexion extension were normal for age and shoulder shrug was symmetrical.      Coordination: The Finger-to-nose maneuver was intact without evidence of ataxia, dysmetria or tremor.   Gait and station: Patient could rise unassisted from a seated position, but braced herself, walked without assistive device.  Stance is of normal width/ base .She moans in pain as she walks.  Toe and heel walk were deferred.  Deep tendon reflexes: in the upper and lower extremities are symmetric and intact.  Babinski response was deferred .      After spending a total time of  45 minutes face to face and additional time for physical and neurologic examination, review of laboratory studies,  personal review of imaging studies, reports and results of other testing and review of referral information /  records as far as provided in visit, I have established the following assessments:  1) The main reason for her sleep disorder is INSOMNIA du to pain, discomfort and some sleep habits - TV in bedroom.  2) her central apnea was the result of narcotic medication , she had overdosed in 2021.   3)HST confirmed  NREM over REM AHI, severity was high-  apnoeic breathing while in anesthesia had been reported. Sleep tests were followed by CPAP auto titration, and initially high compliance. Marland Kitchen   4) migraines and morning headache had decreased under CPAP use.   My Plan is to proceed with:  1) Restart using CPAP compliantly and rv with NP in 60 days to document the compliance and get her new supplies through [facility].    The patient is still taking hydrocodone, and followed by pain clinic.  Melatonin for insomnia.    I do not accept the patient to the general neurology and headache clinic based on her overlapping narcotic therapy and recent overdose.    CC: I will share my notes with my colleage Dr [name].  Electronically signed by: Larey Seat, MD 07/18/2022 2:59 PM  [facility] and [facility] Board certified by The [employer] of Sleep Medicine and Diplomate of the [employer] of Sleep Medicine. Board certified In Neurology through the [place], Fellow of the [employer] of Neurology. Medical Director of [facility].

## 2022-08-01 ENCOUNTER — Other Ambulatory Visit: Payer: Self-pay | Admitting: Family

## 2022-10-16 ENCOUNTER — Ambulatory Visit: Payer: Medicare Other | Admitting: Family Medicine

## 2022-10-16 NOTE — Patient Instructions (Incomplete)
Please continue using your CPAP regularly. While your insurance requires that you use CPAP at least 4 hours each night on 70% of the nights, I recommend, that you not skip any nights and use it throughout the night if you can. Getting used to CPAP and staying with the treatment long term does take time and patience and discipline. Untreated obstructive sleep apnea when it is moderate to severe can have an adverse impact on cardiovascular health and raise her risk for heart disease, arrhythmias, hypertension, congestive heart failure, stroke and diabetes. Untreated obstructive sleep apnea causes sleep disruption, nonrestorative sleep, and sleep deprivation. This can have an impact on your day to day functioning and cause daytime sleepiness and impairment of cognitive function, memory loss, mood disturbance, and problems focussing. Using CPAP regularly can improve these symptoms.  Follow up in
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
## 2022-10-22 ENCOUNTER — Ambulatory Visit (INDEPENDENT_AMBULATORY_CARE_PROVIDER_SITE_OTHER): Payer: Medicare Other | Admitting: Family

## 2022-10-22 DIAGNOSIS — M170 Bilateral primary osteoarthritis of knee: Secondary | ICD-10-CM | POA: Diagnosis not present

## 2022-10-22 DIAGNOSIS — M1712 Unilateral primary osteoarthritis, left knee: Secondary | ICD-10-CM

## 2022-10-22 DIAGNOSIS — M48061 Spinal stenosis, lumbar region without neurogenic claudication: Secondary | ICD-10-CM | POA: Diagnosis not present

## 2022-10-22 MED ORDER — PREDNISONE 50 MG PO TABS: ORAL_TABLET | ORAL | 0 refills | Status: DC

## 2022-10-22 MED ORDER — CYCLOBENZAPRINE HCL 10 MG PO TABS: 10.0000 mg | ORAL_TABLET | Freq: Three times a day (TID) | ORAL | 0 refills | Status: AC | PRN

## 2022-10-22 NOTE — Progress Notes (Signed)
Office Visit Note   Patient: Diane [name]           Date of Birth: 09-09-1956           MRN: [id number] Visit Date: 10/22/2022              Requested by: Shawnee Knapp, MD [place],  [place] [id number] PCP: Shawnee Knapp, MD  Chief Complaint  Patient presents with   Left Knee - Pain   Right Knee - Pain      HPI: The patient is a 67 year old woman who is seen today for complaint of bilateral knee pain she is complaining of medial joint line pain bilaterally she has history of osteoarthritis has done well with Depo-Medrol injections in the past for her pain and mechanical symptoms.  She does also endorse some hip groin and lateral thigh pain on the left  Has been followed by Dr. Rolena Infante for her lumbar spine issues.  Degenerative changes L4-L5  Assessment & Plan: Visit Diagnoses: No diagnosis found.  Plan: Depo-Medrol injection bilateral knees.  Patient tolerated well.  Will also provide a prednisone burst for the lumbar radiculopathy on the left.  Follow-Up Instructions: No follow-ups on file.   Right Knee Exam   Muscle Strength  The patient has normal right knee strength.  Tenderness  The patient is experiencing tenderness in the medial joint line.  Tests  Varus: negative Valgus: negative  Other  Effusion: no effusion present   Left Knee Exam   Muscle Strength  The patient has normal left knee strength.  Tenderness  The patient is experiencing tenderness in the medial joint line.  Tests  Varus: negative Valgus: negative  Other  Effusion: no effusion present   Back Exam   Tenderness  The patient is experiencing tenderness in the lumbar.  Muscle Strength  The patient has normal back strength.  Tests  Straight leg raise right: negative Straight leg raise left: negative      Patient is alert, oriented, no adenopathy, well-dressed, normal affect, normal respiratory effort.   Imaging: No results found. No images are attached to  the encounter.  Labs: Lab Results  Component Value Date   HGBA1C 5.5 03/05/2018   HGBA1C 5.7 (H) 05/08/2017   ESRSEDRATE CANCELED 04/30/2018   ESRSEDRATE 26 03/05/2018   ESRSEDRATE 7 01/12/2018   CRP 7.1 (H) 03/05/2018   CRP 2.7 07/10/2017   REPTSTATUS 06/19/2022 FINAL 06/17/2022   CULT  06/17/2022    NO GROWTH Performed at [facility], [place] [address], [place], [place] [address]    LABORGA NO GROWTH 01/27/2015     Lab Results  Component Value Date   ALBUMIN 4.3 06/18/2022   ALBUMIN 4.2 06/18/2020   ALBUMIN 4.6 03/05/2018    Lab Results  Component Value Date   MG 2.0 01/27/2015   Lab Results  Component Value Date   VD25OH 12.8 (L) 09/10/2018   VD25OH 11.4 (L) 01/12/2018   VD25OH 31.9 09/20/2016    No results found for: "PREALBUMIN"    Latest Ref Rng & Units 06/18/2022    8:05 AM 06/18/2020    1:18 AM 06/18/2020   12:45 AM  CBC EXTENDED  WBC 4.0 - 10.5 K/uL 2.6  5.0    RBC 3.87 - 5.11 MIL/uL 4.27  4.82    Hemoglobin 12.0 - 15.0 g/dL 12.0  13.6  13.6   HCT 36.0 - 46.0 % 35.9  42.2  40.0   Platelets 150 -  400 K/uL 279  373    NEUT# 1.7 - 7.7 K/uL 1.2  3.2    Lymph# 0.7 - 4.0 K/uL 1.1  1.3       There is no height or weight on file to calculate BMI.  Orders:  No orders of the defined types were placed in this encounter.  Meds ordered this encounter  Medications   cyclobenzaprine (FLEXERIL) 10 MG tablet    Sig: Take 1 tablet (10 mg total) by mouth 3 (three) times daily as needed for muscle spasms.    Dispense:  30 tablet    Refill:  0   predniSONE (DELTASONE) 50 MG tablet    Sig: Take one tablet by mouth once daily for 5 days.    Dispense:  5 tablet    Refill:  0     Procedures: Large Joint Inj: bilateral knee on 10/25/2022 8:22 AM Indications: pain Details: 18 G 1.5 in needle, anteromedial approach Consent was given by the patient.      Clinical Data: No additional findings.  ROS:  All other systems negative, except as noted in the  HPI. Review of Systems  Objective: Vital Signs: There were no vitals taken for this visit.  Specialty Comments:  No specialty comments available.  PMFS History: Patient Active Problem List   Diagnosis Date Noted   Obstructive sleep apnea on CPAP 07/18/2022   Bilateral primary osteoarthritis of knee 02/12/2022   Severe obstructive sleep apnea-hypopnea syndrome 12/18/2020   Analgesic overuse headache 10/26/2020   Insomnia secondary to chronic pain 10/26/2020   Poor sleep pattern 10/26/2020   Chronic insomnia 10/26/2020   Overdose of analgesic 10/26/2020   Lumbar disc herniation 03/09/2020   Adrenal nodule ([place]) 09/27/2016   Fibromyalgia 02/01/2016   Arthritis, senescent 02/01/2016   Vitamin D deficiency 02/01/2016   Depression 08/31/2015   Leukopenia 08/31/2015   Polypharmacy 08/31/2015   Migraine headache without aura 12/31/2012   Hypertension 12/31/2012   Insomnia 12/31/2012   Past Medical History:  Diagnosis Date   Anxiety    Arthritis    Carpal tunnel syndrome on both sides    Complication of anesthesia    "can't stand mask over face." " It makes my head hurt"   Depression    Fibromyalgia    GERD (gastroesophageal reflux disease)    Headache(784.0)    Migraines   Hypertension    Multiple sites of bowel obstruction ([place]) 2005   Uterine fibroid     Family History  Problem Relation Age of Onset   Healthy Mother     Past Surgical History:  Procedure Laterality Date   CHOLECYSTECTOMY     COLON RESECTION  1990   endometrial curretage  2005   GANGLION CYST EXCISION Right 2001   hand   HERNIA REPAIR Left    inguinal   LUMBAR LAMINECTOMY/DECOMPRESSION MICRODISCECTOMY Left 03/09/2020   Procedure: Left L5-S1 disectomy;  Surgeon: Melina Schools, MD;  Location: [facility];  Service: Orthopedics;  Laterality: Left;  2 hrs   TUBAL LIGATION     Social History   Occupational History    Comment: [employer]  Tobacco Use   Smoking status: Former    Years: 8.00    Types:  Cigarettes    Quit date: 10/08/1987    Years since quitting: 35.0   Smokeless tobacco: Never  Vaping Use   Vaping Use: Never used  Substance and Sexual Activity   Alcohol use: Yes    Alcohol/week: 0.0 standard drinks of alcohol  Comment: 1 glass of wine occasionally   Drug use: No   Sexual activity: Not on file
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
## 2022-10-23 ENCOUNTER — Ambulatory Visit: Payer: Medicare Other | Admitting: Family Medicine

## 2022-10-23 ENCOUNTER — Encounter: Payer: Self-pay | Admitting: Family Medicine

## 2022-10-23 ENCOUNTER — Telehealth: Payer: Self-pay | Admitting: Family

## 2022-10-23 VITALS — BP 139/80 | HR 87 | Ht 62.0 in | Wt 168.5 lb

## 2022-10-23 DIAGNOSIS — G4733 Obstructive sleep apnea (adult) (pediatric): Secondary | ICD-10-CM | POA: Diagnosis not present

## 2022-10-23 MED ORDER — HYDROCODONE-ACETAMINOPHEN 5-325 MG PO TABS: 1.0000 | ORAL_TABLET | Freq: Four times a day (QID) | ORAL | 0 refills | Status: DC | PRN

## 2022-10-23 NOTE — Telephone Encounter (Signed)
Patient needs a refill on her pain medication and the Pred is giving her a headache. Please advise

## 2022-10-23 NOTE — Addendum Note (Signed)
Addended by: Suzan Slick on: 10/23/2022 04:35 PM   Modules accepted: Orders

## 2022-10-25 ENCOUNTER — Encounter: Payer: Self-pay | Admitting: Family

## 2022-10-25 DIAGNOSIS — M1711 Unilateral primary osteoarthritis, right knee: Secondary | ICD-10-CM

## 2022-11-13 ENCOUNTER — Telehealth: Payer: Self-pay | Admitting: Family Medicine

## 2022-11-13 NOTE — Telephone Encounter (Signed)
Pt is calling. Stated she called DME company today and she was told Amy order CPAP supplies. Pt said Amy was supposed to order a CPAP machine and not supplies. Pt is requesting a call back

## 2022-11-14 DIAGNOSIS — M5416 Radiculopathy, lumbar region: Secondary | ICD-10-CM | POA: Diagnosis not present

## 2022-11-14 NOTE — Telephone Encounter (Signed)
Can you please call pt back and relay Amy's message? Thank you

## 2022-11-14 NOTE — Telephone Encounter (Signed)
Called pt back. Stated she take it over there and give [place] a call back is she need to. Pt said thank you for calling.
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
## 2022-11-14 NOTE — Telephone Encounter (Signed)
Noted, thank you

## 2022-11-18 DIAGNOSIS — G4733 Obstructive sleep apnea (adult) (pediatric): Secondary | ICD-10-CM | POA: Diagnosis not present

## 2022-11-20 NOTE — Telephone Encounter (Signed)
An order was accidentally sent to [facility] for Cpap supplies, pt currently uses [facility]. Message sent to [facility] to cancel those order and a new message was sent to [facility] for Cpap supplies based on visit on 10/23/22.

## 2022-12-03 DIAGNOSIS — G4733 Obstructive sleep apnea (adult) (pediatric): Secondary | ICD-10-CM | POA: Diagnosis not present

## 2022-12-19 DIAGNOSIS — G4733 Obstructive sleep apnea (adult) (pediatric): Secondary | ICD-10-CM | POA: Diagnosis not present

## 2022-12-19 DIAGNOSIS — I10 Essential (primary) hypertension: Secondary | ICD-10-CM | POA: Diagnosis not present

## 2022-12-19 DIAGNOSIS — K5909 Other constipation: Secondary | ICD-10-CM | POA: Diagnosis not present

## 2023-02-05 DIAGNOSIS — F324 Major depressive disorder, single episode, in partial remission: Secondary | ICD-10-CM | POA: Diagnosis not present

## 2023-02-05 DIAGNOSIS — I10 Essential (primary) hypertension: Secondary | ICD-10-CM | POA: Diagnosis not present

## 2023-02-05 DIAGNOSIS — Z9989 Dependence on other enabling machines and devices: Secondary | ICD-10-CM | POA: Diagnosis not present

## 2023-02-05 DIAGNOSIS — E785 Hyperlipidemia, unspecified: Secondary | ICD-10-CM | POA: Diagnosis not present

## 2023-02-19 ENCOUNTER — Encounter: Payer: Self-pay | Admitting: Family

## 2023-02-19 ENCOUNTER — Ambulatory Visit (INDEPENDENT_AMBULATORY_CARE_PROVIDER_SITE_OTHER): Payer: Medicare Other | Admitting: Family

## 2023-02-19 ENCOUNTER — Ambulatory Visit: Payer: Self-pay

## 2023-02-19 ENCOUNTER — Ambulatory Visit (INDEPENDENT_AMBULATORY_CARE_PROVIDER_SITE_OTHER): Payer: Medicare Other | Admitting: Sports Medicine

## 2023-02-19 ENCOUNTER — Other Ambulatory Visit: Payer: Self-pay

## 2023-02-19 DIAGNOSIS — M170 Bilateral primary osteoarthritis of knee: Secondary | ICD-10-CM

## 2023-02-19 DIAGNOSIS — M25552 Pain in left hip: Secondary | ICD-10-CM | POA: Diagnosis not present

## 2023-02-19 MED ORDER — LIDOCAINE HCL 1 % IJ SOLN
4.0000 mL | INTRAMUSCULAR | Status: AC | PRN
  Administered 2023-02-19: 4 mL

## 2023-02-19 MED ORDER — METHYLPREDNISOLONE ACETATE 40 MG/ML IJ SUSP
40.0000 mg | INTRAMUSCULAR | Status: AC | PRN
  Administered 2023-02-19: 40 mg via INTRA_ARTICULAR

## 2023-02-19 NOTE — Progress Notes (Signed)
Office Visit Note   Patient: Diane [name]           Date of Birth: 1956-07-30           MRN: [id number] Visit Date: 02/19/2023              Requested by: Sherren Mocha, MD [address] [place],  [place] [address] PCP: Sherren Mocha, MD  Chief Complaint  Patient presents with   Left Hip - Pain      HPI: The patient is a 67 year old woman who is seen today for 2 separate issues.  She is concerned about left hip and groin pain she has been to multiple providers for her groin pain.  She has had a gynecological workup as well which was negative.  She points to the left groin.  States the pain is worse with weightbearing comes and goes has been ongoing for months to up to even a year now.  She is also seen for history of bilateral knee pain this is chronic she does have osteoarthritis bilaterally has had Depo-Medrol injections in the past with interval relief.  Last injection was January of this year.  Assessment & Plan: Visit Diagnoses:  1. Pain in left hip     Plan: Concern for possible labral tear left hip pain.  Will send for Depo-Medrol injection of the left hip, intra-articular.  Hope this will be diagnostic.  Depo-Medrol injection bilateral knees patient tolerated well she will follow-up for the knees as needed.  Follow-Up Instructions: Return if symptoms worsen or fail to improve.   Right Knee Exam   Tenderness  The patient is experiencing tenderness in the medial joint line.  Range of Motion  The patient has normal right knee ROM.  Tests  Varus: negative Valgus: negative  Other  Effusion: no effusion present   Left Knee Exam   Tenderness  The patient is experiencing tenderness in the medial joint line.  Range of Motion  The patient has normal left knee ROM.  Tests  Varus: negative Valgus: negative  Other  Swelling: mild Effusion: no effusion present   Left Hip Exam   Tenderness  The patient is experiencing no tenderness.   Tests  FABER:  positive  Comments:  Exquisite pain with internal rotation      Patient is alert, oriented, no adenopathy, well-dressed, normal affect, normal respiratory effort.   Imaging: No results found. No images are attached to the encounter.  Labs: Lab Results  Component Value Date   HGBA1C 5.5 03/05/2018   HGBA1C 5.7 (H) 05/08/2017   ESRSEDRATE CANCELED 04/30/2018   ESRSEDRATE 26 03/05/2018   ESRSEDRATE 7 01/12/2018   CRP 7.1 (H) 03/05/2018   CRP 2.7 07/10/2017   REPTSTATUS 06/19/2022 FINAL 06/17/2022   CULT  06/17/2022    NO GROWTH Performed at [facility], [address] [place], [place] [address]    LABORGA NO GROWTH 01/27/2015     Lab Results  Component Value Date   ALBUMIN 4.3 06/18/2022   ALBUMIN 4.2 06/18/2020   ALBUMIN 4.6 03/05/2018    Lab Results  Component Value Date   MG 2.0 01/27/2015   Lab Results  Component Value Date   VD25OH 12.8 (L) 09/10/2018   VD25OH 11.4 (L) 01/12/2018   VD25OH 31.9 09/20/2016    No results found for: "PREALBUMIN"    Latest Ref Rng & Units 06/18/2022    8:05 AM 06/18/2020    1:18 AM 06/18/2020  12:45 AM  CBC EXTENDED  WBC 4.0 - 10.5 K/uL 2.6  5.0    RBC 3.87 - 5.11 MIL/uL 4.27  4.82    Hemoglobin 12.0 - 15.0 g/dL 40.9  81.1  91.4   HCT 36.0 - 46.0 % 35.9  42.2  40.0   Platelets 150 - 400 K/uL 279  373    NEUT# 1.7 - 7.7 K/uL 1.2  3.2    Lymph# 0.7 - 4.0 K/uL 1.1  1.3       There is no height or weight on file to calculate BMI.  Orders:  Orders Placed This Encounter  Procedures   XR HIP UNILAT W OR W/O PELVIS 2-3 VIEWS LEFT   No orders of the defined types were placed in this encounter.    Procedures: No procedures performed  Clinical Data: No additional findings.  ROS:  All other systems negative, except as noted in the HPI. Review of Systems  Objective: Vital Signs: There were no vitals taken for this visit.  Specialty Comments:  No specialty comments available.  PMFS History: Patient  Active Problem List   Diagnosis Date Noted   Obstructive sleep apnea on CPAP 07/18/2022   Bilateral primary osteoarthritis of knee 02/12/2022   Severe obstructive sleep apnea-hypopnea syndrome 12/18/2020   Analgesic overuse headache 10/26/2020   Insomnia secondary to chronic pain 10/26/2020   Poor sleep pattern 10/26/2020   Chronic insomnia 10/26/2020   Overdose of analgesic 10/26/2020   Lumbar disc herniation 03/09/2020   Adrenal nodule ([facility]) 09/27/2016   Fibromyalgia 02/01/2016   Arthritis, senescent 02/01/2016   Vitamin D deficiency 02/01/2016   Depression 08/31/2015   Leukopenia 08/31/2015   Polypharmacy 08/31/2015   Migraine headache without aura 12/31/2012   Hypertension 12/31/2012   Insomnia 12/31/2012   Past Medical History:  Diagnosis Date   Anxiety    Arthritis    Carpal tunnel syndrome on both sides    Complication of anesthesia    "can't stand mask over face." " It makes my head hurt"   Depression    Fibromyalgia    GERD (gastroesophageal reflux disease)    Headache(784.0)    Migraines   Hypertension    Multiple sites of bowel obstruction ([facility]) 2005   Uterine fibroid     Family History  Problem Relation Age of Onset   Healthy Mother     Past Surgical History:  Procedure Laterality Date   CHOLECYSTECTOMY     COLON RESECTION  1990   endometrial curretage  2005   GANGLION CYST EXCISION Right 2001   hand   HERNIA REPAIR Left    inguinal   LUMBAR LAMINECTOMY/DECOMPRESSION MICRODISCECTOMY Left 03/09/2020   Procedure: Left L5-S1 disectomy;  Surgeon: Venita Lick, MD;  Location: [facility] OR;  Service: Orthopedics;  Laterality: Left;  2 hrs   TUBAL LIGATION     Social History   Occupational History    Comment: [employer]  Tobacco Use   Smoking status: Former    Years: 8    Types: Cigarettes    Quit date: 10/08/1987    Years since quitting: 35.3   Smokeless tobacco: Never  Vaping Use   Vaping Use: Never used  Substance and Sexual Activity   Alcohol use: Yes     Alcohol/week: 0.0 standard drinks of alcohol    Comment: 1 glass of wine occasionally   Drug use: No   Sexual activity: Not on file

## 2023-02-19 NOTE — Progress Notes (Signed)
   Procedure Note  Patient: Diane [name]             Date of Birth: Aug 07, 1956           MRN: [id number]             Visit Date: 02/19/2023  Procedures: Visit Diagnoses:  1. Pain in left hip    Large Joint Inj: L hip joint on 02/19/2023 10:39 AM Indications: pain Details: 22 G 3.5 in needle, ultrasound-guided anterior approach Medications: 4 mL lidocaine 1 %; 40 mg methylPREDNISolone acetate 40 MG/ML Outcome: tolerated well, no immediate complications  Procedure: US-guided intra-articular hip injection, left After discussion on risks/benefits/indications and informed verbal consent was obtained, a timeout was performed. Patient was lying supine on exam table. The hip was cleaned with betadine and alcohol swabs. Then utilizing ultrasound guidance, the patient's femoral head and neck junction was identified and subsequently injected with 4:1 lidocaine:depomedrol via an in-plane approach with ultrasound visualization of the injectate administered into the hip joint. Patient tolerated procedure well without immediate complications.  Procedure, treatment alternatives, risks and benefits explained, specific risks discussed. Consent was given by the patient. Immediately prior to procedure a time out was called to verify the correct patient, procedure, equipment, support staff and site/side marked as required. Patient was prepped and draped in the usual sterile fashion.     - I evaluated the patient about 10 minutes post-injection and she had improvement in pain and range of motion - follow-up with [name], PAC as indicated; I am happy to see them as needed  [name], DO Primary Care Sports Medicine Physician  [facility] - Orthopedics  This note was dictated using Dragon naturally speaking software and may contain errors in syntax, spelling, or content which have not been identified prior to signing this note.

## 2023-02-26 DIAGNOSIS — M170 Bilateral primary osteoarthritis of knee: Secondary | ICD-10-CM | POA: Diagnosis not present

## 2023-02-26 DIAGNOSIS — M5136 Other intervertebral disc degeneration, lumbar region: Secondary | ICD-10-CM | POA: Diagnosis not present

## 2023-02-26 DIAGNOSIS — G894 Chronic pain syndrome: Secondary | ICD-10-CM | POA: Diagnosis not present

## 2023-02-26 DIAGNOSIS — M797 Fibromyalgia: Secondary | ICD-10-CM | POA: Diagnosis not present

## 2023-04-18 DIAGNOSIS — R112 Nausea with vomiting, unspecified: Secondary | ICD-10-CM | POA: Diagnosis not present

## 2023-04-18 DIAGNOSIS — R1032 Left lower quadrant pain: Secondary | ICD-10-CM | POA: Diagnosis not present

## 2023-04-21 DIAGNOSIS — R112 Nausea with vomiting, unspecified: Secondary | ICD-10-CM | POA: Diagnosis not present

## 2023-04-21 DIAGNOSIS — K635 Polyp of colon: Secondary | ICD-10-CM | POA: Diagnosis not present

## 2023-04-21 DIAGNOSIS — K3189 Other diseases of stomach and duodenum: Secondary | ICD-10-CM | POA: Diagnosis not present

## 2023-04-21 DIAGNOSIS — K2980 Duodenitis without bleeding: Secondary | ICD-10-CM | POA: Diagnosis not present

## 2023-04-21 DIAGNOSIS — Q438 Other specified congenital malformations of intestine: Secondary | ICD-10-CM | POA: Diagnosis not present

## 2023-04-21 DIAGNOSIS — Z1211 Encounter for screening for malignant neoplasm of colon: Secondary | ICD-10-CM | POA: Diagnosis not present

## 2023-04-21 DIAGNOSIS — K2930 Chronic superficial gastritis without bleeding: Secondary | ICD-10-CM | POA: Diagnosis not present

## 2023-04-21 DIAGNOSIS — R1032 Left lower quadrant pain: Secondary | ICD-10-CM | POA: Diagnosis not present

## 2023-04-23 DIAGNOSIS — K2980 Duodenitis without bleeding: Secondary | ICD-10-CM | POA: Diagnosis not present

## 2023-04-23 DIAGNOSIS — K2930 Chronic superficial gastritis without bleeding: Secondary | ICD-10-CM | POA: Diagnosis not present

## 2023-04-23 DIAGNOSIS — K635 Polyp of colon: Secondary | ICD-10-CM | POA: Diagnosis not present

## 2023-05-05 NOTE — Patient Instructions (Incomplete)
Please continue using your CPAP regularly. While your insurance requires that you use CPAP at least 4 hours each night on 70% of the nights, I recommend, that you not skip any nights and use it throughout the night if you can. Getting used to CPAP and staying with the treatment long term does take time and patience and discipline. Untreated obstructive sleep apnea when it is moderate to severe can have an adverse impact on cardiovascular health and raise her risk for heart disease, arrhythmias, hypertension, congestive heart failure, stroke and diabetes. Untreated obstructive sleep apnea causes sleep disruption, nonrestorative sleep, and sleep deprivation. This can have an impact on your day to day functioning and cause daytime sleepiness and impairment of cognitive function, memory loss, mood disturbance, and problems focussing. Using CPAP regularly can improve these symptoms.  We will update supply orders, today. Consider melatonin, valerian root or ashwagandha over the counter. Try adjusting humidity on machine. Call [facility] if that does not help.   Follow up in 1 year
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
## 2023-05-06 ENCOUNTER — Encounter: Payer: Self-pay | Admitting: Family Medicine

## 2023-05-06 ENCOUNTER — Ambulatory Visit: Payer: Medicare Other | Admitting: Family Medicine

## 2023-05-06 VITALS — BP 129/95 | HR 80 | Ht 63.0 in | Wt 167.0 lb

## 2023-05-06 DIAGNOSIS — G4733 Obstructive sleep apnea (adult) (pediatric): Secondary | ICD-10-CM

## 2023-06-12 DIAGNOSIS — E785 Hyperlipidemia, unspecified: Secondary | ICD-10-CM | POA: Diagnosis not present

## 2023-06-12 DIAGNOSIS — I10 Essential (primary) hypertension: Secondary | ICD-10-CM | POA: Diagnosis not present

## 2023-06-12 DIAGNOSIS — G894 Chronic pain syndrome: Secondary | ICD-10-CM | POA: Diagnosis not present

## 2023-06-12 DIAGNOSIS — M170 Bilateral primary osteoarthritis of knee: Secondary | ICD-10-CM | POA: Diagnosis not present

## 2023-09-29 DIAGNOSIS — G4733 Obstructive sleep apnea (adult) (pediatric): Secondary | ICD-10-CM | POA: Diagnosis not present

## 2023-10-03 ENCOUNTER — Telehealth: Payer: Self-pay | Admitting: Family

## 2023-10-03 NOTE — Telephone Encounter (Signed)
Patient called and wanted to know if you could send a referral to Dr. Shon Baton for her hip injection. CB#[phone]

## 2023-10-06 ENCOUNTER — Other Ambulatory Visit: Payer: Self-pay | Admitting: Orthopedic Surgery

## 2023-10-06 DIAGNOSIS — M25552 Pain in left hip: Secondary | ICD-10-CM

## 2023-10-06 NOTE — Telephone Encounter (Signed)
Referral placed.

## 2023-10-14 ENCOUNTER — Encounter: Payer: Self-pay | Admitting: Family

## 2023-10-14 ENCOUNTER — Ambulatory Visit: Payer: Medicare Other | Admitting: Family

## 2023-10-14 DIAGNOSIS — M170 Bilateral primary osteoarthritis of knee: Secondary | ICD-10-CM

## 2023-10-14 MED ORDER — METHYLPREDNISOLONE ACETATE 40 MG/ML IJ SUSP
40.0000 mg | INTRAMUSCULAR | Status: AC | PRN
Start: 2023-10-14 — End: 2023-10-14
  Administered 2023-10-14: 40 mg via INTRA_ARTICULAR

## 2023-10-14 MED ORDER — LIDOCAINE HCL 1 % IJ SOLN
5.0000 mL | INTRAMUSCULAR | Status: AC | PRN
Start: 2023-10-14 — End: 2023-10-14
  Administered 2023-10-14: 5 mL

## 2023-10-14 NOTE — Progress Notes (Signed)
 Office Visit Note   Patient: Diane [name]           Date of Birth: Apr 23, 1956           MRN: [id number] Visit Date: 10/14/2023              Requested by: Loreli Elyn SAILOR, MD [address] [place],  [place] [address] PCP: Loreli Elyn SAILOR, MD  Chief Complaint  Patient presents with   Left Knee - Follow-up   Right Knee - Follow-up      HPI: The patient is a 68 year old woman who is presents today in follow-up for bilateral knee pain.  She has a history of osteoarthritis locking catching giving way chronic pain bilateral knees these also bother her at night.  She has not had any recent injuries Assessment & Plan: Visit Diagnoses: No diagnosis found.  Plan: Depo-Medrol  injection bilateral knees.  Patient tolerated well.  She will follow-up as needed.  Follow-Up Instructions: No follow-ups on file.   Right Knee Exam   Muscle Strength  The patient has normal right knee strength.  Tenderness  The patient is experiencing tenderness in the medial joint line.  Range of Motion  The patient has normal right knee ROM.  Other  Swelling: mild Effusion: no effusion present   Left Knee Exam   Muscle Strength  The patient has normal left knee strength.  Tenderness  The patient is experiencing tenderness in the medial joint line.  Range of Motion  The patient has normal left knee ROM.  Other  Swelling: mild      Patient is alert, oriented, no adenopathy, well-dressed, normal affect, normal respiratory effort.   Imaging: No results found. No images are attached to the encounter.  Labs: Lab Results  Component Value Date   HGBA1C 5.5 03/05/2018   HGBA1C 5.7 (H) 05/08/2017   ESRSEDRATE CANCELED 04/30/2018   ESRSEDRATE 26 03/05/2018   ESRSEDRATE 7 01/12/2018   CRP 7.1 (H) 03/05/2018   CRP 2.7 07/10/2017   REPTSTATUS 06/19/2022 FINAL 06/17/2022   CULT  06/17/2022    NO GROWTH Performed at [facility], [address] [place], [place] [address]     LABORGA NO GROWTH 01/27/2015     Lab Results  Component Value Date   ALBUMIN 4.3 06/18/2022   ALBUMIN 4.2 06/18/2020   ALBUMIN 4.6 03/05/2018    Lab Results  Component Value Date   MG 2.0 01/27/2015   Lab Results  Component Value Date   VD25OH 12.8 (L) 09/10/2018   VD25OH 11.4 (L) 01/12/2018   VD25OH 31.9 09/20/2016    No results found for: PREALBUMIN    Latest Ref Rng & Units 06/18/2022    8:05 AM 06/18/2020    1:18 AM 06/18/2020   12:45 AM  CBC EXTENDED  WBC 4.0 - 10.5 K/uL 2.6  5.0    RBC 3.87 - 5.11 MIL/uL 4.27  4.82    Hemoglobin 12.0 - 15.0 g/dL 87.9  86.3  86.3   HCT 36.0 - 46.0 % 35.9  42.2  40.0   Platelets 150 - 400 K/uL 279  373    NEUT# 1.7 - 7.7 K/uL 1.2  3.2    Lymph# 0.7 - 4.0 K/uL 1.1  1.3       There is no height or weight on file to calculate BMI.  Orders:  No orders of the defined types were placed in this encounter.  No orders of the defined types were placed in  this encounter.    Procedures: Large Joint Inj: bilateral knee on 10/14/2023 10:58 AM Indications: pain Details: 18 G 1.5 in needle, anteromedial approach Medications (Right): 5 mL lidocaine  1 %; 40 mg methylPREDNISolone  acetate 40 MG/ML Medications (Left): 5 mL lidocaine  1 %; 40 mg methylPREDNISolone  acetate 40 MG/ML Consent was given by the patient.      Clinical Data: No additional findings.  ROS:  All other systems negative, except as noted in the HPI. Review of Systems  Objective: Vital Signs: There were no vitals taken for this visit.  Specialty Comments:  No specialty comments available.  PMFS History: Patient Active Problem List   Diagnosis Date Noted   Obstructive sleep apnea on CPAP 07/18/2022   Bilateral primary osteoarthritis of knee 02/12/2022   Severe obstructive sleep apnea-hypopnea syndrome 12/18/2020   Analgesic overuse headache 10/26/2020   Insomnia secondary to chronic pain 10/26/2020   Poor sleep pattern 10/26/2020   Chronic insomnia  10/26/2020   Overdose of analgesic 10/26/2020   Lumbar disc herniation 03/09/2020   Adrenal nodule (HCC) 09/27/2016   Fibromyalgia 02/01/2016   Arthritis, senescent 02/01/2016   Vitamin D  deficiency 02/01/2016   Depression 08/31/2015   Leukopenia 08/31/2015   Polypharmacy 08/31/2015   Migraine headache without aura 12/31/2012   Hypertension 12/31/2012   Insomnia 12/31/2012   Past Medical History:  Diagnosis Date   Anxiety    Arthritis    Carpal tunnel syndrome on both sides    Complication of anesthesia    can't stand mask over face.  It makes my head hurt   Depression    Fibromyalgia    GERD (gastroesophageal reflux disease)    Headache(784.0)    Migraines   Hypertension    Multiple sites of bowel obstruction (HCC) 2005   Uterine fibroid     Family History  Problem Relation Age of Onset   Healthy Mother     Past Surgical History:  Procedure Laterality Date   CHOLECYSTECTOMY     COLON RESECTION  1990   endometrial curretage  2005   GANGLION CYST EXCISION Right 2001   hand   HERNIA REPAIR Left    inguinal   LUMBAR LAMINECTOMY/DECOMPRESSION MICRODISCECTOMY Left 03/09/2020   Procedure: Left L5-S1 disectomy;  Surgeon: Burnetta Aures, MD;  Location: [facility] OR;  Service: Orthopedics;  Laterality: Left;  2 hrs   TUBAL LIGATION     Social History   Occupational History    Comment: [employer]  Tobacco Use   Smoking status: Former    Current packs/day: 0.00    Types: Cigarettes    Start date: 10/08/1979    Quit date: 10/08/1987    Years since quitting: 36.0   Smokeless tobacco: Never  Vaping Use   Vaping status: Never Used  Substance and Sexual Activity   Alcohol use: Yes    Alcohol/week: 0.0 standard drinks of alcohol    Comment: 1 glass of wine occasionally   Drug use: No   Sexual activity: Not on file

## 2023-10-17 ENCOUNTER — Encounter: Payer: Medicare Other | Admitting: Sports Medicine

## 2023-10-22 ENCOUNTER — Other Ambulatory Visit: Payer: Self-pay

## 2023-10-22 ENCOUNTER — Encounter: Payer: Self-pay | Admitting: Sports Medicine

## 2023-10-22 ENCOUNTER — Ambulatory Visit: Payer: Medicare Other | Admitting: Sports Medicine

## 2023-10-22 DIAGNOSIS — G8929 Other chronic pain: Secondary | ICD-10-CM | POA: Diagnosis not present

## 2023-10-22 DIAGNOSIS — M1612 Unilateral primary osteoarthritis, left hip: Secondary | ICD-10-CM

## 2023-10-22 DIAGNOSIS — M25552 Pain in left hip: Secondary | ICD-10-CM

## 2023-10-22 MED ORDER — METHYLPREDNISOLONE ACETATE 40 MG/ML IJ SUSP
80.0000 mg | INTRAMUSCULAR | Status: AC | PRN
  Administered 2023-10-22: 80 mg via INTRA_ARTICULAR

## 2023-10-22 MED ORDER — LIDOCAINE HCL 1 % IJ SOLN
4.0000 mL | INTRAMUSCULAR | Status: AC | PRN
Start: 2023-10-22 — End: 2023-10-22
  Administered 2023-10-22: 4 mL

## 2023-10-22 NOTE — Progress Notes (Signed)
 Diane [name] - 68 y.o. female MRN [id number]  Date of birth: [birth year]  Office Visit Note: Visit Date: 10/22/2023 PCP: Cranston Dk, MD Referred by: Timothy Ford, MD  Subjective: Chief Complaint  Patient presents with   Left Hip - Pain   HPI: Diane [name] is a pleasant 68 y.o. female who presents today for follow-up of chronic left hip pain.  Back on 02/19/2023, we did perform an ultrasound-guided left hip injection which gave her excellent relief of her pain.  Most of her pain that radiates into the groin and is worse with movement about the hip.  She does have a mild burning pain on the lateral hip as well but this is not as significant.  She is managed by pain management, uses hydrocodone -acetaminophen  5-325 mg twice daily for pain.  She recently was on Lodine 400 mg but stopped this due to side effects.  Lab Results  Component Value Date   HGBA1C 5.5 03/05/2018   Pertinent ROS were reviewed with the patient and found to be negative unless otherwise specified above in HPI.   Assessment & Plan: Visit Diagnoses:  1. Chronic left hip pain   2. Unilateral primary osteoarthritis, left hip   3. Greater trochanteric pain syndrome of left lower extremity    Plan: Impression is acute exacerbation of chronic left hip pain with mild osteoarthritis.  It is possible she has labral involvement as well as she does have arthritis but certainly not advanced.  She received excellent relief of her pain from prior injection, through shared decision making the proceed with ultrasound-guided intra-articular left hip injection.  She may use ice/heat for any postinjection pain.  She will continue her hydrocodone -acetaminophen  5-325 mg twice daily as needed for pain control.  She will discontinue her Lodine 400 mg due to her side effects.  She did have some mild pain over the trochanteric bursa, did discuss safe and 2 weeks this is not improved, she may see us  back or we could consider formal  PT.  Follow-up: Return if symptoms worsen or fail to improve.   Meds & Orders: No orders of the defined types were placed in this encounter.   Orders Placed This Encounter  Procedures   Large Joint Inj   US  Guided Needle Placement - No Linked Charges     Procedures: Large Joint Inj: L hip joint on 10/22/2023 9:29 AM Indications: pain Details: 22 G 3.5 in needle, ultrasound-guided anterior approach Medications: 4 mL lidocaine  1 %; 80 mg methylPREDNISolone  acetate 40 MG/ML Outcome: tolerated well, no immediate complications  Procedure: US -guided intra-articular hip injection, left After discussion on risks/benefits/indications and informed verbal consent was obtained, a timeout was performed. Patient was lying supine on exam table. The hip was cleaned with betadine and alcohol swabs. Then utilizing ultrasound guidance, the patient's femoral head and neck junction was identified and subsequently injected with 4:2 lidocaine :depomedrol via an in-plane approach with ultrasound visualization of the injectate administered into the hip joint. Patient tolerated procedure well without immediate complications.  Procedure, treatment alternatives, risks and benefits explained, specific risks discussed. Consent was given by the patient. Immediately prior to procedure a time out was called to verify the correct patient, procedure, equipment, support staff and site/side marked as required. Patient was prepped and draped in the usual sterile fashion.          Clinical History: No specialty comments available.  She reports that she quit smoking about 36 years ago. Her smoking use included  cigarettes. She started smoking about 44 years ago. She has never used smokeless tobacco. No results for input(s): "HGBA1C", "LABURIC" in the last 8760 hours.  Objective:    Physical Exam  Gen: Well-appearing, in no acute distress; non-toxic CV: Well-perfused. Warm.  Resp: Breathing unlabored on room air; no  wheezing. Psych: Fluid speech in conversation; appropriate affect; normal thought process  Ortho Exam - Left hip: + There is mild TTP over the greater trochanter but worse with deep palpation over the anterior hip/groin.  There is pain with internal logroll without bony restriction.  Positive Stinchfield, positive FADIR test.  There is weakness secondary to pain with resisted hip flexion.  Imaging:  *Independent review and interpretation of left hip x-ray from 02/09/2023 was performed today.  2 views including AP and lateral film were reviewed which show mild osteoarthritic change of the left hip with a small spur off the acetabular rim.  There is otherwise preserved joint space without any acute bony fracture or abnormality otherwise noted.  Past Medical/Family/Surgical/Social History: Medications & Allergies reviewed per EMR, new medications updated. Patient Active Problem List   Diagnosis Date Noted   Obstructive sleep apnea on CPAP 07/18/2022   Bilateral primary osteoarthritis of knee 02/12/2022   Severe obstructive sleep apnea-hypopnea syndrome 12/18/2020   Analgesic overuse headache 10/26/2020   Insomnia secondary to chronic pain 10/26/2020   Poor sleep pattern 10/26/2020   Chronic insomnia 10/26/2020   Overdose of analgesic 10/26/2020   Lumbar disc herniation 03/09/2020   Adrenal nodule (HCC) 09/27/2016   Fibromyalgia 02/01/2016   Arthritis, senescent 02/01/2016   Vitamin D  deficiency 02/01/2016   Depression 08/31/2015   Leukopenia 08/31/2015   Polypharmacy 08/31/2015   Migraine headache without aura 12/31/2012   Hypertension 12/31/2012   Insomnia 12/31/2012   Past Medical History:  Diagnosis Date   Anxiety    Arthritis    Carpal tunnel syndrome on both sides    Complication of anesthesia    "can't stand mask over face." " It makes my head hurt"   Depression    Fibromyalgia    GERD (gastroesophageal reflux disease)    Headache(784.0)    Migraines   Hypertension     Multiple sites of bowel obstruction (HCC) 2005   Uterine fibroid    Family History  Problem Relation Age of Onset   Healthy Mother    Past Surgical History:  Procedure Laterality Date   CHOLECYSTECTOMY     COLON RESECTION  1990   endometrial curretage  2005   GANGLION CYST EXCISION Right 2001   hand   HERNIA REPAIR Left    inguinal   LUMBAR LAMINECTOMY/DECOMPRESSION MICRODISCECTOMY Left 03/09/2020   Procedure: Left L5-S1 disectomy;  Surgeon: Mort Ards, MD;  Location: [facility] OR;  Service: Orthopedics;  Laterality: Left;  2 hrs   TUBAL LIGATION     Social History   Occupational History    Comment: [employer]  Tobacco Use   Smoking status: Former    Current packs/day: 0.00    Types: Cigarettes    Start date: 10/08/1979    Quit date: 10/08/1987    Years since quitting: 36.0   Smokeless tobacco: Never  Vaping Use   Vaping status: Never Used  Substance and Sexual Activity   Alcohol use: Yes    Alcohol/week: 0.0 standard drinks of alcohol    Comment: 1 glass of wine occasionally   Drug use: No   Sexual activity: Not on file

## 2023-12-12 DIAGNOSIS — M170 Bilateral primary osteoarthritis of knee: Secondary | ICD-10-CM | POA: Diagnosis not present

## 2023-12-12 DIAGNOSIS — I10 Essential (primary) hypertension: Secondary | ICD-10-CM | POA: Diagnosis not present

## 2023-12-12 DIAGNOSIS — G894 Chronic pain syndrome: Secondary | ICD-10-CM | POA: Diagnosis not present

## 2023-12-12 DIAGNOSIS — E785 Hyperlipidemia, unspecified: Secondary | ICD-10-CM | POA: Diagnosis not present

## 2023-12-28 DIAGNOSIS — G4733 Obstructive sleep apnea (adult) (pediatric): Secondary | ICD-10-CM | POA: Diagnosis not present

## 2024-01-02 DIAGNOSIS — E785 Hyperlipidemia, unspecified: Secondary | ICD-10-CM | POA: Diagnosis not present

## 2024-01-02 DIAGNOSIS — I10 Essential (primary) hypertension: Secondary | ICD-10-CM | POA: Diagnosis not present

## 2024-03-29 DIAGNOSIS — G4733 Obstructive sleep apnea (adult) (pediatric): Secondary | ICD-10-CM | POA: Diagnosis not present

## 2024-03-30 DIAGNOSIS — I10 Essential (primary) hypertension: Secondary | ICD-10-CM | POA: Diagnosis not present

## 2024-04-05 DIAGNOSIS — E785 Hyperlipidemia, unspecified: Secondary | ICD-10-CM | POA: Diagnosis not present

## 2024-04-05 DIAGNOSIS — F324 Major depressive disorder, single episode, in partial remission: Secondary | ICD-10-CM | POA: Diagnosis not present

## 2024-04-05 DIAGNOSIS — I10 Essential (primary) hypertension: Secondary | ICD-10-CM | POA: Diagnosis not present

## 2024-04-05 DIAGNOSIS — M170 Bilateral primary osteoarthritis of knee: Secondary | ICD-10-CM | POA: Diagnosis not present

## 2024-04-28 DIAGNOSIS — I10 Essential (primary) hypertension: Secondary | ICD-10-CM | POA: Diagnosis not present
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
## 2024-05-04 NOTE — Patient Instructions (Signed)

## 2024-05-04 NOTE — Progress Notes (Unsigned)
 SABRA

## 2024-05-05 ENCOUNTER — Encounter: Payer: Self-pay | Admitting: Family Medicine

## 2024-05-05 ENCOUNTER — Ambulatory Visit: Payer: Medicare Other | Admitting: Family Medicine

## 2024-05-05 VITALS — BP 120/84 | HR 104 | Ht 64.0 in | Wt 176.0 lb

## 2024-05-05 DIAGNOSIS — G894 Chronic pain syndrome: Secondary | ICD-10-CM | POA: Insufficient documentation

## 2024-05-05 DIAGNOSIS — I10 Essential (primary) hypertension: Secondary | ICD-10-CM | POA: Insufficient documentation

## 2024-05-05 DIAGNOSIS — K5909 Other constipation: Secondary | ICD-10-CM | POA: Insufficient documentation

## 2024-05-05 DIAGNOSIS — Z8719 Personal history of other diseases of the digestive system: Secondary | ICD-10-CM | POA: Insufficient documentation

## 2024-05-05 DIAGNOSIS — M179 Osteoarthritis of knee, unspecified: Secondary | ICD-10-CM | POA: Insufficient documentation

## 2024-05-05 DIAGNOSIS — G4733 Obstructive sleep apnea (adult) (pediatric): Secondary | ICD-10-CM | POA: Insufficient documentation

## 2024-05-05 DIAGNOSIS — E785 Hyperlipidemia, unspecified: Secondary | ICD-10-CM | POA: Insufficient documentation

## 2024-05-05 DIAGNOSIS — F324 Major depressive disorder, single episode, in partial remission: Secondary | ICD-10-CM | POA: Insufficient documentation

## 2024-05-05 DIAGNOSIS — G43909 Migraine, unspecified, not intractable, without status migrainosus: Secondary | ICD-10-CM | POA: Insufficient documentation

## 2024-05-05 DIAGNOSIS — M51362 Other intervertebral disc degeneration, lumbar region with discogenic back pain and lower extremity pain: Secondary | ICD-10-CM | POA: Insufficient documentation

## 2024-05-06 DIAGNOSIS — E785 Hyperlipidemia, unspecified: Secondary | ICD-10-CM | POA: Diagnosis not present

## 2024-05-06 DIAGNOSIS — M170 Bilateral primary osteoarthritis of knee: Secondary | ICD-10-CM | POA: Diagnosis not present

## 2024-05-06 DIAGNOSIS — F324 Major depressive disorder, single episode, in partial remission: Secondary | ICD-10-CM | POA: Diagnosis not present

## 2024-05-06 DIAGNOSIS — I10 Essential (primary) hypertension: Secondary | ICD-10-CM | POA: Diagnosis not present

## 2024-05-14 ENCOUNTER — Encounter: Payer: Self-pay | Admitting: Family

## 2024-05-14 ENCOUNTER — Ambulatory Visit: Admitting: Family

## 2024-05-14 DIAGNOSIS — M170 Bilateral primary osteoarthritis of knee: Secondary | ICD-10-CM

## 2024-05-14 MED ORDER — METHYLPREDNISOLONE ACETATE 40 MG/ML IJ SUSP
40.0000 mg | INTRAMUSCULAR | Status: AC | PRN
  Administered 2024-05-14: 40 mg via INTRA_ARTICULAR

## 2024-05-14 MED ORDER — LIDOCAINE HCL 1 % IJ SOLN
5.0000 mL | INTRAMUSCULAR | Status: AC | PRN
  Administered 2024-05-14: 5 mL

## 2024-05-14 NOTE — Progress Notes (Signed)
 Office Visit Note   Patient: Diane [name]           Date of Birth: 04/16/1956           MRN: [id number] Visit Date: 05/14/2024              Requested by: Loreli Elyn SAILOR, MD [address] [place],  [place] [address] PCP: Loreli Elyn SAILOR, MD  Chief Complaint  Patient presents with   Left Knee - Follow-up   Right Knee - Follow-up      HPI: The patient is a 68 year old woman who returns today for bilateral knee pain right worse than left she complains of aching pain which has been gradually worsening over the last several months her previous Depo-Medrol  injections she reports provided good interval relief for several months she has been having giving way denies falls giving way mainly of the right knee  Assessment & Plan: Visit Diagnoses: No diagnosis found.  Plan: Depo-Medrol  injection bilateral knees.  Patient tolerated well.  Follow-Up Instructions: Return in about 3 months (around 08/14/2024), or if symptoms worsen or fail to improve.   Right Knee Exam   Muscle Strength  The patient has normal right knee strength.  Tenderness  The patient is experiencing tenderness in the medial joint line.  Range of Motion  The patient has normal right knee ROM.  Other  Erythema: absent   Left Knee Exam   Muscle Strength  The patient has normal left knee strength.  Tenderness  The patient is experiencing tenderness in the medial joint line.  Range of Motion  The patient has normal left knee ROM.  Other  Erythema: absent Effusion: no effusion present      Patient is alert, oriented, no adenopathy, well-dressed, normal affect, normal respiratory effort.     Imaging: No results found. No images are attached to the encounter.  Labs: Lab Results  Component Value Date   HGBA1C 5.5 03/05/2018   HGBA1C 5.7 (H) 05/08/2017   ESRSEDRATE CANCELED 04/30/2018   ESRSEDRATE 26 03/05/2018   ESRSEDRATE 7 01/12/2018   CRP 7.1 (H) 03/05/2018   CRP 2.7 07/10/2017    REPTSTATUS 06/19/2022 FINAL 06/17/2022   CULT  06/17/2022    NO GROWTH Performed at [facility], [address] [place], [place] [address]    LABORGA NO GROWTH 01/27/2015     Lab Results  Component Value Date   ALBUMIN 4.3 06/18/2022   ALBUMIN 4.2 06/18/2020   ALBUMIN 4.6 03/05/2018    Lab Results  Component Value Date   MG 2.0 01/27/2015   Lab Results  Component Value Date   VD25OH 12.8 (L) 09/10/2018   VD25OH 11.4 (L) 01/12/2018   VD25OH 31.9 09/20/2016    No results found for: PREALBUMIN    Latest Ref Rng & Units 06/18/2022    8:05 AM 06/18/2020    1:18 AM 06/18/2020   12:45 AM  CBC EXTENDED  WBC 4.0 - 10.5 K/uL 2.6  5.0    RBC 3.87 - 5.11 MIL/uL 4.27  4.82    Hemoglobin 12.0 - 15.0 g/dL 87.9  86.3  86.3   HCT 36.0 - 46.0 % 35.9  42.2  40.0   Platelets 150 - 400 K/uL 279  373    NEUT# 1.7 - 7.7 K/uL 1.2  3.2    Lymph# 0.7 - 4.0 K/uL 1.1  1.3       There is no height or weight on file to calculate BMI.  Orders:  No orders of the defined types were placed in this encounter.  No orders of the defined types were placed in this encounter.    Procedures: Large Joint Inj: bilateral knee on 05/14/2024 10:10 AM Indications: pain Details: 18 G 1.5 in needle, anteromedial approach Medications (Right): 5 mL lidocaine  1 %; 40 mg methylPREDNISolone  acetate 40 MG/ML Medications (Left): 5 mL lidocaine  1 %; 40 mg methylPREDNISolone  acetate 40 MG/ML Consent was given by the patient.      Clinical Data: No additional findings.  ROS:  All other systems negative, except as noted in the HPI. Review of Systems  Objective: Vital Signs: There were no vitals taken for this visit.  Specialty Comments:  No specialty comments available.  PMFS History: Patient Active Problem List   Diagnosis Date Noted   Chronic constipation 05/05/2024   Degeneration of intervertebral disc of lumbar region with discogenic back pain and lower extremity pain 05/05/2024    History of gastrointestinal disease 05/05/2024   Hyperlipidemia 05/05/2024   Major depression single episode, in partial remission (HCC) 05/05/2024   Osteoarthritis of knee 05/05/2024   Chronic pain syndrome 05/05/2024   Essential hypertension 05/05/2024   Migraine 05/05/2024   Obstructive sleep apnea syndrome 05/05/2024   Obstructive sleep apnea on CPAP 07/18/2022   Bilateral primary osteoarthritis of knee 02/12/2022   Severe obstructive sleep apnea-hypopnea syndrome 12/18/2020   Analgesic overuse headache 10/26/2020   Insomnia secondary to chronic pain 10/26/2020   Poor sleep pattern 10/26/2020   Chronic insomnia 10/26/2020   Overdose of analgesic 10/26/2020   Lumbar disc herniation 03/09/2020   Adrenal nodule (HCC) 09/27/2016   Fibromyalgia 02/01/2016   Arthritis, senescent 02/01/2016   Vitamin D  deficiency 02/01/2016   Depression 08/31/2015   Leukopenia 08/31/2015   Polypharmacy 08/31/2015   Migraine headache without aura 12/31/2012   Hypertension 12/31/2012   Insomnia 12/31/2012   Past Medical History:  Diagnosis Date   Anxiety    Arthritis    Carpal tunnel syndrome on both sides    Complication of anesthesia    can't stand mask over face.  It makes my head hurt   Depression    Fibromyalgia    GERD (gastroesophageal reflux disease)    Headache(784.0)    Migraines   Hypertension    Multiple sites of bowel obstruction (HCC) 2005   Uterine fibroid     Family History  Problem Relation Age of Onset   Healthy Mother     Past Surgical History:  Procedure Laterality Date   CHOLECYSTECTOMY     COLON RESECTION  1990   endometrial curretage  2005   GANGLION CYST EXCISION Right 2001   hand   HERNIA REPAIR Left    inguinal   LUMBAR LAMINECTOMY/DECOMPRESSION MICRODISCECTOMY Left 03/09/2020   Procedure: Left L5-S1 disectomy;  Surgeon: Burnetta Aures, MD;  Location: [facility] OR;  Service: Orthopedics;  Laterality: Left;  2 hrs   TUBAL LIGATION     Social History    Occupational History    Comment: [employer]  Tobacco Use   Smoking status: Former    Current packs/day: 0.00    Types: Cigarettes    Start date: 10/08/1979    Quit date: 10/08/1987    Years since quitting: 36.6   Smokeless tobacco: Never  Vaping Use   Vaping status: Never Used  Substance and Sexual Activity   Alcohol use: Yes    Alcohol/week: 0.0 standard drinks of alcohol    Comment: 1 glass of wine occasionally  Drug use: No   Sexual activity: Not on file

## 2024-05-28 DIAGNOSIS — I10 Essential (primary) hypertension: Secondary | ICD-10-CM | POA: Diagnosis not present

## 2024-06-06 DIAGNOSIS — E785 Hyperlipidemia, unspecified: Secondary | ICD-10-CM | POA: Diagnosis not present

## 2024-06-06 DIAGNOSIS — F324 Major depressive disorder, single episode, in partial remission: Secondary | ICD-10-CM | POA: Diagnosis not present

## 2024-06-06 DIAGNOSIS — M170 Bilateral primary osteoarthritis of knee: Secondary | ICD-10-CM | POA: Diagnosis not present

## 2024-06-06 DIAGNOSIS — I10 Essential (primary) hypertension: Secondary | ICD-10-CM | POA: Diagnosis not present

## 2024-06-14 DIAGNOSIS — I10 Essential (primary) hypertension: Secondary | ICD-10-CM | POA: Diagnosis not present

## 2024-06-14 DIAGNOSIS — Z0000 Encounter for general adult medical examination without abnormal findings: Secondary | ICD-10-CM | POA: Diagnosis not present

## 2024-06-14 DIAGNOSIS — E785 Hyperlipidemia, unspecified: Secondary | ICD-10-CM | POA: Diagnosis not present

## 2024-06-14 DIAGNOSIS — Z13820 Encounter for screening for osteoporosis: Secondary | ICD-10-CM | POA: Diagnosis not present

## 2024-06-15 ENCOUNTER — Other Ambulatory Visit: Payer: Self-pay | Admitting: Family Medicine

## 2024-06-15 DIAGNOSIS — Z1231 Encounter for screening mammogram for malignant neoplasm of breast: Secondary | ICD-10-CM

## 2024-06-27 DIAGNOSIS — I10 Essential (primary) hypertension: Secondary | ICD-10-CM | POA: Diagnosis not present

## 2024-06-29 ENCOUNTER — Encounter (INDEPENDENT_AMBULATORY_CARE_PROVIDER_SITE_OTHER): Payer: Self-pay

## 2024-07-01 ENCOUNTER — Encounter: Payer: Self-pay | Admitting: Family Medicine

## 2024-07-05 ENCOUNTER — Ambulatory Visit (INDEPENDENT_AMBULATORY_CARE_PROVIDER_SITE_OTHER): Admitting: Physician Assistant

## 2024-07-05 ENCOUNTER — Encounter (INDEPENDENT_AMBULATORY_CARE_PROVIDER_SITE_OTHER): Payer: Self-pay | Admitting: Physician Assistant

## 2024-07-05 VITALS — BP 126/86 | HR 88 | Temp 98.3°F | Ht 64.0 in | Wt 174.0 lb

## 2024-07-05 DIAGNOSIS — H7292 Unspecified perforation of tympanic membrane, left ear: Secondary | ICD-10-CM | POA: Diagnosis not present

## 2024-07-05 NOTE — Progress Notes (Unsigned)
 Dear Dr. Cristopher, Here is my assessment for our mutual Diane [name], Diane Diane [name]. Thank you for allowing me Diane opportunity to care for your Diane [name]. Please do not hesitate to contact me should you have any other questions. Sincerely, Diane Cohen PA-C  Otolaryngology Clinic Note Referring provider: Dr. Cristopher HPI:  Diane Diane [name] is a 68 y.o. female kindly referred by Dr. Cristopher   Diane Diane [name] is a 68 year old female seen in our office for cerumen impaction.  Diane Diane [name] notes over Diane last 2 weeks Diane Diane [name] has had left ear pressure and pain.  Diane Diane [name] feels like Diane Diane [name] has good hearing but notes that Diane Diane [name] daughter feels like Diane Diane [name] cannot hear very well.  Diane Diane [name] denies any drainage, no history of Diane same.  Diane Diane [name] notes that Diane Diane [name] saw Diane Diane [name] primary care provider approximate 1 week ago who attempted cerumen disimpaction with irrigation, Diane Diane [name] notes this caused significant pain.  Diane Diane [name] notes Diane pain has resolved.  Diane Diane [name] feels like Diane Diane [name] hearing is at baseline.   Independent Review of Additional Tests or Records:  None   PMH/Meds/All/SocHx/FamHx/ROS:   Past Medical History:  Diagnosis Date   Anxiety    Arthritis    Carpal tunnel syndrome on both sides    Complication of anesthesia    can't stand mask over face.  It makes my head hurt   Depression    Fibromyalgia    GERD (gastroesophageal reflux disease)    Headache(784.0)    Migraines   Hypertension    Multiple sites of bowel obstruction (HCC) 2005   Uterine fibroid      Past Surgical History:  Procedure Laterality Date   CHOLECYSTECTOMY     COLON RESECTION  1990   endometrial curretage  2005   GANGLION CYST EXCISION Right 2001   hand   HERNIA REPAIR Left    inguinal   LUMBAR LAMINECTOMY/DECOMPRESSION MICRODISCECTOMY Left 03/09/2020   Procedure: Left L5-S1 disectomy;  Surgeon: Diane Aures, MD;  Location: [facility] OR;  Service: Orthopedics;  Laterality: Left;  2 hrs   TUBAL LIGATION      Family History  Problem Relation Age of Onset   Healthy  Mother      Social Connections: Not on file      Current Outpatient Medications:    amitriptyline  (ELAVIL ) 25 MG tablet, Take 25 mg by mouth at bedtime., Disp: , Rfl:    amLODipine  (NORVASC ) 5 MG tablet, TAKE 1 TABLET (5 MG TOTAL) BY MOUTH DAILY., Disp: 90 tablet, Rfl: 0   atorvastatin (LIPITOR) 10 MG tablet, Take 10 mg by mouth daily., Disp: , Rfl:    cyclobenzaprine  (FLEXERIL ) 10 MG tablet, Take 1 tablet (10 mg total) by mouth 3 (three) times daily as needed for muscle spasms., Disp: 30 tablet, Rfl: 0   DULoxetine  (CYMBALTA ) 60 MG capsule, 1 capsule., Disp: , Rfl:    etodolac (LODINE) 400 MG tablet, Take 400 mg by mouth 2 (two) times daily., Disp: , Rfl:    SUMAtriptan  (IMITREX ) 100 MG tablet, Take 100 mg by mouth every 2 (two) hours as needed for migraine. May repeat in 2 hours if headache persists or recurs., Disp: , Rfl:    EMGALITY 120 MG/ML SOAJ, Inject 120 mg into Diane skin every 30 (thirty) days. (Diane [name] not taking: Reported on 07/05/2024), Disp: , Rfl:    gabapentin  (NEURONTIN ) 300 MG capsule, TAKE 2 CAPSULES (600 MG TOTAL) BY MOUTH 3 (THREE) TIMES DAILY. (Diane [name] not taking: Reported on 07/05/2024), Disp: 540 capsule, Rfl: 1   HYDROcodone -acetaminophen  (NORCO) 10-325 MG  tablet, Take 1 tablet by mouth daily as needed., Disp: , Rfl:    naproxen  (NAPROSYN ) 500 MG tablet, Take 1 tablet (500 mg total) by mouth 2 (two) times daily. (Diane [name] not taking: Reported on 07/05/2024), Disp: 30 tablet, Rfl: 0   olmesartan  (BENICAR ) 20 MG tablet, TAKE 1 TABLET BY MOUTH EVERY DAY (Diane [name] not taking: Reported on 07/05/2024), Disp: 90 tablet, Rfl: l0   predniSONE  (DELTASONE ) 50 MG tablet, Take one tablet by mouth once daily for 5 days. (Diane [name] not taking: Reported on 07/05/2024), Disp: 5 tablet, Rfl: 0   Physical Exam:   BP 126/86 (BP Location: Right Arm, Diane [name] Position: Sitting, Cuff Size: Normal)   Pulse 88   Temp 98.3 F (36.8 C) (Oral)   Ht 5' 4 (1.626 m)   Wt 174 lb (78.9 kg)   SpO2 93%    BMI 29.87 kg/m   Pertinent Findings  CN II-XII intact Minimal cerumen in Diane bilateral external auditory canal on Diane left, TM with approximate 5% perforation in Diane anterior middle TM, clean edges, no drainage, left EAC clear, TM intact with well-pneumatized middle ear space Weber 512: equal Rinne 512: AC > BC b/l  Anterior rhinoscopy: Septum midline; bilateral inferior turbinates with minimal hypertrophy No lesions of oral cavity/oropharynx;  No obviously palpable neck masses/lymphadenopathy/thyromegaly No respiratory distress or stridor   Seprately Identifiable Procedures:  None  Impression & Plans:  Diane Diane [name] is a 68 y.o. female with Diane following   TM perforation-  Small perforation of left TM likely secondary to recent cerumen disimpaction.  No signs of infection, hearing is at baseline.  I would like to see Diane Diane [name] back in 4 weeks for repeat evaluation to assure complete resolution.  Diane Diane [name] was given strict return precautions.  Diane Diane [name] verbalized understanding and agreement to today's plan.   - f/u 4 weeks   Thank you for allowing me Diane opportunity to care for your Diane [name]. Please do not hesitate to contact me should you have any other questions.  Sincerely, Diane Cohen PA-C [facility] Phone: [phone] Fax: [phone]  07/05/2024, 11:54 AM

## 2024-07-06 DIAGNOSIS — F324 Major depressive disorder, single episode, in partial remission: Secondary | ICD-10-CM | POA: Diagnosis not present

## 2024-07-06 DIAGNOSIS — I10 Essential (primary) hypertension: Secondary | ICD-10-CM | POA: Diagnosis not present

## 2024-07-06 DIAGNOSIS — E785 Hyperlipidemia, unspecified: Secondary | ICD-10-CM | POA: Diagnosis not present

## 2024-07-06 DIAGNOSIS — M170 Bilateral primary osteoarthritis of knee: Secondary | ICD-10-CM | POA: Diagnosis not present

## 2024-07-08 DIAGNOSIS — M85851 Other specified disorders of bone density and structure, right thigh: Secondary | ICD-10-CM | POA: Diagnosis not present

## 2024-07-08 DIAGNOSIS — R92333 Mammographic heterogeneous density, bilateral breasts: Secondary | ICD-10-CM | POA: Diagnosis not present

## 2024-07-08 DIAGNOSIS — Z13820 Encounter for screening for osteoporosis: Secondary | ICD-10-CM | POA: Diagnosis not present

## 2024-07-08 DIAGNOSIS — M8589 Other specified disorders of bone density and structure, multiple sites: Secondary | ICD-10-CM | POA: Diagnosis not present

## 2024-07-08 DIAGNOSIS — Z1231 Encounter for screening mammogram for malignant neoplasm of breast: Secondary | ICD-10-CM | POA: Diagnosis not present

## 2024-07-16 DIAGNOSIS — D72819 Decreased white blood cell count, unspecified: Secondary | ICD-10-CM | POA: Diagnosis not present

## 2024-08-02 ENCOUNTER — Telehealth (INDEPENDENT_AMBULATORY_CARE_PROVIDER_SITE_OTHER): Payer: Self-pay

## 2024-08-02 NOTE — Telephone Encounter (Signed)
 Pt called and stated she would like to cancel her appointment and reschedule with jeff. I checked and she could do 11/6 at 2pm

## 2024-08-05 ENCOUNTER — Ambulatory Visit (INDEPENDENT_AMBULATORY_CARE_PROVIDER_SITE_OTHER): Admitting: Physician Assistant

## 2024-08-08 NOTE — Progress Notes (Signed)
 [facility] [phone]   [phone]  INITIAL CONSULT NOTE  Patient Care Team: Cristopher Bottcher, NP as PCP - General (Family Medicine) Letha Cancer, MD as Consulting Physician (Physical Medicine and Rehabilitation)  Hematological/Oncological History 01/27/15: WBC 3.2 with normal differential. 02/01/16: WBC 2.9, ANC 1.3 10/04/16: WBC 2.5,  ANC 1.2  03/05/18: CBC normal during work up for polymyalgia rheumatica which also showed normal sed rate, negative ANA and negative RF. CRP slightly elevated to 7.1. 06/18/2022: WBC 2.6 with ANC 1.2 while in ED for abdominal pain. UTI was ruled out, CT AP showed stool burden without acute pathology. Pelvic US  showed uterine fibroids and borderline thickened endometrium. 06/14/2024: WBC 2.8, ANC 1.4 07/16/2024: WBC 2.7, ANC 1.2  08/10/2024: Establish care with [facility]  CHIEF COMPLAINTS/PURPOSE OF CONSULTATION:  Neutropenia/Leukopenia  HISTORY OF PRESENTING ILLNESS:  Diane [name] 68 y.o. female with medical history significant for GERD, headaches, hypertension, anxiety and depression presents to the hematology clinic for evaluation for neutropenia/leukopenia.  She is unaccompanied for this visit.  On exam today, [name] reports that her energy levels are fairly stable without any new concerns for fatigue.  She is able to complete all her ADLs on her own.  Her appetite and weight are unchanged.  She denies any recurrent signs or symptoms of infection including fevers, chills, cough, sinusitis or urinary symptoms.  She does have chronic nausea that is managed with Phenergan  by her PCP.  She denies easy bruising or signs of overt bleeding including hematochezia or melena.  She has no other complaints.  Rest of the 10 point ROS as below.  MEDICAL HISTORY:  Past Medical History:  Diagnosis Date   Anxiety    Arthritis    Carpal tunnel syndrome on both sides    Complication of anesthesia    can't stand  mask over face.  It makes my head hurt   Depression    Fibromyalgia    GERD (gastroesophageal reflux disease)    Headache(784.0)    Migraines   Hypertension    Multiple sites of bowel obstruction (HCC) 2005   Uterine fibroid     SURGICAL HISTORY: Past Surgical History:  Procedure Laterality Date   CHOLECYSTECTOMY     COLON RESECTION  1990   endometrial curretage  2005   GANGLION CYST EXCISION Right 2001   hand   HERNIA REPAIR Left    inguinal   LUMBAR LAMINECTOMY/DECOMPRESSION MICRODISCECTOMY Left 03/09/2020   Procedure: Left L5-S1 disectomy;  Surgeon: Burnetta Aures, MD;  Location: [facility] OR;  Service: Orthopedics;  Laterality: Left;  2 hrs   TUBAL LIGATION      SOCIAL HISTORY: Social History   Socioeconomic History   Marital status: Married    Spouse name: Albino   Number of children: 4   Years of education: 12   Highest education level: Not on file  Occupational History    Comment: [employer]  Tobacco Use   Smoking status: Former    Current packs/day: 0.00    Types: Cigarettes    Start date: 10/08/1979    Quit date: 10/08/1987    Years since quitting: 36.8   Smokeless tobacco: Never  Vaping Use   Vaping status: Never Used  Substance and Sexual Activity   Alcohol use: Not Currently   Drug use: No   Sexual activity: Not on file  Other Topics Concern   Not on file  Social History Narrative   Pt lives at home with her husband, daughter, and  2 grandchildren.    She has four children    Husband needs high-level of her care due to h/o brain tumor   Drives a city bus   [place] working outside in her yard and gardening.   drinks one cup of coffee daily.    Social Drivers of Corporate Investment Banker Strain: Not on file  Food Insecurity: No Food Insecurity (08/10/2024)   Hunger Vital Sign    Worried About Running Out of Food in the Last Year: Never true    Ran Out of Food in the Last Year: Never true  Transportation Needs: No Transportation Needs (08/10/2024)   PRAPARE  - Administrator, Civil Service (Medical): No    Lack of Transportation (Non-Medical): No  Physical Activity: Not on file  Stress: Not on file  Social Connections: Not on file  Intimate Partner Violence: Not At Risk (08/10/2024)   Humiliation, Afraid, Rape, and Kick questionnaire    Fear of Current or Ex-Partner: No    Emotionally Abused: No    Physically Abused: No    Sexually Abused: No    FAMILY HISTORY: Family History  Problem Relation Age of Onset   Healthy Mother    Ovarian cancer Sister     ALLERGIES:  is allergic to topamax [topiramate] and dilaudid  [hydromorphone  hcl].  MEDICATIONS:  Current Outpatient Medications  Medication Sig Dispense Refill   amitriptyline  (ELAVIL ) 25 MG tablet Take 25 mg by mouth at bedtime.     amLODipine  (NORVASC ) 5 MG tablet TAKE 1 TABLET (5 MG TOTAL) BY MOUTH DAILY. 90 tablet 0   atorvastatin (LIPITOR) 10 MG tablet Take 10 mg by mouth daily.     cyclobenzaprine  (FLEXERIL ) 10 MG tablet Take 1 tablet (10 mg total) by mouth 3 (three) times daily as needed for muscle spasms. 30 tablet 0   DULoxetine  (CYMBALTA ) 60 MG capsule 1 capsule.     etodolac (LODINE) 400 MG tablet Take 400 mg by mouth 2 (two) times daily.     HYDROcodone -acetaminophen  (NORCO) 10-325 MG tablet Take 1 tablet by mouth daily as needed.     SUMAtriptan  (IMITREX ) 100 MG tablet Take 100 mg by mouth every 2 (two) hours as needed for migraine. May repeat in 2 hours if headache persists or recurs.     EMGALITY 120 MG/ML SOAJ Inject 120 mg into the skin every 30 (thirty) days. (Patient not taking: Reported on 08/10/2024)     gabapentin  (NEURONTIN ) 300 MG capsule TAKE 2 CAPSULES (600 MG TOTAL) BY MOUTH 3 (THREE) TIMES DAILY. (Patient not taking: Reported on 08/10/2024) 540 capsule 1   naproxen  (NAPROSYN ) 500 MG tablet Take 1 tablet (500 mg total) by mouth 2 (two) times daily. (Patient not taking: Reported on 08/10/2024) 30 tablet 0   olmesartan  (BENICAR ) 20 MG tablet TAKE 1 TABLET  BY MOUTH EVERY DAY (Patient not taking: Reported on 08/10/2024) 90 tablet l0   predniSONE  (DELTASONE ) 50 MG tablet Take one tablet by mouth once daily for 5 days. (Patient not taking: Reported on 08/10/2024) 5 tablet 0   No current facility-administered medications for this visit.    REVIEW OF SYSTEMS:   Constitutional: ( - ) fevers, ( - )  chills , ( - ) night sweats Eyes: ( - ) blurriness of vision, ( - ) double vision, ( - ) watery eyes Ears, nose, mouth, throat, and face: ( - ) mucositis, ( - ) sore throat Respiratory: ( - ) cough, ( - ) dyspnea, ( - )  wheezes Cardiovascular: ( - ) palpitation, ( - ) chest discomfort, ( - ) lower extremity swelling Gastrointestinal:  ( +) nausea, ( - ) heartburn, ( - ) change in bowel habits Skin: ( - ) abnormal skin rashes Lymphatics: ( - ) new lymphadenopathy, ( - ) easy bruising Neurological: ( - ) numbness, ( - ) tingling, ( - ) new weaknesses Behavioral/Psych: ( - ) mood change, ( - ) new changes  All other systems were reviewed with the patient and are negative.  PHYSICAL EXAMINATION: ECOG PERFORMANCE STATUS: 1 - Symptomatic but completely ambulatory  Vitals:   08/10/24 0905  BP: 136/77  Pulse: 86  Resp: 17  Temp: 98.1 F (36.7 C)  SpO2: 96%   Filed Weights   08/10/24 0905  Weight: 175 lb 14.4 oz (79.8 kg)    GENERAL: well appearing female in NAD  SKIN: skin color, texture, turgor are normal, no rashes or significant lesions EYES: conjunctiva are pink and non-injected, sclera clear NECK: supple, non-tender LYMPH:  no palpable lymphadenopathy in the cervical or supraclavicular lymph nodes.  LUNGS: clear to auscultation and percussion with normal breathing effort HEART: regular rate & rhythm and no murmurs and no lower extremity edema Musculoskeletal: no cyanosis of digits and no clubbing  PSYCH: alert & oriented x 3, fluent speech NEURO: no focal motor/sensory deficits  LABORATORY DATA:  I have reviewed the data as listed     Latest Ref Rng & Units 06/18/2022    8:05 AM 06/18/2020    1:18 AM 06/18/2020   12:45 AM  CBC  WBC 4.0 - 10.5 K/uL 2.6  5.0    Hemoglobin 12.0 - 15.0 g/dL 87.9  86.3  86.3   Hematocrit 36.0 - 46.0 % 35.9  42.2  40.0   Platelets 150 - 400 K/uL 279  373         Latest Ref Rng & Units 06/18/2022    8:05 AM 06/18/2020    1:18 AM 06/18/2020   12:45 AM  CMP  Glucose 70 - 99 mg/dL 99  884    BUN 8 - 23 mg/dL 12  18    Creatinine 9.55 - 1.00 mg/dL 9.36  8.33    Sodium 864 - 145 mmol/L 143  139  140   Potassium 3.5 - 5.1 mmol/L 3.6  3.1  3.1   Chloride 98 - 111 mmol/L 106  103    CO2 22 - 32 mmol/L 27  22    Calcium 8.9 - 10.3 mg/dL 9.0  8.9    Total Protein 6.5 - 8.1 g/dL 6.8  7.1    Total Bilirubin 0.3 - 1.2 mg/dL 0.4  0.7    Alkaline Phos 38 - 126 U/L 75  83    AST 15 - 41 U/L 15  33    ALT 0 - 44 U/L 6  19       RADIOGRAPHIC STUDIES: I have personally reviewed the radiological images as listed and agreed with the findings in the report. No results found.  ASSESSMENT & PLAN CATHERYN SLIFER is a 68 y.o. female who presents to the hematology clinic for evaluation for leukopenia/neutropenia.  The differentials for neutropenia include benign ethnic neutropenia, infectious etiology, nutritional etiology, inflammatory etiology, or medication.  The patient does not take any medications known to cause neutropenia.    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  This is a common condition whereby individuals of African or Mediterranean descent tend to have slightly  lower white blood cell counts in the general population.  Hallmark of the syndrome is an ANC between 1000 and 1500 which this patient falls into.  This is of little to no clinical consequence.  This is a diagnosis of exclusion therefore we will do further work-up in order to ensure there is no other etiology.   Assuming none of the studies show any concerning abnormalities, we will see the patient back in 6  months time to continue to monitor.      # Leukopenia/Neutropenia  --Rule out infectious etiology with hepatitis B serologies, hepatitis C serologies, and HIV.  --Nutritional evaluation with  vitamin B12, MMA and folate.  --Inflammatory evaluation with ESR and CRP.   --Repeat CBC and CMP.  Additionally, we will order peripheral blood film.  --Assuming there are no concerning findings on the blood work-up today. we will plan to see the patient back in approximately 6 months time.    No orders of the defined types were placed in this encounter.   All questions were answered. The patient knows to call the clinic with any problems, questions or concerns.  I have spent a total of 60 minutes minutes of face-to-face and non-face-to-face time, preparing to see the patient, obtaining and/or reviewing separately obtained history, performing a medically appropriate examination, counseling and educating the patient, ordering tests/procedures,  documenting clinical information in the electronic health record, independently interpreting results and communicating results to the patient, and care coordination.   Johnston Police, PA-C Department of Hematology/Oncology [facility] at [facility] Phone: [phone]  Patient was seen with Dr. Federico.   I have read the above note and personally examined the patient. I agree with the assessment and plan as noted above.  Briefly Mrs. Diane [name] is a 68 year old female who presents for evaluation of leukopenia.  On 06/18/2022 patient had labs drawn which showed a white blood cell count of 2.6 with ANC of 1.2.  Most recent normal white blood cell count was on 06/18/2020 at which time the white blood cell count was 5.0 with an ANC of 3.2.  At this time findings are consistent with a mild neutropenia.  Will order nutritional studies as well as viral studies.  Patient is not currently taking any medications known to cause low white blood  cell count.  At this time the leukopenia is mild if no clear etiology to be found would recommend routine monitoring.  The patient voiced understanding of our findings and plan moving forward.   Diane IVAR Federico, MD Department of Hematology/Oncology [facility] at [facility] Phone: [phone] Pager: [phone] Email: [email or web address]

## 2024-08-10 ENCOUNTER — Inpatient Hospital Stay

## 2024-08-10 ENCOUNTER — Inpatient Hospital Stay: Attending: Nurse Practitioner | Admitting: Physician Assistant

## 2024-08-10 ENCOUNTER — Encounter: Payer: Self-pay | Admitting: Physician Assistant

## 2024-08-10 VITALS — BP 136/77 | HR 86 | Temp 98.1°F | Resp 17 | Ht 64.0 in | Wt 175.9 lb

## 2024-08-10 DIAGNOSIS — I10 Essential (primary) hypertension: Secondary | ICD-10-CM | POA: Insufficient documentation

## 2024-08-10 DIAGNOSIS — Z79899 Other long term (current) drug therapy: Secondary | ICD-10-CM | POA: Insufficient documentation

## 2024-08-10 DIAGNOSIS — R110 Nausea: Secondary | ICD-10-CM | POA: Diagnosis not present

## 2024-08-10 DIAGNOSIS — D708 Other neutropenia: Secondary | ICD-10-CM | POA: Diagnosis not present

## 2024-08-10 DIAGNOSIS — Z87891 Personal history of nicotine dependence: Secondary | ICD-10-CM | POA: Diagnosis not present

## 2024-08-10 LAB — VITAMIN B12: Vitamin B-12: 414 pg/mL (ref 180–914)

## 2024-08-10 LAB — TECHNOLOGIST SMEAR REVIEW: Plt Morphology: NORMAL

## 2024-08-10 LAB — CMP (CANCER CENTER ONLY)
ALT: 7 U/L (ref 0–44)
AST: 16 U/L (ref 15–41)
Albumin: 4.2 g/dL (ref 3.5–5.0)
Alkaline Phosphatase: 82 U/L (ref 38–126)
Anion gap: 7 (ref 5–15)
BUN: 12 mg/dL (ref 8–23)
CO2: 30 mmol/L (ref 22–32)
Calcium: 9.4 mg/dL (ref 8.9–10.3)
Chloride: 107 mmol/L (ref 98–111)
Creatinine: 0.64 mg/dL (ref 0.44–1.00)
GFR, Estimated: >60 mL/min (ref >60)
Glucose, Bld: 110 mg/dL — ABNORMAL HIGH (ref 70–99)
Potassium: 3.4 mmol/L — ABNORMAL LOW (ref 3.5–5.1)
Sodium: 144 mmol/L (ref 135–145)
Total Bilirubin: 0.4 mg/dL (ref 0.0–1.2)
Total Protein: 7 g/dL (ref 6.5–8.1)

## 2024-08-10 LAB — CBC WITH DIFFERENTIAL (CANCER CENTER ONLY)
Abs Immature Granulocytes: 0 K/uL (ref 0.00–0.07)
Basophils Absolute: 0 K/uL (ref 0.0–0.1)
Basophils Relative: 1 %
Eosinophils Absolute: 0.1 K/uL (ref 0.0–0.5)
Eosinophils Relative: 3 %
HCT: 37.6 % (ref 36.0–46.0)
Hemoglobin: 12.6 g/dL (ref 12.0–15.0)
Immature Granulocytes: 0 %
Lymphocytes Relative: 31 %
Lymphs Abs: 0.9 K/uL (ref 0.7–4.0)
MCH: 28.8 pg (ref 26.0–34.0)
MCHC: 33.5 g/dL (ref 30.0–36.0)
MCV: 85.8 fL (ref 80.0–100.0)
Monocytes Absolute: 0.3 K/uL (ref 0.1–1.0)
Monocytes Relative: 9 %
Neutro Abs: 1.7 K/uL (ref 1.7–7.7)
Neutrophils Relative %: 56 %
Platelet Count: 260 K/uL (ref 150–400)
RBC: 4.38 MIL/uL (ref 3.87–5.11)
RDW: 12.8 % (ref 11.5–15.5)
WBC Count: 3.1 K/uL — ABNORMAL LOW (ref 4.0–10.5)
nRBC: 0 % (ref 0.0–0.2)

## 2024-08-10 LAB — HEPATITIS B SURFACE ANTIBODY,QUALITATIVE: Hep B S Ab: NONREACTIVE

## 2024-08-10 LAB — C-REACTIVE PROTEIN: CRP: 0.6 mg/dL (ref <1.0)

## 2024-08-10 LAB — HEPATITIS C ANTIBODY: HCV Ab: NONREACTIVE

## 2024-08-10 LAB — HEPATITIS B SURFACE ANTIGEN: Hepatitis B Surface Ag: NONREACTIVE

## 2024-08-10 LAB — HIV ANTIBODY (ROUTINE TESTING W REFLEX): HIV Screen 4th Generation wRfx: NONREACTIVE

## 2024-08-10 LAB — FOLATE: Folate: 13.5 ng/mL (ref >5.9)

## 2024-08-10 LAB — SEDIMENTATION RATE: Sed Rate: 9 mm/h (ref 0–22)

## 2024-08-11 LAB — HEPATITIS B CORE ANTIBODY, TOTAL: HEP B CORE AB: NEGATIVE

## 2024-08-12 ENCOUNTER — Ambulatory Visit (INDEPENDENT_AMBULATORY_CARE_PROVIDER_SITE_OTHER): Admitting: Physician Assistant

## 2024-08-16 ENCOUNTER — Telehealth: Payer: Self-pay | Admitting: Physician Assistant

## 2024-08-16 NOTE — Telephone Encounter (Signed)
 I called and spoke to Ms. Diane [name] to review lab results from 08/10/2024. Labs show improvement of leukopenia with WBC 3.1, ANC normal. Remaining workup was negative including no evidence of nutritional deficiencies, hepatitis B/C or HIV. Inflammatory markers were normal.   No further hematological workup is required. Patient will return in 6 months with labs and follow up.

## 2024-08-17 LAB — METHYLMALONIC ACID, SERUM: Methylmalonic Acid, Quantitative: 157 nmol/L (ref 0–378)

## 2024-08-26 DIAGNOSIS — I10 Essential (primary) hypertension: Secondary | ICD-10-CM | POA: Diagnosis not present

## 2024-09-23 ENCOUNTER — Emergency Department (HOSPITAL_COMMUNITY)

## 2024-09-23 ENCOUNTER — Other Ambulatory Visit: Payer: Self-pay

## 2024-09-23 ENCOUNTER — Emergency Department (HOSPITAL_COMMUNITY): Admission: EM | Admit: 2024-09-23 | Source: Home / Self Care

## 2024-09-23 ENCOUNTER — Encounter (HOSPITAL_COMMUNITY): Payer: Self-pay

## 2024-09-23 DIAGNOSIS — R110 Nausea: Secondary | ICD-10-CM | POA: Diagnosis not present

## 2024-09-23 DIAGNOSIS — Z5321 Procedure and treatment not carried out due to patient leaving prior to being seen by health care provider: Secondary | ICD-10-CM | POA: Insufficient documentation

## 2024-09-23 DIAGNOSIS — R1032 Left lower quadrant pain: Secondary | ICD-10-CM | POA: Diagnosis present

## 2024-09-23 LAB — COMPREHENSIVE METABOLIC PANEL WITH GFR
ALT: 8 U/L (ref 0–44)
AST: 29 U/L (ref 15–41)
Albumin: 4.3 g/dL (ref 3.5–5.0)
Alkaline Phosphatase: 82 U/L (ref 38–126)
Anion gap: 16 — ABNORMAL HIGH (ref 5–15)
BUN: 10 mg/dL (ref 8–23)
CO2: 20 mmol/L — ABNORMAL LOW (ref 22–32)
Calcium: 9.5 mg/dL (ref 8.9–10.3)
Chloride: 100 mmol/L (ref 98–111)
Creatinine, Ser: 0.75 mg/dL (ref 0.44–1.00)
GFR, Estimated: >60 mL/min (ref >60)
Glucose, Bld: 110 mg/dL — ABNORMAL HIGH (ref 70–99)
Potassium: 3.4 mmol/L — ABNORMAL LOW (ref 3.5–5.1)
Sodium: 136 mmol/L (ref 135–145)
Total Bilirubin: 0.7 mg/dL (ref 0.0–1.2)
Total Protein: 7 g/dL (ref 6.5–8.1)

## 2024-09-23 LAB — CBC
HCT: 39.5 % (ref 36.0–46.0)
Hemoglobin: 13.2 g/dL (ref 12.0–15.0)
MCH: 28.8 pg (ref 26.0–34.0)
MCHC: 33.4 g/dL (ref 30.0–36.0)
MCV: 86.2 fL (ref 80.0–100.0)
Platelets: 388 K/uL (ref 150–400)
RBC: 4.58 MIL/uL (ref 3.87–5.11)
RDW: 12.5 % (ref 11.5–15.5)
WBC: 12.1 K/uL — ABNORMAL HIGH (ref 4.0–10.5)
nRBC: 0 % (ref 0.0–0.2)

## 2024-09-23 LAB — LIPASE, BLOOD: Lipase: 17 U/L (ref 11–51)

## 2024-09-23 MED ORDER — IOHEXOL 350 MG/ML SOLN
75.0000 mL | Freq: Once | INTRAVENOUS | Status: AC | PRN
  Administered 2024-09-23: 20:00:00 75 mL via INTRAVENOUS

## 2024-09-23 MED ORDER — ONDANSETRON HCL 4 MG/2ML IJ SOLN
4.0000 mg | Freq: Once | INTRAMUSCULAR | Status: AC
  Administered 2024-09-23: 19:00:00 4 mg via INTRAVENOUS
  Filled 2024-09-23: qty 2

## 2024-09-23 MED ORDER — OXYCODONE-ACETAMINOPHEN 5-325 MG PO TABS
1.0000 | ORAL_TABLET | Freq: Once | ORAL | Status: AC
  Administered 2024-09-23: 19:00:00 1 via ORAL
  Filled 2024-09-23: qty 1

## 2024-09-23 NOTE — ED Triage Notes (Addendum)
 Pt reports left side lower abdominal pain all day, reports suddenly got worse. Denies N/V. Reports last BM today   Hx abdominal surgery

## 2024-09-23 NOTE — ED Notes (Signed)
 Pt said her pain is coming back. Told her that the last dose of medication was 3 hours ago and it is not time for more. Md advised that they needed to see patient first.

## 2024-09-23 NOTE — ED Triage Notes (Signed)
 Pt to er, pt was visit someone up in the ICU and had a sudden onset of abd pain and cramping. Pt presents to the er in a wheel chair moaning and holding her abd.  Pt report general pain and abd pain and cramping.

## 2024-09-23 NOTE — ED Provider Triage Note (Signed)
 Emergency Medicine Provider Triage Evaluation Note  Diane [name] , a 68 y.o. female  was evaluated in triage.  Pt complains of acute onset of abdominal pain with nausea without vomiting.  Patient reports history of cholecystectomy.  Pain is left lower quadrant and generalized abdominal pain.  Reports she was seeing her husband and postop room upstairs when she had this insidious onset of severe abdominal pain.  Review of Systems  Positive: Abdominal pain nausea Negative: Chest pain, shortness of breath, syncope, dizziness,  Physical Exam  BP 115/80 (BP Location: Left Arm)   Pulse (!) 128   Temp 98.3 F (36.8 C)   Resp 20   Ht 5' 4 (1.626 m)   Wt 75.8 kg   SpO2 99%   BMI 28.67 kg/m  Gen:   Awake, no distress   Resp:  Normal effort lungs are clear to auscultation all fields MSK:   Moves extremities without difficulty  Other:  Pain to palpation to left lower quadrant of abdomen  Medical Decision Making  Medically screening exam initiated at 6:48 PM.  Appropriate orders placed.  Diane [name] was informed that the remainder of the evaluation will be completed by another provider, this initial triage assessment does not replace that evaluation, and the importance of remaining in the ED until their evaluation is complete.  Patient reports history of uterine fibroids.  Due to symptoms torsion will be ruled out as well with CT abdomen pelvis for intra-abdominal etiologies.   Diane [name], [place] 09/23/24 1850

## 2024-09-24 NOTE — ED Notes (Signed)
 Pt name called to see provider, no response

## 2024-11-19 ENCOUNTER — Ambulatory Visit: Admitting: Family

## 2025-02-09 ENCOUNTER — Inpatient Hospital Stay

## 2025-02-09 ENCOUNTER — Inpatient Hospital Stay: Admitting: Hematology and Oncology

## 2025-05-03 ENCOUNTER — Ambulatory Visit: Admitting: Family Medicine

## 2025-05-05 ENCOUNTER — Ambulatory Visit: Admitting: Family Medicine
